# Patient Record
Sex: Male | Born: 1935 | Race: White | Hispanic: No | Marital: Married | Smoking: Former smoker
Health system: Southern US, Community
[De-identification: ages and names within clinical notes are randomized; demographics above are authoritative.]

## PROBLEM LIST (undated history)

## (undated) DIAGNOSIS — J449 Chronic obstructive pulmonary disease, unspecified: Secondary | ICD-10-CM

## (undated) DIAGNOSIS — R002 Palpitations: Secondary | ICD-10-CM

## (undated) DIAGNOSIS — H04129 Dry eye syndrome of unspecified lacrimal gland: Secondary | ICD-10-CM

## (undated) DIAGNOSIS — I359 Nonrheumatic aortic valve disorder, unspecified: Secondary | ICD-10-CM

## (undated) DIAGNOSIS — H9190 Unspecified hearing loss, unspecified ear: Secondary | ICD-10-CM

## (undated) DIAGNOSIS — I2510 Atherosclerotic heart disease of native coronary artery without angina pectoris: Secondary | ICD-10-CM

## (undated) DIAGNOSIS — I25810 Atherosclerosis of coronary artery bypass graft(s) without angina pectoris: Secondary | ICD-10-CM

## (undated) HISTORY — DX: Dry eye syndrome of unspecified lacrimal gland: H04.129

## (undated) HISTORY — DX: Palpitations: R00.2

## (undated) HISTORY — DX: Chronic obstructive pulmonary disease, unspecified: J44.9

## (undated) HISTORY — DX: Nonrheumatic aortic valve disorder, unspecified: I35.9

## (undated) HISTORY — DX: Atherosclerotic heart disease of native coronary artery without angina pectoris: I25.10

## (undated) HISTORY — DX: Atherosclerosis of coronary artery bypass graft(s) without angina pectoris: I25.810

---

## 1999-10-12 HISTORY — PX: CORONARY ARTERY BYPASS GRAFT: SHX141

## 1999-10-12 HISTORY — PX: AORTIC VALVE REPLACEMENT: SHX41

## 2000-06-28 ENCOUNTER — Ambulatory Visit (HOSPITAL_COMMUNITY): Admission: RE | Admit: 2000-06-28 | Discharge: 2000-06-28 | Payer: Self-pay | Admitting: Cardiovascular Disease

## 2000-07-12 ENCOUNTER — Ambulatory Visit (HOSPITAL_COMMUNITY): Admission: RE | Admit: 2000-07-12 | Discharge: 2000-07-12 | Payer: Self-pay | Admitting: Surgery

## 2000-08-01 ENCOUNTER — Encounter: Payer: Self-pay | Admitting: Surgery

## 2000-08-03 ENCOUNTER — Encounter: Payer: Self-pay | Admitting: Surgery

## 2000-08-03 ENCOUNTER — Inpatient Hospital Stay (HOSPITAL_COMMUNITY): Admission: RE | Admit: 2000-08-03 | Discharge: 2000-08-09 | Payer: Self-pay | Admitting: Surgery

## 2000-08-03 ENCOUNTER — Encounter (INDEPENDENT_AMBULATORY_CARE_PROVIDER_SITE_OTHER): Payer: Self-pay | Admitting: *Deleted

## 2000-08-04 ENCOUNTER — Encounter: Payer: Self-pay | Admitting: Surgery

## 2000-08-05 ENCOUNTER — Encounter: Payer: Self-pay | Admitting: Surgery

## 2003-10-24 ENCOUNTER — Ambulatory Visit (HOSPITAL_COMMUNITY): Admission: RE | Admit: 2003-10-24 | Discharge: 2003-10-24 | Payer: Self-pay | Admitting: Surgery

## 2004-08-12 ENCOUNTER — Ambulatory Visit: Payer: Self-pay | Admitting: *Deleted

## 2004-09-02 ENCOUNTER — Ambulatory Visit: Payer: Self-pay | Admitting: Cardiology

## 2004-09-30 ENCOUNTER — Ambulatory Visit: Payer: Self-pay | Admitting: *Deleted

## 2004-10-28 ENCOUNTER — Ambulatory Visit: Payer: Self-pay | Admitting: Cardiology

## 2004-11-25 ENCOUNTER — Ambulatory Visit: Payer: Self-pay | Admitting: Cardiovascular Disease

## 2004-12-23 ENCOUNTER — Ambulatory Visit: Payer: Self-pay | Admitting: *Deleted

## 2005-01-04 ENCOUNTER — Ambulatory Visit: Payer: Self-pay | Admitting: Cardiology

## 2005-01-20 ENCOUNTER — Ambulatory Visit: Payer: Self-pay | Admitting: Internal Medicine

## 2005-02-17 ENCOUNTER — Ambulatory Visit: Payer: Self-pay | Admitting: Cardiology

## 2005-03-16 ENCOUNTER — Ambulatory Visit: Payer: Self-pay | Admitting: *Deleted

## 2005-03-24 ENCOUNTER — Ambulatory Visit: Payer: Self-pay | Admitting: Cardiovascular Disease

## 2005-04-14 ENCOUNTER — Ambulatory Visit: Payer: Self-pay | Admitting: Internal Medicine

## 2005-05-12 ENCOUNTER — Ambulatory Visit: Payer: Self-pay | Admitting: *Deleted

## 2005-05-12 ENCOUNTER — Ambulatory Visit: Payer: Self-pay | Admitting: Cardiovascular Disease

## 2005-05-18 ENCOUNTER — Ambulatory Visit: Payer: Self-pay | Admitting: Internal Medicine

## 2005-06-10 ENCOUNTER — Ambulatory Visit: Payer: Self-pay | Admitting: Internal Medicine

## 2005-07-06 ENCOUNTER — Ambulatory Visit: Payer: Self-pay | Admitting: *Deleted

## 2005-08-04 ENCOUNTER — Ambulatory Visit: Payer: Self-pay | Admitting: Cardiology

## 2005-08-24 ENCOUNTER — Ambulatory Visit: Payer: Self-pay | Admitting: Internal Medicine

## 2005-09-22 ENCOUNTER — Ambulatory Visit: Payer: Self-pay | Admitting: Cardiology

## 2005-09-28 ENCOUNTER — Encounter: Payer: Self-pay | Admitting: Cardiovascular Disease

## 2005-09-28 ENCOUNTER — Ambulatory Visit: Payer: Self-pay | Admitting: Cardiovascular Disease

## 2005-09-28 ENCOUNTER — Ambulatory Visit: Payer: Self-pay

## 2005-10-21 ENCOUNTER — Ambulatory Visit: Payer: Self-pay | Admitting: Cardiology

## 2005-11-18 ENCOUNTER — Ambulatory Visit: Payer: Self-pay | Admitting: *Deleted

## 2005-12-21 ENCOUNTER — Ambulatory Visit: Payer: Self-pay | Admitting: Cardiology

## 2006-01-05 ENCOUNTER — Ambulatory Visit: Payer: Self-pay | Admitting: *Deleted

## 2006-01-20 ENCOUNTER — Ambulatory Visit: Payer: Self-pay | Admitting: Cardiology

## 2006-02-15 ENCOUNTER — Ambulatory Visit: Payer: Self-pay | Admitting: *Deleted

## 2006-03-16 ENCOUNTER — Ambulatory Visit: Payer: Self-pay | Admitting: Cardiology

## 2006-03-29 ENCOUNTER — Ambulatory Visit: Payer: Self-pay | Admitting: Cardiovascular Disease

## 2006-04-14 ENCOUNTER — Ambulatory Visit: Payer: Self-pay | Admitting: Cardiology

## 2006-04-28 ENCOUNTER — Ambulatory Visit: Payer: Self-pay | Admitting: Cardiology

## 2006-05-10 ENCOUNTER — Ambulatory Visit: Payer: Self-pay | Admitting: Cardiology

## 2006-05-31 ENCOUNTER — Ambulatory Visit: Payer: Self-pay | Admitting: Cardiology

## 2006-06-29 ENCOUNTER — Ambulatory Visit: Payer: Self-pay | Admitting: Cardiology

## 2006-07-19 ENCOUNTER — Ambulatory Visit: Payer: Self-pay | Admitting: Internal Medicine

## 2006-07-26 ENCOUNTER — Ambulatory Visit: Payer: Self-pay | Admitting: Family Medicine

## 2006-07-28 ENCOUNTER — Ambulatory Visit: Payer: Self-pay | Admitting: Internal Medicine

## 2006-08-11 ENCOUNTER — Ambulatory Visit: Payer: Self-pay | Admitting: Cardiology

## 2006-09-06 ENCOUNTER — Ambulatory Visit: Payer: Self-pay | Admitting: Cardiology

## 2006-09-13 ENCOUNTER — Ambulatory Visit: Payer: Self-pay | Admitting: Family Medicine

## 2006-09-29 ENCOUNTER — Ambulatory Visit: Payer: Self-pay | Admitting: Cardiology

## 2006-09-29 ENCOUNTER — Ambulatory Visit: Payer: Self-pay | Admitting: Cardiovascular Disease

## 2006-10-31 ENCOUNTER — Ambulatory Visit: Payer: Self-pay | Admitting: Cardiology

## 2006-11-30 ENCOUNTER — Ambulatory Visit: Payer: Self-pay | Admitting: Cardiovascular Disease

## 2006-12-21 ENCOUNTER — Ambulatory Visit: Payer: Self-pay | Admitting: Cardiology

## 2007-01-18 ENCOUNTER — Ambulatory Visit: Payer: Self-pay | Admitting: Internal Medicine

## 2007-01-26 ENCOUNTER — Ambulatory Visit: Payer: Self-pay | Admitting: Family Medicine

## 2007-02-13 ENCOUNTER — Ambulatory Visit: Payer: Self-pay | Admitting: Cardiovascular Disease

## 2007-03-01 ENCOUNTER — Ambulatory Visit: Payer: Self-pay | Admitting: Cardiology

## 2007-03-09 ENCOUNTER — Ambulatory Visit: Payer: Self-pay | Admitting: Family Medicine

## 2007-03-15 ENCOUNTER — Ambulatory Visit: Payer: Self-pay | Admitting: Cardiology

## 2007-03-29 ENCOUNTER — Ambulatory Visit: Payer: Self-pay | Admitting: Cardiovascular Disease

## 2007-04-12 ENCOUNTER — Ambulatory Visit: Payer: Self-pay | Admitting: Cardiology

## 2007-05-10 ENCOUNTER — Ambulatory Visit: Payer: Self-pay | Admitting: Cardiology

## 2007-06-07 ENCOUNTER — Ambulatory Visit: Payer: Self-pay | Admitting: Cardiology

## 2007-07-05 ENCOUNTER — Ambulatory Visit: Payer: Self-pay | Admitting: Internal Medicine

## 2007-08-30 ENCOUNTER — Ambulatory Visit: Payer: Self-pay | Admitting: Cardiology

## 2007-09-27 ENCOUNTER — Ambulatory Visit: Payer: Self-pay | Admitting: Cardiovascular Disease

## 2007-10-25 ENCOUNTER — Ambulatory Visit: Payer: Self-pay | Admitting: Cardiovascular Disease

## 2007-11-21 ENCOUNTER — Ambulatory Visit: Payer: Self-pay | Admitting: Cardiovascular Disease

## 2007-11-21 LAB — CONVERTED CEMR LAB
Basophils Relative: 0.7 % (ref 0.0–1.0)
Bilirubin, Direct: 0.1 mg/dL (ref 0.0–0.3)
CO2: 26 meq/L (ref 19–32)
Creatinine, Ser: 1.3 mg/dL (ref 0.4–1.5)
Eosinophils Relative: 2.5 % (ref 0.0–5.0)
GFR calc Af Amer: 69 mL/min
Glucose, Bld: 102 mg/dL — ABNORMAL HIGH (ref 70–99)
HCT: 39.1 % (ref 39.0–52.0)
Hemoglobin: 13.3 g/dL (ref 13.0–17.0)
Lymphocytes Relative: 17.5 % (ref 12.0–46.0)
Monocytes Absolute: 0.7 10*3/uL (ref 0.2–0.7)
Monocytes Relative: 8.6 % (ref 3.0–11.0)
Neutro Abs: 5.8 10*3/uL (ref 1.4–7.7)
Neutrophils Relative %: 70.7 % (ref 43.0–77.0)
Potassium: 4.2 meq/L (ref 3.5–5.1)
Sodium: 139 meq/L (ref 135–145)
TSH: 2.96 microintl units/mL (ref 0.35–5.50)
Total Bilirubin: 0.8 mg/dL (ref 0.3–1.2)
Total Protein: 7.1 g/dL (ref 6.0–8.3)
WBC: 8.2 10*3/uL (ref 4.5–10.5)

## 2007-12-20 ENCOUNTER — Ambulatory Visit: Payer: Self-pay | Admitting: Cardiology

## 2008-01-17 ENCOUNTER — Ambulatory Visit: Payer: Self-pay | Admitting: Cardiovascular Disease

## 2008-02-14 ENCOUNTER — Ambulatory Visit: Payer: Self-pay | Admitting: Internal Medicine

## 2008-03-13 ENCOUNTER — Ambulatory Visit: Payer: Self-pay | Admitting: Cardiology

## 2008-04-10 ENCOUNTER — Ambulatory Visit: Payer: Self-pay | Admitting: Internal Medicine

## 2008-05-08 ENCOUNTER — Ambulatory Visit: Payer: Self-pay | Admitting: Cardiology

## 2008-06-10 ENCOUNTER — Ambulatory Visit: Payer: Self-pay | Admitting: Internal Medicine

## 2008-06-10 ENCOUNTER — Ambulatory Visit: Payer: Self-pay | Admitting: Cardiovascular Disease

## 2008-07-10 ENCOUNTER — Ambulatory Visit: Payer: Self-pay | Admitting: Cardiology

## 2008-07-24 ENCOUNTER — Ambulatory Visit: Payer: Self-pay | Admitting: Cardiology

## 2008-08-12 ENCOUNTER — Ambulatory Visit: Payer: Self-pay | Admitting: Cardiovascular Disease

## 2008-09-11 ENCOUNTER — Ambulatory Visit: Payer: Self-pay | Admitting: Internal Medicine

## 2008-10-09 ENCOUNTER — Ambulatory Visit: Payer: Self-pay | Admitting: Internal Medicine

## 2008-10-09 DIAGNOSIS — I25810 Atherosclerosis of coronary artery bypass graft(s) without angina pectoris: Secondary | ICD-10-CM | POA: Insufficient documentation

## 2008-10-09 DIAGNOSIS — Z8679 Personal history of other diseases of the circulatory system: Secondary | ICD-10-CM | POA: Insufficient documentation

## 2008-10-09 DIAGNOSIS — Z951 Presence of aortocoronary bypass graft: Secondary | ICD-10-CM | POA: Insufficient documentation

## 2008-10-09 DIAGNOSIS — J4489 Other specified chronic obstructive pulmonary disease: Secondary | ICD-10-CM | POA: Insufficient documentation

## 2008-10-09 DIAGNOSIS — Z952 Presence of prosthetic heart valve: Secondary | ICD-10-CM

## 2008-10-09 DIAGNOSIS — H04129 Dry eye syndrome of unspecified lacrimal gland: Secondary | ICD-10-CM | POA: Insufficient documentation

## 2008-10-09 DIAGNOSIS — J449 Chronic obstructive pulmonary disease, unspecified: Secondary | ICD-10-CM

## 2008-10-09 HISTORY — DX: Other specified chronic obstructive pulmonary disease: J44.89

## 2008-10-09 HISTORY — DX: Chronic obstructive pulmonary disease, unspecified: J44.9

## 2008-10-09 HISTORY — DX: Atherosclerosis of coronary artery bypass graft(s) without angina pectoris: I25.810

## 2008-10-09 HISTORY — DX: Dry eye syndrome of unspecified lacrimal gland: H04.129

## 2008-11-06 ENCOUNTER — Ambulatory Visit: Payer: Self-pay | Admitting: Cardiology

## 2008-12-03 ENCOUNTER — Ambulatory Visit: Payer: Self-pay | Admitting: Cardiovascular Disease

## 2009-01-01 ENCOUNTER — Ambulatory Visit: Payer: Self-pay | Admitting: Cardiovascular Disease

## 2009-01-01 ENCOUNTER — Ambulatory Visit: Payer: Self-pay | Admitting: Cardiology

## 2009-01-01 ENCOUNTER — Encounter: Payer: Self-pay | Admitting: Cardiovascular Disease

## 2009-01-22 ENCOUNTER — Encounter: Admission: RE | Admit: 2009-01-22 | Discharge: 2009-01-22 | Payer: Self-pay | Admitting: Cardiovascular Disease

## 2009-01-22 ENCOUNTER — Encounter: Payer: Self-pay | Admitting: Cardiovascular Disease

## 2009-01-22 ENCOUNTER — Ambulatory Visit: Payer: Self-pay | Admitting: Internal Medicine

## 2009-01-22 ENCOUNTER — Ambulatory Visit: Payer: Self-pay

## 2009-01-22 DIAGNOSIS — R0989 Other specified symptoms and signs involving the circulatory and respiratory systems: Secondary | ICD-10-CM | POA: Insufficient documentation

## 2009-01-22 DIAGNOSIS — R0609 Other forms of dyspnea: Secondary | ICD-10-CM

## 2009-02-04 ENCOUNTER — Telehealth: Payer: Self-pay | Admitting: Cardiovascular Disease

## 2009-02-19 ENCOUNTER — Ambulatory Visit: Payer: Self-pay | Admitting: Cardiology

## 2009-02-26 ENCOUNTER — Ambulatory Visit: Payer: Self-pay | Admitting: Internal Medicine

## 2009-02-28 ENCOUNTER — Telehealth (INDEPENDENT_AMBULATORY_CARE_PROVIDER_SITE_OTHER): Payer: Self-pay | Admitting: *Deleted

## 2009-03-11 ENCOUNTER — Encounter: Payer: Self-pay | Admitting: *Deleted

## 2009-03-12 ENCOUNTER — Ambulatory Visit: Payer: Self-pay | Admitting: Cardiology

## 2009-03-12 ENCOUNTER — Encounter (INDEPENDENT_AMBULATORY_CARE_PROVIDER_SITE_OTHER): Payer: Self-pay | Admitting: Cardiology

## 2009-03-12 LAB — CONVERTED CEMR LAB: Protime: 22.4

## 2009-03-24 ENCOUNTER — Ambulatory Visit: Payer: Self-pay | Admitting: Internal Medicine

## 2009-04-08 ENCOUNTER — Ambulatory Visit: Payer: Self-pay

## 2009-04-16 ENCOUNTER — Encounter: Payer: Self-pay | Admitting: *Deleted

## 2009-04-22 ENCOUNTER — Telehealth (INDEPENDENT_AMBULATORY_CARE_PROVIDER_SITE_OTHER): Payer: Self-pay | Admitting: *Deleted

## 2009-04-22 DIAGNOSIS — E278 Other specified disorders of adrenal gland: Secondary | ICD-10-CM | POA: Insufficient documentation

## 2009-04-23 ENCOUNTER — Ambulatory Visit: Payer: Self-pay | Admitting: Internal Medicine

## 2009-04-23 ENCOUNTER — Ambulatory Visit: Payer: Self-pay | Admitting: Cardiovascular Disease

## 2009-04-23 DIAGNOSIS — I2510 Atherosclerotic heart disease of native coronary artery without angina pectoris: Secondary | ICD-10-CM

## 2009-04-23 HISTORY — DX: Atherosclerotic heart disease of native coronary artery without angina pectoris: I25.10

## 2009-04-24 LAB — CONVERTED CEMR LAB
BUN: 14 mg/dL (ref 6–23)
Chloride: 107 meq/L (ref 96–112)
GFR calc non Af Amer: 68.99 mL/min (ref >60)
Glucose, Bld: 102 mg/dL — ABNORMAL HIGH (ref 70–99)
Potassium: 4.1 meq/L (ref 3.5–5.1)
Sodium: 140 meq/L (ref 135–145)

## 2009-04-29 ENCOUNTER — Encounter: Admission: RE | Admit: 2009-04-29 | Discharge: 2009-04-29 | Payer: Self-pay | Admitting: Cardiovascular Disease

## 2009-05-21 ENCOUNTER — Ambulatory Visit: Payer: Self-pay | Admitting: Internal Medicine

## 2009-05-21 LAB — CONVERTED CEMR LAB: POC INR: 3.5

## 2009-06-30 ENCOUNTER — Ambulatory Visit: Payer: Self-pay | Admitting: Cardiology

## 2009-07-15 ENCOUNTER — Ambulatory Visit: Payer: Self-pay | Admitting: Cardiovascular Disease

## 2009-07-30 ENCOUNTER — Ambulatory Visit: Payer: Self-pay | Admitting: Internal Medicine

## 2009-07-30 LAB — CONVERTED CEMR LAB: POC INR: 3.6

## 2009-08-27 ENCOUNTER — Ambulatory Visit: Payer: Self-pay | Admitting: Cardiology

## 2009-08-27 LAB — CONVERTED CEMR LAB: POC INR: 3.7

## 2009-09-24 ENCOUNTER — Ambulatory Visit: Payer: Self-pay | Admitting: Cardiology

## 2009-09-24 LAB — CONVERTED CEMR LAB: POC INR: 3

## 2009-10-22 ENCOUNTER — Ambulatory Visit: Payer: Self-pay | Admitting: Internal Medicine

## 2009-11-19 ENCOUNTER — Ambulatory Visit: Payer: Self-pay | Admitting: Cardiology

## 2009-12-17 ENCOUNTER — Ambulatory Visit: Payer: Self-pay | Admitting: Cardiovascular Disease

## 2010-01-14 ENCOUNTER — Ambulatory Visit: Payer: Self-pay | Admitting: Cardiology

## 2010-01-14 ENCOUNTER — Ambulatory Visit: Payer: Self-pay | Admitting: Cardiovascular Disease

## 2010-01-14 LAB — CONVERTED CEMR LAB: POC INR: 2.6

## 2010-02-11 ENCOUNTER — Ambulatory Visit: Payer: Self-pay | Admitting: Cardiology

## 2010-02-11 LAB — CONVERTED CEMR LAB: POC INR: 2.6

## 2010-03-11 ENCOUNTER — Ambulatory Visit: Payer: Self-pay | Admitting: Cardiology

## 2010-03-11 LAB — CONVERTED CEMR LAB: POC INR: 2.6

## 2010-04-08 ENCOUNTER — Ambulatory Visit: Payer: Self-pay | Admitting: Cardiology

## 2010-04-08 LAB — CONVERTED CEMR LAB: POC INR: 3

## 2010-05-05 ENCOUNTER — Ambulatory Visit: Payer: Self-pay | Admitting: Cardiology

## 2010-05-05 LAB — CONVERTED CEMR LAB: POC INR: 3

## 2010-06-03 ENCOUNTER — Ambulatory Visit: Payer: Self-pay | Admitting: Internal Medicine

## 2010-06-03 LAB — CONVERTED CEMR LAB: POC INR: 2.7

## 2010-07-01 ENCOUNTER — Ambulatory Visit: Payer: Self-pay | Admitting: Cardiology

## 2010-07-31 ENCOUNTER — Ambulatory Visit: Payer: Self-pay | Admitting: Cardiovascular Disease

## 2010-07-31 ENCOUNTER — Ambulatory Visit: Payer: Self-pay | Admitting: Internal Medicine

## 2010-07-31 LAB — CONVERTED CEMR LAB: POC INR: 2.5

## 2010-08-19 ENCOUNTER — Encounter: Payer: Self-pay | Admitting: Cardiovascular Disease

## 2010-08-26 ENCOUNTER — Ambulatory Visit: Payer: Self-pay | Admitting: Cardiology

## 2010-09-23 ENCOUNTER — Ambulatory Visit: Payer: Self-pay | Admitting: Cardiology

## 2010-09-23 LAB — CONVERTED CEMR LAB: POC INR: 2.1

## 2010-10-14 ENCOUNTER — Ambulatory Visit: Admission: RE | Admit: 2010-10-14 | Discharge: 2010-10-14 | Payer: Self-pay | Source: Home / Self Care

## 2010-10-14 LAB — CONVERTED CEMR LAB: POC INR: 2.7

## 2010-11-10 NOTE — Medication Information (Signed)
Summary: rov/sp  Anticoagulant Therapy  Managed by: [name], PharmD Referring MD: Charlton Haws MD PCP: Dr. 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 no    Recent/future dental: no  Any missed doses?: no       Is patient compliant with meds? yes       Allergies: 1)  ! Penicillin  Anticoagulation Management History:      The patient is taking warfarin and comes in today for a routine follow up visit.  Positive risk factors for bleeding include an age of 75 years or older.  The bleeding index is 'intermediate risk'.  Positive CHADS2 values include Age > 58 years old.  The start date was 08/04/2000.  Anticoagulation responsible provider: Juanda Chance MD, Smitty Cords.  INR POC: 3.0.  Cuvette Lot#: [id number].  Exp: 07/2011.    Anticoagulation Management Assessment/Plan:      The patient's current anticoagulation dose is Warfarin sodium 5 mg tabs: Use as directed by Anticoagulation Clinic.  The target INR is 2.5 - 3.5.  The next INR is due 06/03/2010.  Anticoagulation instructions were given to patient.  Results were reviewed/authorized by [name], PharmD.  He was notified by Dillard Cannon.         Prior Anticoagulation Instructions: INR 3.0  Continue same dose fo 1 tablet every day except 1 1/2 tablets on Monday, Wednesday and Friday.   Current Anticoagulation Instructions: INR 3.0  Continue same dose of 1 tab daily except for 1.5 tabs on Monday, Wednesday, and Friday.  Re-check in 4 weeks.

## 2010-11-10 NOTE — Medication Information (Signed)
Summary: rov/ewj  Anticoagulant Therapy  Managed by: Cloyde Reams, RN, BSN Referring MD: Charlton Haws MD PCP: Dr. 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 no    Recent/future dental: no  Any missed doses?: no       Is patient compliant with meds? yes       Allergies (verified): 1)  ! Penicillin  Anticoagulation Management History:      The patient is taking warfarin and comes in today for a routine follow up visit.  Positive risk factors for bleeding include an age of 75 years or older.  The bleeding index is 'intermediate risk'.  Positive CHADS2 values include Age > 75 years old.  The start date was 08/04/2000.  Anticoagulation responsible provider: Jens Som MD, Arlys John.  INR POC: 1.9.  Cuvette Lot#: [id number].  Exp: 01/2011.    Anticoagulation Management Assessment/Plan:      The patient's current anticoagulation dose is Warfarin sodium 5 mg tabs: Use as directed by Anticoagulation Clinic.  The target INR is 2.5 - 3.5.  The next INR is due 12/17/2009.  Anticoagulation instructions were given to patient.  Results were reviewed/authorized by Cloyde Reams, RN, BSN.  He was notified by Cloyde Reams RN.         Prior Anticoagulation Instructions: INR 2.7  Continue on same dosage 1 tablet daily except 1.5 tablets on Mondays and Fridays.  Recheck in 4 weeks.    Current Anticoagulation Instructions: INR 1.9  Take 2 tablets today, then start taking 1 tablet daily except 1.5 tablets on Mondays, Wednesdays, and Fridays.  Recheck in 3 weeks.

## 2010-11-10 NOTE — Medication Information (Signed)
Summary: rov/sl  Anticoagulant Therapy  Managed by: [name], PharmD Referring MD: Charlton Haws MD PCP: Dr. 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 no    Recent/future dental: no  Any missed doses?: no       Is patient compliant with meds? yes       Allergies: 1)  ! Penicillin  Anticoagulation Management History:      The patient is taking warfarin and comes in today for a routine follow up visit.  Positive risk factors for bleeding include an age of 69 years or older.  The bleeding index is 'intermediate risk'.  Positive CHADS2 values include Age > 47 years old.  The start date was 08/04/2000.  Anticoagulation responsible Josedaniel Haye: Juanda Chance MD, Smitty Cords.  INR POC: 2.5.  Cuvette Lot#: [id number].  Exp: 08/2011.    Anticoagulation Management Assessment/Plan:      The patient's current anticoagulation dose is Warfarin sodium 5 mg tabs: Use as directed by Anticoagulation Clinic.  The target INR is 2.5 - 3.5.  The next INR is due 07/31/2010.  Anticoagulation instructions were given to patient.  Results were reviewed/authorized by [name], PharmD.  He was notified by Kennieth Francois.         Prior Anticoagulation Instructions: INR 2.7  Continue taking Coumadin 1 tab (5 mg) on Sun, Tues, Thur, Sat and Coumadin 1.5 tabs (7.5 mg) on Mon, Wed, Fri. Return to clinic in 4 weeks.   Current Anticoagulation Instructions: INR 2.5  Continue taking one tablet every day except for one and one-half tablets on Monday, Wednesday, and Friday.  Recheck in four weeks.

## 2010-11-10 NOTE — Assessment & Plan Note (Signed)
Summary: F6M/DM   Referring Sharisa Toves:  Dr. Eden Emms Primary Laterica Matarazzo:  Dr. [name]  CC:  sob.  History of Present Illness: [name] is seen today in F/U for dyspnea, AVR, CAD and anticoagulaiton.  He had a St Jude AVR and SVG to RCA by Dr. Laneta Simmers in 2001.  He has done well.  He has had progressive dyspnea from previous smoking and interstitial lung disease.  he sees Dr. Sherene Sires and has had his inhalers changed.  he needs a flu shor and possibly pneumovax.  He has not had any SSCP, palptitaitons, PND or orthopnea.  His dypnsea is significant but improved.  he is not coughing and has no hemoptysis.  Shelby Dubin follows his coumadin and he has been Rx without bleeding diathesis.  HIs last echo on 01/2009 showed normal LV function and normal AVR with mean gradient and peak gradient of 22 mmHg.  He has pain in the thighs with ambulation which is not claudication as his pulses are excellent.  Should F/U with primary regarding hips.  SOB from interstitial fibrosis is stable  Current Problems (verified): 1)  Pre-operative Cardiovascular Examination  (ICD-V72.81) 2)  Coronary Atherosclerosis Native Coronary Artery  (ICD-414.01) 3)  Adrenal Mass  (ICD-255.8) 4)  Dyspnea  (ICD-786.09) 5)  Coumadin Therapy  (ICD-V58.61) 6)  Atherosclerosis, Hx of  (ICD-V12.50) 7)  Palpitations, Hx of  (ICD-V12.50) 8)  Coronary Artery Bypass Graft, Hx of  (ICD-V45.81) 9)  Dry Eye Syndrome  (ICD-375.15) 10)  COPD  (ICD-496) 11)  Aortic Valve Replacement, Hx of  (ICD-V43.3) 12)  Cad, Artery Bypass Graft  (ICD-414.04)  Current Medications (verified): 1)  Metoprolol Tartrate 50 Mg Tabs (Metoprolol Tartrate) .... 1/2 Tab By Mouth Once Daily 2)  Aspirin 81 Mg  Tabs (Aspirin) .Marland Kitchen.. 1 Tab By Mouth Once Daily 3)  Lumigan 0.03 % Soln (Bimatoprost) .Marland Kitchen.. 1 Drop Both Eyes Qhs 4)  Spiriva Handihaler 18 Mcg  Caps (Tiotropium Bromide Monohydrate) .... One Capsule Each Am Punch It Twice and Take Two Deep Drags 5)  Warfarin Sodium 5  Mg Tabs (Warfarin Sodium) .... Use As Directed By Anticoagulation Clinic 6)  Alphagan P 0.1 % Soln (Brimonidine Tartrate) .Marland Kitchen.. 1 Drop Both Eyes  Allergies (verified): 1)  ! Penicillin  Past History:  Past Medical History: Last updated: 03/24/2009 ATHEROSCLEROSIS, HX OF (ICD-V12.50) PALPITATIONS, HX OF (ICD-V12.50) DRY EYE SYNDROME (ICD-375.15) COPD (ICD-496)    - PFT's 02/26/09  FEV1 1.36 (48%) ratio 29  7% improvment after B2, DLC0 56%    - CT 01/01/09 emphysema with mild pleural thickening. NO ILD CAD, ARTERY BYPASS GRAFT (ICD-414.04) AVR  Past Surgical History: Last updated: 10/09/2008 CABG/AVR:  08/03/2000    OPERATION PERFORMED:  Median sternotomy, extracorporeal circulation, coronary artery bypass graft surgery x 1 using a saphenous vein graft to the right coronary artery and aortic valve replacement using a 23 mm St. Jude mechanical valve. Back surgery 1965  Family History: Last updated: 18-Feb-2009 Father:Died at age 59 of heart attack. Mother: Died at age 88 of stroke  Family History of Hypertension:  Family History of Cancer:  Brother died at age 20 colon cancer. Emphysema- Brother  Social History: Last updated: Feb 18, 2009 Married with children Former smoker.  Quit in 2001.  Smoked "all my life" up to 2 ppd. Activity limited by dyspnea Non-drinker Retired  Review of Systems       Denies fever, malais, weight loss, blurry vision, decreased visual acuity, cough, sputum,  hemoptysis, pleuritic pain, palpitaitons, heartburn, abdominal pain, melena,  lower extremity edema, claudication, or rash.   Vital Signs:  Patient profile:   77 year old male Height:      74 inches Weight:      170 pounds BMI:     21.91 Pulse rate:   64 / minute Resp:     12 per minute BP sitting:   127 / 75  (left arm)  Vitals Entered By: Kem Parkinson (July 31, 2010 10:06 AM)  Physical Exam  General:  Affect appropriate Healthy:  appears stated age HEENT: normal Neck  supple with no adenopathy JVP normal no bruits no thyromegaly Lungs interstitial crackles bilaterally  no wheezing and good diaphragmatic motion Heart:  S1/S2  click with SEM no AR  murmur,rub, gallop or click PMI normal Abdomen: benighn, BS positve, no tenderness, no AAA no bruit.  No HSM or HJR Distal pulses intact with no bruits No edema Neuro non-focal Skin warm and dry    Impression & Recommendations:  Problem # 1:  CORONARY ATHEROSCLEROSIS NATIVE CORONARY ARTERY (ICD-414.01) Stable with no angina.   His updated medication list for this problem includes:    Metoprolol Tartrate 50 Mg Tabs (Metoprolol tartrate) .Marland Kitchen... 1/2 tab by mouth once daily    Aspirin 81 Mg Tabs (Aspirin) .Marland Kitchen... 1 tab by mouth once daily    Warfarin Sodium 5 Mg Tabs (Warfarin sodium) ..... Use as directed by anticoagulation clinic  Problem # 2:  DYSPNEA (ICD-786.09) Interstitial fibrosis Needs flu shot.  F/U Wert His updated medication list for this problem includes:    Metoprolol Tartrate 50 Mg Tabs (Metoprolol tartrate) .Marland Kitchen... 1/2 tab by mouth once daily    Aspirin 81 Mg Tabs (Aspirin) .Marland Kitchen... 1 tab by mouth once daily  Problem # 5:  COUMADIN THERAPY (ICD-V58.61) F/U clinic monthly stable with no bleeding  Problem # 6:  AORTIC VALVE REPLACEMENT, HX OF (ICD-V43.3) Normal exam.  Continue SBE prophylaxis  Patient Instructions: 1)  Your physician recommends that you schedule a follow-up appointment in: 6 MONTHS WITH DR Eden Emms 2)  Your physician recommends that you continue on your current medications as directed. Please refer to the Current Medication list given to you today.   EKG Report  Procedure date:  07/31/2010  Findings:      NSR 64 PAC Nonspecific ST/T changes Otherwise normal

## 2010-11-10 NOTE — Medication Information (Signed)
Summary: rov/sp  Anticoagulant Therapy  Managed by: Elaina Pattee, PharmD Referring MD: Charlton Haws MD PCP: Dr. 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 no    Recent/future dental: no  Any missed doses?: no       Is patient compliant with meds? yes       Allergies: 1)  ! Penicillin  Anticoagulation Management History:      The patient is taking warfarin and comes in today for a routine follow up visit.  Positive risk factors for bleeding include an age of 75 years or older.  The bleeding index is 'intermediate risk'.  Positive CHADS2 values include Age > 80 years old.  The start date was 08/04/2000.  Anticoagulation responsible provider: Riley Kill MD, Maisie Fus.  INR POC: 2.6.  Cuvette Lot#: [id number].  Exp: 05/2011.    Anticoagulation Management Assessment/Plan:      The patient's current anticoagulation dose is Warfarin sodium 5 mg tabs: Use as directed by Anticoagulation Clinic.  The target INR is 2.5 - 3.5.  The next INR is due 04/08/2010.  Anticoagulation instructions were given to patient.  Results were reviewed/authorized by Elaina Pattee, PharmD.  He was notified by Elaina Pattee, PharmD.         Prior Anticoagulation Instructions: INR 2.6  Continue same dose of 1 tablet every day except 1 1/2 tablets on Monday, Wednesday and Friday  Current Anticoagulation Instructions: INR 2.6. Take 1 tablet daily except 1.5 tablets on Mon, Wed, Fri. Recheck in 4 weeks.

## 2010-11-10 NOTE — Medication Information (Signed)
Summary: rov/ewj  Anticoagulant Therapy  Managed by: Cloyde Reams, RN, BSN Referring MD: Charlton Haws MD PCP: Dr. 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    Recent/future hospitalizations: no    Any changes in medication regimen? no    Recent/future dental: no  Any missed doses?: no       Is patient compliant with meds? yes       Allergies (verified): 1)  ! Penicillin  Anticoagulation Management History:      The patient is taking warfarin and comes in today for a routine follow up visit.  Positive risk factors for bleeding include an age of 75 years or older.  The bleeding index is 'intermediate risk'.  Positive CHADS2 values include Age > 41 years old.  The start date was 08/04/2000.  Anticoagulation responsible provider: Eden Emms MD, Theron Arista.  INR POC: 2.5.  Cuvette Lot#: [id number].  Exp: 02/2011.    Anticoagulation Management Assessment/Plan:      The patient's current anticoagulation dose is Warfarin sodium 5 mg tabs: Use as directed by Anticoagulation Clinic.  The target INR is 2.5 - 3.5.  The next INR is due 01/14/2010.  Anticoagulation instructions were given to patient.  Results were reviewed/authorized by Cloyde Reams, RN, BSN.  He was notified by Cloyde Reams RN.         Prior Anticoagulation Instructions: INR 1.9  Take 2 tablets today, then start taking 1 tablet daily except 1.5 tablets on Mondays, Wednesdays, and Fridays.  Recheck in 3 weeks.    Current Anticoagulation Instructions: INR 2.5  Continue on same dosage 1 tablet daily except 1.5 tablets on Mondays, Wednesdays, and  Fridays.  Recheck in 4 weeks.

## 2010-11-10 NOTE — Medication Information (Signed)
Summary: rov/tm  Anticoagulant Therapy  Managed by: Cloyde Reams, RN, BSN Referring MD: Charlton Haws MD PCP: Dr. 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 no    Recent/future dental: no  Any missed doses?: no       Is patient compliant with meds? yes       Allergies (verified): 1)  ! Penicillin  Anticoagulation Management History:      The patient is taking warfarin and comes in today for a routine follow up visit.  Positive risk factors for bleeding include an age of 75 years or older.  The bleeding index is 'intermediate risk'.  Positive CHADS2 values include Age > 31 years old.  The start date was 08/04/2000.  Anticoagulation responsible provider: Eden Emms MD, Theron Arista.  INR POC: 2.7.  Cuvette Lot#: [id number].  Exp: 01/2011.    Anticoagulation Management Assessment/Plan:      The patient's current anticoagulation dose is Warfarin sodium 5 mg tabs: Use as directed by Anticoagulation Clinic.  The target INR is 2.5 - 3.5.  The next INR is due 11/19/2009.  Anticoagulation instructions were given to patient.  Results were reviewed/authorized by Cloyde Reams, RN, BSN.  He was notified by Cloyde Reams RN.         Prior Anticoagulation Instructions: INR 3.0 Continue 1 pill everyday except 1 1/2 pill on Mondays and Fridays. Recheck in 4 weeks.   Current Anticoagulation Instructions: INR 2.7  Continue on same dosage 1 tablet daily except 1.5 tablets on Mondays and Fridays.  Recheck in 4 weeks.

## 2010-11-10 NOTE — Medication Information (Signed)
Summary: rov/ewj  Anticoagulant Therapy  Managed by: Bethena Midget, RN, BSN Referring MD: Charlton Haws MD PCP: Dr. 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 no    Recent/future dental: no  Any missed doses?: no       Is patient compliant with meds? yes       Allergies: 1)  ! Penicillin  Anticoagulation Management History:      The patient is taking warfarin and comes in today for a routine follow up visit.  Positive risk factors for bleeding include an age of 75 years or older.  The bleeding index is 'intermediate risk'.  Positive CHADS2 values include Age > 64 years old.  The start date was 08/04/2000.  Anticoagulation responsible provider: Riley Kill MD, Maisie Fus.  INR POC: 2.6.  Cuvette Lot#: [id number].  Exp: 02/2011.    Anticoagulation Management Assessment/Plan:      The patient's current anticoagulation dose is Warfarin sodium 5 mg tabs: Use as directed by Anticoagulation Clinic.  The target INR is 2.5 - 3.5.  The next INR is due 02/11/2010.  Anticoagulation instructions were given to patient.  Results were reviewed/authorized by Bethena Midget, RN, BSN.  He was notified by Bethena Midget, RN, BSN.         Prior Anticoagulation Instructions: INR 2.5  Continue on same dosage 1 tablet daily except 1.5 tablets on Mondays, Wednesdays, and Fridays.  Recheck in 4 weeks.    Current Anticoagulation Instructions: INR 2.6 Continue 1 pill everyday except 1 1/2 pills on Mondays, Wednesdays and Fridays. Recheck in 4 weeks.

## 2010-11-10 NOTE — Medication Information (Signed)
Summary: rov/ln  Anticoagulant Therapy  Managed by: Reina Fuse, PharmD Referring MD: Charlton Haws MD PCP: Dr. 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 no    Recent/future dental: no  Any missed doses?: no       Is patient compliant with meds? yes       Allergies: 1)  ! Penicillin  Anticoagulation Management History:      The patient is taking warfarin and comes in today for a routine follow up visit.  Positive risk factors for bleeding include an age of 75 years or older.  The bleeding index is 'intermediate risk'.  Positive CHADS2 values include Age > 75 years old.  The start date was 08/04/2000.  Anticoagulation responsible provider: Allred MD, Fayrene Fearing.  INR POC: 2.7.  Cuvette Lot#: [id number].  Exp: 07/2011.    Anticoagulation Management Assessment/Plan:      The patient's current anticoagulation dose is Warfarin sodium 5 mg tabs: Use as directed by Anticoagulation Clinic.  The target INR is 2.5 - 3.5.  The next INR is due 07/01/2010.  Anticoagulation instructions were given to patient.  Results were reviewed/authorized by Reina Fuse, PharmD.  He was notified by Reina Fuse PharmD.         Prior Anticoagulation Instructions: INR 3.0  Continue same dose of 1 tab daily except for 1.5 tabs on Monday, Wednesday, and Friday.  Re-check in 4 weeks.  Current Anticoagulation Instructions: INR 2.7  Continue taking Coumadin 1 tab (5 mg) on Sun, Tues, Thur, Sat and Coumadin 1.5 tabs (7.5 mg) on Mon, Wed, Fri. Return to clinic in 4 weeks.

## 2010-11-10 NOTE — Medication Information (Signed)
Summary: rov/jm  Anticoagulant Therapy  Managed by: [name], PharmD Referring MD: Charlton Haws MD PCP: Dr. 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 no    Recent/future dental: no  Any missed doses?: no       Is patient compliant with meds? yes       Allergies: 1)  ! Penicillin  Anticoagulation Management History:      The patient is taking warfarin and comes in today for a routine follow up visit.  Positive risk factors for bleeding include an age of 75 years or older.  The bleeding index is 'intermediate risk'.  Positive CHADS2 values include Age > 75 years old.  The start date was 08/04/2000.  Anticoagulation responsible provider: Tenny Craw MD, Gunnar Fusi.  INR POC: 2.5.  Cuvette Lot#: [id number].  Exp: 09/2011.    Anticoagulation Management Assessment/Plan:      The patient's current anticoagulation dose is Warfarin sodium 5 mg tabs: Use as directed by Anticoagulation Clinic.  The target INR is 2.5 - 3.5.  The next INR is due 08/26/2010.  Anticoagulation instructions were given to patient.  Results were reviewed/authorized by [name], PharmD.  He was notified by Ilean Skill D candidate.         Prior Anticoagulation Instructions: INR 2.5  Continue taking one tablet every day except for one and one-half tablets on Monday, Wednesday, and Friday.  Recheck in four weeks.    Current Anticoagulation Instructions: INR 2.5  Continue taking same dose of 1 tablet everyday except 1 1/2 tablets on Monday, Wednesday, and Friday. Recheck in 4 weeks.

## 2010-11-10 NOTE — Medication Information (Signed)
Summary: [facility]  Anticoagulant Therapy  Managed by: Lyna Poser, PharmD Referring MD: Charlton Haws MD PCP: Dr. 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 no    Recent/future dental: no  Any missed doses?: no       Is patient compliant with meds? yes       Allergies: 1)  ! Penicillin  Anticoagulation Management History:      Positive risk factors for bleeding include an age of 75 years or older.  The bleeding index is 'intermediate risk'.  Positive CHADS2 values include Age > 62 years old.  The start date was 08/04/2000.  Anticoagulation responsible Jendaya Gossett: Tenny Craw MD, Gunnar Fusi.  INR POC: 2.7.  Exp: 09/2011.    Anticoagulation Management Assessment/Plan:      The patient's current anticoagulation dose is Warfarin sodium 5 mg tabs: Use as directed by Anticoagulation Clinic.  The target INR is 2.5 - 3.5.  The next INR is due 09/23/2010.  Anticoagulation instructions were given to patient.  Results were reviewed/authorized by Lyna Poser, PharmD.         Prior Anticoagulation Instructions: INR 2.5  Continue taking same dose of 1 tablet everyday except 1 1/2 tablets on Monday, Wednesday, and Friday. Recheck in 4 weeks.   Current Anticoagulation Instructions: INR 2.7 Continue taking 1 1/2 tablets on monday, wednesday, and friday. And 1 tablet all other days. Recheck in 4 weeks.

## 2010-11-10 NOTE — Medication Information (Signed)
Summary: rov/cb/pt has an eye appt. at 11/sl  Anticoagulant Therapy  Managed by: [name], PharmD Referring MD: Charlton Haws MD PCP: Dr. 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 Site: [address] INR POC 3.0 INR RANGE 2.5 - 3.5  Dietary changes: no    Health status changes: yes       Details: c/o of high and low BP readings.  This morning BP was somewhere around 117/55, wife reports SBP as high as 190 at night.  Pt has taken metoprolol today.  BP was normal in office today.  Instructed pt to call if he has any future problems and write BP readings down.   Bleeding/hemorrhagic complications: no    Recent/future hospitalizations: no    Any changes in medication regimen? no    Recent/future dental: no  Any missed doses?: no       Is patient compliant with meds? yes       Allergies: 1)  ! Penicillin  Anticoagulation Management History:      The patient is taking warfarin and comes in today for a routine follow up visit.  Positive risk factors for bleeding include an age of 75 years or older.  The bleeding index is 'intermediate risk'.  Positive CHADS2 values include Age > 75 years old.  The start date was 08/04/2000.  Anticoagulation responsible provider: Riley Kill MD, Maisie Fus.  INR POC: 3.0.  Cuvette Lot#: [id number].  Exp: 06/2011.    Anticoagulation Management Assessment/Plan:      The patient's current anticoagulation dose is Warfarin sodium 5 mg tabs: Use as directed by Anticoagulation Clinic.  The target INR is 2.5 - 3.5.  The next INR is due 05/05/2010.  Anticoagulation instructions were given to patient.  Results were reviewed/authorized by [name], PharmD.  He was notified by [name] PharmD.         Prior Anticoagulation Instructions: INR 2.6. Take 1 tablet daily except 1.5 tablets on Mon, Wed, Fri. Recheck in 4  weeks.  Current Anticoagulation Instructions: INR 3.0  Continue same dose fo 1 tablet every day except 1 1/2 tablets on Monday, Wednesday and Friday.

## 2010-11-10 NOTE — Assessment & Plan Note (Signed)
Summary: F6M/CY   Referring Provider:  Dr. Eden Emms Primary Provider:  Dr. [name]  CC:  no compalints.  History of Present Illness: [name] is seen today in F/U for dyspnea, AVR, CAD and anticoagulaiton.  He had a St Jude AVR and SVG to RCA by Dr. Laneta Simmers in 2001.  He has done well.  He has had progressive dyspnea from previous smoking and interstitial lung disease.  he sees Dr. Sherene Sires and has had his inhalers changed.  Marland Kitchen  He has not had any SSCP, palptitaitons, PND or orthopnea.  His dypnsea is significant but improved.  he is not coughing and has no hemoptysis.  Shelby Dubin follows his coumadin and he has been Rx without bleeding diathesis.  HIs last echo on 01/2009 showed normal LV function and normal AVR with mean gradient and peak gradient of 22 mmHg.  Current Problems (verified): 1)  Pre-operative Cardiovascular Examination  (ICD-V72.81) 2)  Coronary Atherosclerosis Native Coronary Artery  (ICD-414.01) 3)  Adrenal Mass  (ICD-255.8) 4)  Dyspnea  (ICD-786.09) 5)  Coumadin Therapy  (ICD-V58.61) 6)  Atherosclerosis, Hx of  (ICD-V12.50) 7)  Palpitations, Hx of  (ICD-V12.50) 8)  Coronary Artery Bypass Graft, Hx of  (ICD-V45.81) 9)  Dry Eye Syndrome  (ICD-375.15) 10)  COPD  (ICD-496) 11)  Aortic Valve Replacement, Hx of  (ICD-V43.3) 12)  Cad, Artery Bypass Graft  (ICD-414.04)  Current Medications (verified): 1)  Metoprolol Tartrate 50 Mg Tabs (Metoprolol Tartrate) .... 1/2 Tab By Mouth Once Daily 2)  Aspirin 81 Mg  Tabs (Aspirin) .Marland Kitchen.. 1 Tab By Mouth Once Daily 3)  Lumigan 0.03 % Soln (Bimatoprost) .Marland Kitchen.. 1 Drop Both Eyes Qhs 4)  Spiriva Handihaler 18 Mcg  Caps (Tiotropium Bromide Monohydrate) .... One Capsule Each Am Punch It Twice and Take Two Deep Drags 5)  Warfarin Sodium 5 Mg Tabs (Warfarin Sodium) .... Use As Directed By Anticoagulation Clinic 6)  Alphagan P 0.1 % Soln (Brimonidine Tartrate) .Marland Kitchen.. 1 Drop Both Eyes  Allergies (verified): 1)  ! Penicillin  Past History:  Past  Medical History: Last updated: 03/24/2009 ATHEROSCLEROSIS, HX OF (ICD-V12.50) PALPITATIONS, HX OF (ICD-V12.50) DRY EYE SYNDROME (ICD-375.15) COPD (ICD-496)    - PFT's 02/26/09  FEV1 1.36 (48%) ratio 29  7% improvment after B2, DLC0 56%    - CT 01/01/09 emphysema with mild pleural thickening. NO ILD CAD, ARTERY BYPASS GRAFT (ICD-414.04) AVR  Past Surgical History: Last updated: 10/09/2008 CABG/AVR:  08/03/2000    OPERATION PERFORMED:  Median sternotomy, extracorporeal circulation, coronary artery bypass graft surgery x 1 using a saphenous vein graft to the right coronary artery and aortic valve replacement using a 23 mm St. Jude mechanical valve. Back surgery 1965  Family History: Last updated: Feb 21, 2009 Father:Died at age 51 of heart attack. Mother: Died at age 87 of stroke  Family History of Hypertension:  Family History of Cancer:  Brother died at age 10 colon cancer. Emphysema- Brother  Social History: Last updated: Feb 21, 2009 Married with children Former smoker.  Quit in 2001.  Smoked "all my life" up to 2 ppd. Activity limited by dyspnea Non-drinker Retired  Review of Systems       Denies fever, malais, weight loss, blurry vision, decreased visual acuity, cough, sputum, , hemoptysis, pleuritic pain, palpitaitons, heartburn, abdominal pain, melena, lower extremity edema, claudication, or rash.   Vital Signs:  Patient profile:   77 year old male Height:      74 inches Weight:      170 pounds BMI:  21.91 Pulse rate:   60 / minute Resp:     12 per minute BP sitting:   130 / 80  (left arm)  Vitals Entered By: Kem Parkinson (January 14, 2010 10:24 AM)  Physical Exam  General:  Affect appropriate Healthy:  appears stated age HEENT: normal Neck supple with no adenopathy JVP normal no bruits no thyromegaly Lungs clear with no wheezing and good diaphragmatic motion Heart:  S1/S2click  systolic  murmur,rub, gallop or click PMI normal Abdomen: benighn, BS  positve, no tenderness, no AAA no bruit.  No HSM or HJR Distal pulses intact with no bruits No edema Neuro non-focal Skin warm and dry    Impression & Recommendations:  Problem # 1:  CORONARY ATHEROSCLEROSIS NATIVE CORONARY ARTERY (ICD-414.01) No angina continue ASA His updated medication list for this problem includes:    Metoprolol Tartrate 50 Mg Tabs (Metoprolol tartrate) .Marland Kitchen... 1/2 tab by mouth once daily    Aspirin 81 Mg Tabs (Aspirin) .Marland Kitchen... 1 tab by mouth once daily    Warfarin Sodium 5 Mg Tabs (Warfarin sodium) ..... Use as directed by anticoagulation clinic  Problem # 2:  COUMADIN THERAPY (ICD-V58.61) INR's Rx with no bleeding diathesis  Problem # 3:  AORTIC VALVE REPLACEMENT, HX OF (ICD-V43.3) 1991 but sounds normal and normal by echo.    Problem # 4:  COPD (ICD-496) Dyspnea at baseline with no active wheezing on exam His updated medication list for this problem includes:    Spiriva Handihaler 18 Mcg Caps (Tiotropium bromide monohydrate) ..... One capsule each am punch it twice and take two deep drags  Patient Instructions: 1)  Your physician recommends that you schedule a follow-up appointment in: 6 MONTHS

## 2010-11-10 NOTE — Medication Information (Signed)
Summary: rov/tm  Anticoagulant Therapy  Managed by: [name], PharmD Referring MD: Charlton Haws MD PCP: Dr. 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 no    Recent/future dental: no  Any missed doses?: no       Is patient compliant with meds? yes       Allergies: 1)  ! Penicillin  Anticoagulation Management History:      The patient is taking warfarin and comes in today for a routine follow up visit.  Positive risk factors for bleeding include an age of 75 years or older.  The bleeding index is 'intermediate risk'.  Positive CHADS2 values include Age > 75 years old.  The start date was 08/04/2000.  Anticoagulation responsible provider: Jens Som MD, Arlys John.  INR POC: 2.6.  Cuvette Lot#: [id number].  Exp: 03/2011.    Anticoagulation Management Assessment/Plan:      The patient's current anticoagulation dose is Warfarin sodium 5 mg tabs: Use as directed by Anticoagulation Clinic.  The target INR is 2.5 - 3.5.  The next INR is due 03/11/2010.  Anticoagulation instructions were given to patient.  Results were reviewed/authorized by [name], PharmD.  He was notified by [name] PharmD.         Prior Anticoagulation Instructions: INR 2.6 Continue 1 pill everyday except 1 1/2 pills on Mondays, Wednesdays and Fridays. Recheck in 4 weeks.   Current Anticoagulation Instructions: INR 2.6  Continue same dose of 1 tablet every day except 1 1/2 tablets on Monday, Wednesday and Friday

## 2010-11-11 ENCOUNTER — Ambulatory Visit: Admit: 2010-11-11 | Payer: Self-pay

## 2010-11-11 ENCOUNTER — Encounter: Payer: Self-pay | Admitting: Internal Medicine

## 2010-11-11 ENCOUNTER — Encounter (INDEPENDENT_AMBULATORY_CARE_PROVIDER_SITE_OTHER): Payer: Medicare Other

## 2010-11-11 DIAGNOSIS — I359 Nonrheumatic aortic valve disorder, unspecified: Secondary | ICD-10-CM

## 2010-11-11 DIAGNOSIS — Z7901 Long term (current) use of anticoagulants: Secondary | ICD-10-CM

## 2010-11-11 LAB — CONVERTED CEMR LAB: POC INR: 2.6

## 2010-11-12 NOTE — Medication Information (Signed)
Summary: rov/mw  Anticoagulant Therapy  Managed by: [name], PharmD Referring MD: Charlton Haws MD PCP: Dr. 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 no    Recent/future dental: no  Any missed doses?: no       Is patient compliant with meds? yes       Allergies: 1)  ! Penicillin  Anticoagulation Management History:      The patient is taking warfarin and comes in today for a routine follow up visit.  Positive risk factors for bleeding include an age of 19 years or older.  The bleeding index is 'intermediate risk'.  Positive CHADS2 values include Age > 25 years old.  The start date was 08/04/2000.  Anticoagulation responsible Tova Vater: Shirlee Latch MD, [place].  INR POC: 2.1.  Cuvette Lot#: [id number].  Exp: 11/2011.    Anticoagulation Management Assessment/Plan:      The patient's current anticoagulation dose is Warfarin sodium 5 mg tabs: Use as directed by Anticoagulation Clinic.  The target INR is 2.5 - 3.5.  The next INR is due 10/14/2010.  Anticoagulation instructions were given to patient.  Results were reviewed/authorized by [name], PharmD.  He was notified by [name] PharmD.         Prior Anticoagulation Instructions: INR 2.7 Continue taking 1 1/2 tablets on monday, wednesday, and friday. And 1 tablet all other days. Recheck in 4 weeks.   Current Anticoagulation Instructions: INR 2.1  Take 2 tablets today, 1 1/2 tablets tomorrow then resume same dose of 1 tablet every day except 1 1/2 tablets on Monday, Wednesday and Friday.  Recheck INR in 3 weeks.

## 2010-11-12 NOTE — Medication Information (Signed)
Summary: rov/sp  Anticoagulant Therapy  Managed by: [name], PharmD Referring MD: Charlton Haws MD PCP: Dr. 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 no    Recent/future dental: no  Any missed doses?: no       Is patient compliant with meds? yes       Allergies: 1)  ! Penicillin  Anticoagulation Management History:      The patient is taking warfarin and comes in today for a routine follow up visit.  Positive risk factors for bleeding include an age of 54 years or older.  The bleeding index is 'intermediate risk'.  Positive CHADS2 values include Age > 9 years old.  The start date was 08/04/2000.  Anticoagulation responsible provider: Antoine Poche MD, Fayrene Fearing.  INR POC: 2.7.  Cuvette Lot#: [id number].  Exp: 11/2011.    Anticoagulation Management Assessment/Plan:      The patient's current anticoagulation dose is Warfarin sodium 5 mg tabs: Use as directed by Anticoagulation Clinic.  The target INR is 2.5 - 3.5.  The next INR is due 11/11/2010.  Anticoagulation instructions were given to patient.  Results were reviewed/authorized by [name], PharmD.  He was notified by Stephannie Peters, PharmD Candidate .         Prior Anticoagulation Instructions: INR 2.1  Take 2 tablets today, 1 1/2 tablets tomorrow then resume same dose of 1 tablet every day except 1 1/2 tablets on Monday, Wednesday and Friday.  Recheck INR in 3 weeks.   Current Anticoagulation Instructions: INR 2.7 Continue 5 mg daily except 7.5 mg on Mondays, Wednesdays, and Fridays

## 2010-11-18 NOTE — Medication Information (Signed)
Summary: rov/kh  Anticoagulant Therapy  Managed by: Tammy Sours, PharmD Referring MD: Charlton Haws MD PCP: Dr. 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 no    Recent/future dental: no  Any missed doses?: no       Is patient compliant with meds? yes       Allergies: 1)  ! Penicillin  Anticoagulation Management History:      The patient is taking warfarin and comes in today for a routine follow up visit.  Positive risk factors for bleeding include an age of 64 years or older.  The bleeding index is 'intermediate risk'.  Positive CHADS2 values include Age > 53 years old.  The start date was 08/04/2000.  Anticoagulation responsible provider: Ladona Ridgel MD, Sharlot Gowda.  INR POC: 2.6.  Cuvette Lot#: [id number].  Exp: 11/2011.    Anticoagulation Management Assessment/Plan:      The patient's current anticoagulation dose is Warfarin sodium 5 mg tabs: Use as directed by Anticoagulation Clinic.  The target INR is 2.5 - 3.5.  The next INR is due 12/09/2010.  Anticoagulation instructions were given to patient.  Results were reviewed/authorized by Tammy Sours, PharmD.         Prior Anticoagulation Instructions: INR 2.7 Continue 5 mg daily except 7.5 mg on Mondays, Wednesdays, and Fridays  Current Anticoagulation Instructions: INR 2.6  Continue taking 1 tablet (5mg ) everday except take 1 and 1/2 tablets (7.5 mg) on Mondays, Wednesdays, and Fridays. Recheck INR in 4 weeks.

## 2010-11-19 ENCOUNTER — Telehealth: Payer: Self-pay | Admitting: Cardiovascular Disease

## 2010-11-26 NOTE — Progress Notes (Signed)
Summary: Question about medication  Phone Note Call from Patient Call back at Home Phone [phone]   Caller: Spouse/ Mrs.Bieler Summary of Call: Questions about medication  Methocarbam 500mg  Initial call taken by: Judie Grieve,  November 19, 2010 10:26 AM  Follow-up for Phone Call        spoke with pt wife, she is concerned about the interaction between robaxin and coumadin. dr nyland has prescribed robaxin. spoke with elizabeth pharm md in the coumadin clinic. there is no interaction with coumadin. pt wife aware Deliah Goody, RN  November 19, 2010 11:40 AM

## 2010-12-07 DIAGNOSIS — I359 Nonrheumatic aortic valve disorder, unspecified: Secondary | ICD-10-CM

## 2010-12-07 DIAGNOSIS — Z7901 Long term (current) use of anticoagulants: Secondary | ICD-10-CM | POA: Insufficient documentation

## 2010-12-07 DIAGNOSIS — Z952 Presence of prosthetic heart valve: Secondary | ICD-10-CM | POA: Insufficient documentation

## 2010-12-07 HISTORY — DX: Nonrheumatic aortic valve disorder, unspecified: I35.9

## 2010-12-09 ENCOUNTER — Encounter: Payer: Self-pay | Admitting: Cardiovascular Disease

## 2010-12-09 ENCOUNTER — Encounter (INDEPENDENT_AMBULATORY_CARE_PROVIDER_SITE_OTHER): Payer: Medicare Other

## 2010-12-09 DIAGNOSIS — I359 Nonrheumatic aortic valve disorder, unspecified: Secondary | ICD-10-CM

## 2010-12-09 DIAGNOSIS — Z7901 Long term (current) use of anticoagulants: Secondary | ICD-10-CM

## 2010-12-17 NOTE — Medication Information (Signed)
Summary: rov/tm  Anticoagulant Therapy  Managed by: [name], PharmD Referring MD: Charlton Haws MD PCP: Dr. 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 no    Recent/future dental: no  Any missed doses?: no       Is patient compliant with meds? yes       Allergies: 1)  ! Penicillin  Anticoagulation Management History:      The patient is taking warfarin and comes in today for a routine follow up visit.  Positive risk factors for bleeding include an age of 75 years or older.  The bleeding index is 'intermediate risk'.  Positive CHADS2 values include Age > 75 years old.  The start date was 08/04/2000.  Anticoagulation responsible provider: Excell Seltzer MD, Casimiro Needle.  INR POC: 2.1.  Cuvette Lot#: [id number].  Exp: 10/2011.    Anticoagulation Management Assessment/Plan:      The patient's current anticoagulation dose is Warfarin sodium 5 mg tabs: Use as directed by Anticoagulation Clinic.  The target INR is 2.5 - 3.5.  The next INR is due 12/30/2010.  Anticoagulation instructions were given to patient.  Results were reviewed/authorized by [name], PharmD.  He was notified by Margot Chimes PharmD Candidate.         Prior Anticoagulation Instructions: INR 2.6  Continue taking 1 tablet (5mg ) everday except take 1 and 1/2 tablets (7.5 mg) on Mondays, Wednesdays, and Fridays. Recheck INR in 4 weeks.   Current Anticoagulation Instructions: INR 2.1   Take 2.5 tablets today and then resume your normal dose of 1 tablet daily except on Mondays, Wednesdays, and Fridays when you take 1.5 tablets.  Recheck INR in 3 weeks.

## 2010-12-30 ENCOUNTER — Ambulatory Visit (INDEPENDENT_AMBULATORY_CARE_PROVIDER_SITE_OTHER): Payer: Medicare Other | Admitting: *Deleted

## 2010-12-30 DIAGNOSIS — Z952 Presence of prosthetic heart valve: Secondary | ICD-10-CM

## 2010-12-30 DIAGNOSIS — Z7901 Long term (current) use of anticoagulants: Secondary | ICD-10-CM

## 2010-12-30 DIAGNOSIS — I359 Nonrheumatic aortic valve disorder, unspecified: Secondary | ICD-10-CM

## 2010-12-30 LAB — POCT INR: INR: 2.2

## 2010-12-30 NOTE — Patient Instructions (Signed)
INR 2.2 Return to the clinic in 3 weeks Start taking 1.5 tabs daily except for 1 tab on Monday, Wednesday, and Friday

## 2011-01-04 ENCOUNTER — Other Ambulatory Visit: Payer: Self-pay | Admitting: *Deleted

## 2011-01-04 MED ORDER — WARFARIN SODIUM 5 MG PO TABS: 5.0000 mg | ORAL_TABLET | ORAL | Status: DC

## 2011-01-05 ENCOUNTER — Other Ambulatory Visit: Payer: Self-pay | Admitting: Pharmacist

## 2011-01-15 ENCOUNTER — Encounter: Payer: Self-pay | Admitting: Cardiovascular Disease

## 2011-01-20 ENCOUNTER — Encounter: Payer: Medicare Other | Admitting: *Deleted

## 2011-01-21 ENCOUNTER — Ambulatory Visit (INDEPENDENT_AMBULATORY_CARE_PROVIDER_SITE_OTHER): Payer: Medicare Other | Admitting: *Deleted

## 2011-01-21 ENCOUNTER — Encounter: Payer: Self-pay | Admitting: Cardiovascular Disease

## 2011-01-21 ENCOUNTER — Ambulatory Visit (INDEPENDENT_AMBULATORY_CARE_PROVIDER_SITE_OTHER): Payer: Medicare Other | Admitting: Cardiovascular Disease

## 2011-01-21 DIAGNOSIS — R0609 Other forms of dyspnea: Secondary | ICD-10-CM

## 2011-01-21 DIAGNOSIS — Z954 Presence of other heart-valve replacement: Secondary | ICD-10-CM

## 2011-01-21 DIAGNOSIS — Z7901 Long term (current) use of anticoagulants: Secondary | ICD-10-CM

## 2011-01-21 DIAGNOSIS — Z952 Presence of prosthetic heart valve: Secondary | ICD-10-CM

## 2011-01-21 DIAGNOSIS — I359 Nonrheumatic aortic valve disorder, unspecified: Secondary | ICD-10-CM

## 2011-01-21 DIAGNOSIS — I2510 Atherosclerotic heart disease of native coronary artery without angina pectoris: Secondary | ICD-10-CM

## 2011-01-21 NOTE — Assessment & Plan Note (Signed)
F/U Dr Sherene Sires  Pulmonary fibrosis no change on exam.

## 2011-01-21 NOTE — Progress Notes (Signed)
[name] is seen today in F/U for dyspnea, AVR, CAD and anticoagulaiton.  He had a St Jude AVR and SVG to RCA by Dr. Laneta Simmers in 2001.  He has done well.  He has had progressive dyspnea from previous smoking and interstitial lung disease.  he sees Dr. Sherene Sires and has had his inhalers changed.    He has not had any SSCP, palptitaitons, PND or orthopnea.  His dypnsea is significant but improved.  he is not coughing and has no hemoptysis.  [name]  follows his coumadin and he has been sub  Rx the last few visits but around 2.2-2.4  without bleeding diathesis.  HIs last echo on 01/2009 showed normal LV function and normal AVR with mean gradient and peak gradient of 22 mmHg.  He has pain in the thighs with ambulation which is not claudication as his pulses are excellent.  Should F/U with primary regarding hips.  SOB from interstitial fibrosis is stable  ROS: Denies fever, malais, weight loss, blurry vision, decreased visual acuity, cough, sputum, SOB, hemoptysis, pleuritic pain, palpitaitons, heartburn, abdominal pain, melena, lower extremity edema, claudication, or rash.   General: Affect appropriate Healthy:  appears stated age HEENT: normal Neck supple with no adenopathy JVP normal no bruits no thyromegaly Lungs clear with no wheezing and good diaphragmatic motion Heart:  S1/S2 click SEM  no ,rub, gallop or click PMI normal Abdomen: benighn, BS positve, no tenderness, no AAA no bruit.  No HSM or HJR Distal pulses intact with no bruits No edema Neuro non-focal Skin warm and dry No muscular weakness   Current Outpatient Prescriptions  Medication Sig Dispense Refill  . aspirin 81 MG tablet Take 81 mg by mouth daily.        . brinzolamide (AZOPT) 1 % ophthalmic suspension 1 drop 2 (two) times daily.        Marland Kitchen LUMIGAN 0.01 % SOLN Place 1 drop into both eyes At bedtime.      . metoprolol (LOPRESSOR) 50 MG tablet Take 25 mg by mouth daily.      Marland Kitchen tiotropium (SPIRIVA) 18 MCG inhalation  capsule Place 18 mcg into inhaler and inhale daily.        Marland Kitchen warfarin (COUMADIN) 5 MG tablet Take 1 tablet (5 mg total) by mouth as directed.  45 tablet  3  . DISCONTD: brimonidine (ALPHAGAN P) 0.1 % SOLN Place 1 drop into both eyes.          Allergies  Penicillins  Electrocardiogram:  Assessment and Plan

## 2011-01-21 NOTE — Assessment & Plan Note (Signed)
Single SVG RCA with AVR  No angina

## 2011-01-21 NOTE — Assessment & Plan Note (Signed)
Normal exam with no AR  Continue SBE prophylaxis

## 2011-01-21 NOTE — Patient Instructions (Signed)
Your physician recommends that you schedule a follow-up appointment in: 6 MONTHS 

## 2011-01-22 ENCOUNTER — Ambulatory Visit: Payer: Medicare Other | Admitting: Cardiovascular Disease

## 2011-01-27 ENCOUNTER — Ambulatory Visit: Payer: Medicare Other | Admitting: Cardiovascular Disease

## 2011-01-29 ENCOUNTER — Ambulatory Visit: Payer: Medicare Other | Admitting: Cardiovascular Disease

## 2011-02-08 ENCOUNTER — Encounter: Payer: Medicare Other | Admitting: *Deleted

## 2011-02-18 ENCOUNTER — Ambulatory Visit (INDEPENDENT_AMBULATORY_CARE_PROVIDER_SITE_OTHER): Payer: Medicare Other | Admitting: *Deleted

## 2011-02-18 DIAGNOSIS — Z7901 Long term (current) use of anticoagulants: Secondary | ICD-10-CM

## 2011-02-18 DIAGNOSIS — I359 Nonrheumatic aortic valve disorder, unspecified: Secondary | ICD-10-CM

## 2011-02-18 DIAGNOSIS — Z952 Presence of prosthetic heart valve: Secondary | ICD-10-CM

## 2011-02-23 NOTE — Assessment & Plan Note (Signed)
[facility]                            CARDIOLOGY OFFICE NOTE   [name], [name]                     MRN:          [id number]  DATE:11/21/2007                            DOB:          05/20/1930    [name] returns today for followup.  He has been doing well.  He has a  prosthetic aortic valve.  He did have  coronary artery bypass surgery at  that time of his valve replacement.  He had 40% right coronary artery  disease and 30% distal LAD disease.   He is a previous smoker with some COPD.  He has mild exertional dyspnea.  It is not changed. He has not had significant sputum production, fever  or coughing.  He has not had the flu this winter.   Dr. [name] is his medical doctor, but he has not had any blood work in  over a year. [name]'s wife was asking about this and I told her we  would be happy to check his routine lab work including a PSA and a TSH.   The patient's Coumadin level has been therapeutic.  Has not had any  bleeding diathesis.  It was 3.1 today.   He continues to be active at home.  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   I told him that given the fact that he is active and not having chest  pain, we would continue to postpone any followup stress testing.   CURRENT MEDICATIONS:  1. Coumadin as directed.  2. Baby aspirin a day.  3. Metoprolol 25 a day.  4. Eye drops.   PHYSICAL EXAMINATION:  His exam is remarkable for healthy-appearing,  thin, elderly white male in no distress.  Affect is appropriate.  Weight  is 176, blood pressure 140/70, pulse 56 and regular.  HEENT:  Unremarkable.  Carotids are normal without bruit, no  lymphadenopathy, thyromegaly, or JVP elevation.  LUNGS:  Clear. With good diaphragmatic motion.  No wheezing.  S1 with an S2 click, soft systolic murmur.  No aortic insufficiency.  PMI normal.  ABDOMEN:  Benign.  Bowel sounds positive.  No AAA.  No  hepatosplenomegaly. No hepatojugular reflux.  Distal pulses are intact, no edema.  NEURO:  nonfocal.  SKIN:  Warm and dry.   Baseline EKG is normal.   IMPRESSION:  1. Status post AVR, sounds normal.  No need for followup echo.      Continue SBE prophylaxis.  2. Bypass surgery with AVR, no chest pain.  Continue beta-blocker.      Consider followup stress test in a year.  3. Anticoagulation, no bleeding diathesis, levels fine.  Continue      follow-up in Coumadin Clinic.  4. General health maintenance. Followup lab work today to assess liver      function and kidney function and thyroid and prostate as well as      CBC and platelet count.   I will see [name] back in 88-month.     Noralyn Pick. Eden Emms, MD, [facility]  Electronically Signed  PCN/MedQ  DD: 11/21/2007  DT: 11/22/2007  Job #: [id number]

## 2011-02-23 NOTE — Assessment & Plan Note (Signed)
[name]                            CARDIOLOGY OFFICE NOTE   ALVESTER, EADS                     MRN:          [id number]  DATE:03/29/2007                            DOB:          11-08-1929    [name] returns today for followup.  He is status post single-vessel  CABG in 2001 with St. Jude aortic valve replacement.  He also has had a  history of palpitations but no documented PAF.  His last Myoview was in  December, 2006 and was nonischemic with a good EF.  In talking to the  patient, he has been doing fairly well.  His Coumadin has been a little  hard to control but has been better over the last 1-2 months.  His  palpitations are fairly quiescent.  He is not having any syncope, rapid  palpitations, PND, or orthopnea.   He has not had any bleeding diaphysis, and his stools have been negative  for blood and guaiacs x2.   His review of systems is otherwise negative.   He is on Coumadin as directed by our clinic and Toprol 25 mg a day and a  baby aspirin.   PHYSICAL EXAMINATION:  GENERAL:  Remarkable for a tall, thin elderly  white male in no distress.  He looks younger than his stated age.  VITAL SIGNS:  Weight is stable at 178.  Blood pressure is 140/80.  Pulse  60 and regular.  HEENT:  Normal.  NECK:  Carotids normal without bruit.  There is no thyromegaly or  lymphadenopathy.  No carotid bruits.  LUNGS:  Basilar crackles which clear with deep inspiration.  There is  normal diaphragmatic motion.  CARDIOVASCULAR:  There is an S1/S2 click with a soft systolic murmur.  There is no AI.  ABDOMEN:  Benign.  Bowel sounds are positive.  No AAA.  No  hepatosplenomegaly or hepatojugular reflux.  EXTREMITIES:  Distal pulses are intact.  No edema.  There are no bruits.  PT's are +2.  NEUROLOGIC:  Nonfocal.  There is no muscular weakness.   IMPRESSION:  1. Stable, status post aortic valve replacement with good      anticoagulation.  Follow  up back in a year.  2. Previous single vessel bypass. Continue aspirin therapy. Follow up      Myoview next year.  3. Benign palpitations:  Continue low-dose beta blocker.   Overall, I think [name] is doing well, and I will see him back in a  year.  His EKG today was entirely normal.    [name] C. Eden Emms, MD, [facility]  Electronically Signed   PCN/MedQ  DD: 03/29/2007  DT: 03/29/2007  Job #: [id number]

## 2011-02-23 NOTE — Assessment & Plan Note (Signed)
[facility]                            CARDIOLOGY OFFICE NOTE   [name], [name]                     MRN:          [id number]  DATE:06/10/2008                            DOB:          14-Apr-1930    [name] returns today for followup.  He is now 77 years old.  He has  significant COPD from previous smoking.  He quit when he had his CABG  and AVR in 2001.  He had a single vein graft to the RCA and a prosthetic  St. Jude aortic valve.  He has not had any problems with his Coumadin.  He has mild exertional dyspnea consistent with his COPD.  Unfortunately,  he does not see his primary care doctor, Dr. [name] on a regular basis.  We tend to do his routine lab work and x-rays.  He has had a previous  lung nodule and his chest x-ray needs a followup.   He has not had any cough, hemoptysis, or fever.   From a cardiac perspective, he is active.  He is not having significant  chest pain, PND, or orthopnea.   He has good LV function.  His INRs have been therapeutic.  He has not  had any bleeding diathesis.   His review of systems, otherwise negative.   His current medications include Coumadin as directed by our clinic,  metoprolol 25 mg a day, and aspirin a day, Lumigan eye drops and  dorzolamide and timolol eye drops.   PHYSICAL EXAMINATION:  GENERAL:  Remarkable for a tall thin male in no  distress.  VITAL SIGNS:  His weight is 171, blood pressure 150/80, pulse 54  regular, respiratory rate 14, and afebrile.  HEENT:  Unremarkable.  NECK:  Carotids are normal without bruit.  No lymphadenopathy,  thyromegaly, or JVP elevation.  LUNGS:  Remarkable for occasional rhonchi with COPD and decreased  diaphragmatic motion.  CARDIAC:  There is an S1 and S2 click with soft systolic murmur.  No  aortic insufficiency.  PMI normal.  Status post sternotomy. ABDOMEN:  Benign.  Bowel sounds positive.  No AAA.  No tenderness.  No bruit.  No  hepatosplenomegaly.   No hepatojugular reflux.  No tenderness.  EXTREMITIES:  Distal pulses are intact.  No edema.  NEURO:  Nonfocal.  SKIN:  Warm and dry.  No muscular weakness.   EKG is normal with sinus rhythm at a rate of 54.   IMPRESSION:  1. Coronary artery disease, previous coronary artery bypass graft.  A      single vein graft to the right coronary artery and no chest pain.      Continue aspirin and beta-blocker.  2. Aortic valve replacement functioning normally by exam.  Continue to      follow up in Coumadin Clinic.  Continue subacute bacterial      endocarditis prophylaxis.  3. Chronic obstructive pulmonary disease with previous smoking.  He      has a history of nodule at left lung base.  Followup chest x-ray.  4. Significant problems with dry eyes and astigmatism.  Continue  Lumigan and dorzolamide eye drops.  Follow up with optometrist.   I will see him back in 6 months.     Noralyn Pick. Eden Emms, MD, [facility]  Electronically Signed    PCN/MedQ  DD: 06/10/2008  DT: 06/11/2008  Job #: [id number]

## 2011-02-26 NOTE — Discharge Summary (Signed)
[place]. [facility]  Patient:    [name], [name]                       MRN: [id number] Adm. Date:  [id number] Disc. Date: [id number] Attending:  Cleatrice Burke Dictator:   Maxwell Marion, RNFA CC:         Delaney Meigs, M.D.  Noralyn Pick. Eden Emms, M.D. [facility]   Discharge Summary  DATE OF BIRTH:  05-29-2030  ADMISSION DIAGNOSIS:  Severe atherosclerosis and single-vessel coronary artery disease.  PAST MEDICAL HISTORY:  1. Hypertension.  2. Status post back surgery.  ALLERGIES:  The patient has an allergy to PENICILLIN.  DISCHARGE DIAGNOSIS:  Severe atherosclerosis/coronary artery disease, status post coronary artery bypass grafting and aortic valve replacement.  HOSPITAL COURSE AND PROCEDURES:  On August 03, 2000, Mr. Swaim was electively admitted to [facility] and Alleen Borne, M.D., for treatment of his aortic stenosis.  Prior to this admission, he has been in good health, except for a recent history of dyspnea on exertion and one episode of dizziness and nausea.  An echocardiogram on June 17, 2000, revealed a moderately dilated left ventricle, mild LVH, an ejection fraction of 45-55%, mild aortic regurgitation, and severe aortic stenosis with valve area estimated to be 0.57 sq cm with a peak gradient of 85 and a mean of 41. The cardiac catheterization on June 28, 2000, revealed noncritical coronary artery disease.  Carotid Dopplers revealed no carotid disease.  On August 03, 2000, Mr. Radloff underwent an uncomplicated coronary artery bypass grafting x 1 and aortic valve replacement by Alleen Borne, M.D.  Grafts placed at the time of the procedure were a saphenous vein graft to the distal right coronary artery.  A St. Jude mechanical valve, size 23 mm, was implanted.  At the conclusion of the procedure, he was transferred in stable condition to the SICU.  Mr. Bernhart had an uneventful postoperative course and made very  good progress.  It was anticipated that he could be discharged on August 08, 2000.  However, his INR was not therapeutic until August 09, 2000, so he was discharged on August 09, 2000.  MEDICATIONS ON DISCHARGE: 1. Enteric-coated aspirin 81 mg q.d. 2. Darvocet-N 100 one to two p.o. q.4-6h. p.r.n. 3. Lopressor 25 mg q.12h. 4. Coumadin.  He will be taking this as directed by his INR.  FOLLOW-UP:  His PT and INR will be followed two days after discharge at the [facility].  He will have an appointment to see Theron Arista C. Eden Emms, M.D., in about two weeks and have a chest x-ray taken at that time. He will also see Alleen Borne, M.D., in the CVTS office in three weeks. The office will call with a date and time for that appointment. DD:  08/31/00 TD:  09/02/00 Job: [id number]

## 2011-02-26 NOTE — Cardiovascular Report (Signed)
[place]. [facility]  Patient:    [name], [name]                       MRN: [id number] Adm. Date:  [id number] Attending:  Colon Branch CC:         Delaney Meigs, M.D.   Cardiac Catheterization  PROCEDURE:  Coronary arteriography.  INDICATIONS:  Severe aortic stenosis by echocardiography.  The patient is having symptoms of chest pain, dyspnea, and presyncope.  The study is done to assess coronary arteries prior to probable aortic valve replacement.  FINDINGS: 1. The left main coronary artery is normal. 2. The left anterior descending artery had 20% multiple discrete lesions in    the mid and distal portion.  The first diagonal branch had 30% multiple    discrete lesions. 3. The circumflex coronary artery was normal. 4. The right coronary artery had 40% multiple discrete lesions in the proximal    and mid portion.  Aortic root injection showed mild aortic root dilatation.  A JL5 catheter was used for injection of the left main.  There was angiographic grade 3/4 aortic insufficiency.  This was somewhat more extensive than appreciated on echocardiography.  Right heart catheterization:  Hemodynamic data: 1. Right heart catheterization showed a mean right atrial pressure of 13 mmHg. 2. PA pressure was 36/16. 3. RV pressure was 36/9. 4. Mean pulmonary capillary wedge pressure was 12 mmHg. 5. Cardiac output by Fick was 6.37 liters per minute with a cardiac index of    3.22 liters per minute per sq m.  Fluoroscopically, the aortic valve was heavily calcified.  Despite using multiple catheters and wires, we were unable to cross the valve.  This fits with the fact that the patient had severe aortic stenosis by echocardiography.  IMPRESSION:  Mr. Leichter will be discharged home today since he does not have critical coronary disease; however, he has symptomatic aortic stenosis with peak velocities greater than 4 meters per second on transthoracic  echo.  He will be referred for outpatient aortic valve replacement with saphenous vein graft to the right coronary artery.  He will need preoperative carotid duplex and PFTs since he is a heavy smoker. DD:  06/28/00 TD:  06/28/00 Job: [id number]

## 2011-02-26 NOTE — Assessment & Plan Note (Signed)
[facility]                            CARDIOLOGY OFFICE NOTE   [name], [name]                     MRN:          [id number]  DATE:09/29/2006                            DOB:          [birth year]    [name] seen today in followup.  He is status post St. Jude aortic  valve replacement and single vessel coronary artery bypass grafting, I  believe to the right coronary artery, by Dr. Laneta Simmers in October 2001.  He  has been doing fairly well.  His Coumadin was a little difficult to  control a few months ago, but now he is back on an every 6 week schedule  for checking his INRs.   He has had previous palpitations and has been maintained on low dose  metoprolol.  He is also on a baby aspirin a day given his coronary  disease.   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   REVIEW OF SYSTEMS:  Is remarkable for a cold about a month ago.  He did  get a flu shot after this.   He has not had any fevers, rigors, TIAs, CVAs.  He has had no chest  pain, PND or orthopnea.   MEDICATIONS:  1. Coumadin as directed by our clinic.  2. Lopressor 25 mg a day.  3. A baby aspirin a day.   EXAMINATION:  Blood pressure 140/80, pulse is 60 and regular.  HEENT:  Is normal.  Carotids are normal. There are no bruits.  LUNGS:  Are clear.  There is an S1, S2 click with a soft systolic murmur.  No aortic  insufficiency murmur.  ABDOMEN:  Benign.  LOWER EXTREMITIES:  Intact pulse, no edema.  Weight is stable at 178.  NEURO:  Nonfocal.  SKIN:  Is warm and dry.   IMPRESSION:  Stable.  Status post mechanical aortic valve replacement.  Good anticoagulation on Coumadin.  Continue baby aspirin a day since he  has a bypass graft in.   He had a stress Myoview in 2006 which was nonischemic.   He will continue to follow up in the Coumadin Clinic.  We will continue  his low dose metoprolol for  heart rate and blood pressure control.   I will see him back in 6 months.  Overall he is doing well.     Noralyn Pick. Eden Emms, MD, [facility]  Electronically Signed    PCN/MedQ  DD: 09/29/2006  DT: 09/29/2006  Job #: [id number]

## 2011-02-26 NOTE — Procedures (Signed)
. [facility]  Patient:    [name] [name]                       MRN: [id number] Proc. Date: 08/03/00 Adm. Date:  [id number] Attending:  Cleatrice Burke CC:         Burna Forts, M.D.   Procedure Report  PROCEDURE PERFORMED:  Transesophageal echocardiogram.  ANESTHESIOLOGIST:  Burna Forts, M.D.  INDICATIONS FOR PROCEDURE:  The patient is a 77 year old gentleman who presents today for aortic valve replacement and probable coronary artery bypass grafting by Alleen Borne, M.D.  Our intention is to place a TEE probe for evaluation of cardiac structures pre- and postvalvular replacement.  DESCRIPTION OF PROCEDURE:  After routine induction of general anesthesia, the patient was intubated.  TEE probe was passed oropharyngeally into the stomach and then withdrawn for imaging of the cardiac structures.  Precardiopulmonary bypass TEE:  Left ventricle:  This is a symmetrically thickened left ventricular chamber. Papillary muscles were also thickened, enlarged and well outlined.  There were no other masses noted within the left ventricular chamber in a short axis view.  Overall significantly hypertrophied left ventricular chamber and estimation of contractility is good.  Overall contractility appears satisfactory to good.  There is some slight suggestion of some decreasing contractility in the inferoposterolateral wall.  Otherwise there is good overall left ventricular function.  Mitral valve:  This was a thickened mitral valve apparatus significantly thickened in a portion of the anterior leaflet.  There also calcium in the posterior leaflet is noted.  However, overall functional aspect of the mitral valve is satisfactory in that there is only minimal mitral regurgitant flow. On closure of the valve, it does appear to coapt appropriately just below the level of the annulus.  Left atrium:  This is a small left atrial chamber noted.  The  appendage is able to be visualized.  There are no masses noted within the interatrial septum is intact.  This is a much heavily calcified aortic valve.  There is heavy calcium noted throughout.  It appears to be functionally bicuspid; however, with a large amount of calcium, complete visualization is difficult.  A short axis view as well as long axis views are obtained.  In the long axis view there is poststenotic dilatation on Doppler examination.  There is a small area of valvular opening and this limited valvular opening area, significant turbulence of flow is noted with the turbulent jet extended into the aortic root.  There is a small amount of eccentrically aortic insufficiency noted as well across this valve.  Right ventricle:  The right ventricle is a heavily trabeculated chamber.  Tricuspid valve:  Normal complaint, mobile functionally appropriate triscuspid valve.  Right atrium:  Normal right atrial chamber is noted.  The patient was placed on cardiopulmonary bypass and coronary artery bypass grafting was carried out.  The diseased aortic valve was excised and replaced with a St. Jude bivalvular apparatus.  Deairing maneuvers were carried out. The patient was  rewarmed and separated from cardiopulmonary bypass with the initial attempt.  Postcardiopulmonary bypass TEE:  (limited exam)  Left ventricle:  In the postbypass period, the left ventricular chamber appears functioning appropriately.  Volume status is such that there is small additional need for volume replacement in the early postbypass period.  There is some suggestion of overall cardiac depression in that the ventricle does not appear to be quite as contractile as it appeared in  the prebypass period. Nevertheless it appeared to be functioning in an appropriate manner with good overall contractility.  Again, noted the inferoposterolateral aspect of that wall appears somewhat hypokinetic.  Aortic valve:  In  place of the diseased aortic valve was now seen the periphery and bivalvular leaflets of the mechanical valve.  It appears seated well in the aorta in both short axis and long axis views.  The leaflets were somewhat difficult to see but able to be visualized.  Appeared to be opening and closing in appropriate manner.  On Doppler examination in the long axis view it did not offer any obstruction to flow and systolic ejection and only trivial jets of aortic insufficiency noted across the mechanical valve. Indeed it appeared to be functioning in an entirely satisfactory manner.  The rest of the cardiac examination was as previously described.  The patient returned to the intensive care unit in in stable condition. DD:  08/03/00 TD:  08/03/00 Job: [id number]

## 2011-02-26 NOTE — Op Note (Signed)
[place]  Patient:    [name], [name]                       MRN: [id number] Proc. Date: 08/03/00 Adm. Date:  [id number] Attending:  Cleatrice Burke CC:         Alleen Borne, M.D.  Noralyn Pick. Eden Emms, M.D. [facility]  Cath lab   Operative Report  PREOPERATIVE DIAGNOSIS:  Severe aortic stenosis and single vessel coronary artery disease.  POSTOPERATIVE DIAGNOSIS:  Severe aortic stenosis and single vessel coronary artery disease.  OPERATION PERFORMED:  Median sternotomy, extracorporeal circulation, coronary artery bypass graft surgery x 1 using a saphenous vein graft to the right coronary artery and aortic valve replacement using a 23 mm St. Jude mechanical valve.  SURGEON:  Alleen Borne, M.D.  ASSISTANT:  Loura Pardon, P.A.  ANESTHESIA:  General endotracheal.  INDICATIONS FOR PROCEDURE:  The patient is a 77 year old gentleman who was in previously good health but was recently diagnosed with severe aortic stenosis and moderate coronary disease. He has a several-year history of dyspnea on exertion which has worsened recently associated with episodic chest tightness. Echocardiogram on June 17, 2000 showed a moderately dilated left ventricle with mild left ventricular hypertrophy and an ejection fraction of 45 to 55% with inferior wall hypokinesis and septal hypokinesis.  There was mild aortic regurgitation and severe aortic stenosis with an aortic valve area of 0.57 cm squared and a peak gradient of 85 with a mean gradient of 41.  There was mild mitral annular calcification and mild mitral insufficiency as well as mild tricuspid regurgitation.  He subsequently underwent cardiac catheterization on June 28, 2000 which showed ____________ coronary disease with multiple 20% discrete lesions in the LAD proximally.  There was 40 to 50% multiple discrete lesions in the proximal to mid right coronary artery.  The left circumflex was normal.   There was 3+ aortic insufficiency.  It was not possible to cross the valve.  Right heart pressures were normal.  Pulmonary artery pressure was 36/16 with a wedge pressure of 12.  After review of the studies and examination of the patient it was felt that aortic valve replacement and coronary artery bypass to the right coronary artery was the best treatment.  I discussed the operative procedure with the patient and his family including alternatives to surgery, benefits, and risks including bleeding, possible blood transfusion, infection, stroke, myocardial infarction, and death.  We also discussed the different types of valves that could be used and decided that a mechanical St. Jude valve would be the best choice, given the patients age and no contraindication to anticoagulation.  DESCRIPTION OF PROCEDURE:  The patient was taken to the operating room and placed on the table in supine position.  After induction of general endotracheal anesthesia, a Foley catheter was placed in the bladder using sterile technique.  Then transesophageal echocardiogram was performed by anesthesiology.  This showed severe calcific aortic stenosis with moderate aortic insufficiency.  There was trivial mitral regurgitation.  The left ventricle was markedly hypertrophied with slight dilatation.  There was overall good left ventricular function.  Then the chest, abdomen and both lower extremities were prepped and draped in the usual sterile manner.  The chest was entered through a median sternotomy incision and the pericardium opened in the midline.  Examination of the heart showed good ventricular contractility.  The ascending aorta was relatively long with some post stenotic dilatation.  Then the patient  was heparinized and when an adequate activated clotting time was achieved, the distal ascending aorta was cannulated using a 20 French aortic cannula for arterial inflow.  Venous outflow was achieved using  a two-stage venous cannula through the right atrial appendage.  An antegrade cardioplegia and vent cannula was inserted into the aortic root.  A retrograde cardioplegia cannula was inserted through the right atrium into the coronary sinus.  A left ventricular vent was inserted through the right superior pulmnoary vein.  At the same time a segment of the greater saphenous vein was harvested from the right lower lobe and this vein was of medium size and good quality. Then the patient was placed on cardiopulmonary bypass and the distal coronary arteries were identified.  The LAD had mild proximal plaque.  The left circumflower extremities and its branches had no disease in them.  The right coronary artery was heavily diseased with plaque in its proximal and midportions but distally just before the posterior descending branch, there was no significant disease.  Then the aorta was cross-clamped and 500 cc of cold blood antegrade cardioplegia was administered in the aortic root with quick arrest of the heart.  Systemic hypothermia to 20 degrees centigrade and topical hypothermia with iced saline was used.  A temperature probe was placed in the septum and an insulating pad in the pericardium.  The first distal anastomosis was performed to the right coronary artery just before the take off of the posterior descending branch.  The internal diameter was greater than 3 mm.  The conduit used was a segment of greater saphenous vein.  Anastomosis was performed in an end-to-side manner using a continuous 7-0 Prolene suture.  The flow was measured through the graft and was excellent.  Then attention was turned to aortic valve replacement.  Additional doses of retrograde cardioplegia were given at approximately 20 to 30 minute intervals to maintain myocardial temperature around 10 degrees centigrade.  The aorta  was opened transversely approximately 1 cm above the take off of the right coronary  artery from the aorta.  Examination of the native valve showed that it was a three-leaflet valve with complete fusion of the commissure between the right and left coronary cusps.  The noncoronary cusp was completely calcified and nonmobile.  There was a small central opening to the valve. There was severe annular calcification particularly along the noncoronary leaflet.  The native valve was excised.  Then the annulus was decalcified with rongeurs taking care to remove all particulate debris.  The left ventricle was irrigated with iced saline solution.  The annulus was then incised and a 23 mm St. Jude mechanical valve was chosen.  A series of pledgeted 2-0 Ethibond horizontal mattress sutures were placed around the annulus with the pledgets in subannular position.  The sutures were placed through the sewing ring and the valve lowered into place.  The sutures were tied sequentially.  The valve seated nicely and the leaflets moved freely without obstruction.  The patient was then rewarmed to 37 degrees centigrade.  The aorta was closed in two layers using continuous 4-0 Prolene suture with Teflon felt strips to reinforce the closure since the aortic wall was quite thin.  Then with the crossclamp in place, the single proximal vein graft anastomosis was performed to the aortic root in an end-to-side manner using a continuous 6-0 Prolene suture.  Then the left side of the heart was deaired.  The patient was placed in the Trendelenburg position and the crossclamp was  removed with a time of 103 minutes.  There was spontaneous return of ventricular fibrillation and the patient was defibrillated into sinus rhythm.  The proximal and distal anastomoses appeared hemostatis and the line of the graft was satisfactory.  A graft marker was placed around the proximal anastomosis.  Two temporary right ventricular and right atrial pacing wires were placed and brought out through the skin.  When the  patient had rewarmed to 37 degrees centigrade, further deairing maneuvers were performed.  Then the left ventricular vent and retrograde cardioplegia cannulas were removed.  The patient was then weaned from cardiopulmonary bypass on no inotropic agents.  Total bypass time was 131 minutes.  Cardiac function appeared excellent with a cardiac output of 5L per minute.  Transesophageal echocardiogram was again performed by anesthesiology and showed a normal functioning St. Jude prosthesis with no evidence of perivalvular leak.  Left ventricular function was well preserved.  There was no change in the mitral regurgitation.  Protamine was then given and the venous and aortic cannulas were removed without difficulty.  The aortic root vent was removed without difficulty.  Two chest tubes were placed with a tube in the posterior pericardium and one in the anterior mediastinum.  The pericardium was reapproximated over the heart.  The sternum was closed with #6 stainless steel wires.  The fascia was closed with continuous #1 Vicryl suture.  The subcutaneous tissues were closed using continous 2-0 Vicryl and the skin with 3-0 Vicryl subcuticular closure.  The lower extremity vein harvest site was closed in layers in a similar manner.  The sponge, needle and instrument counts were correct according to the scrub nurse.  A dry sterile dressing was applied over the incisions and around the chest tubes which were hooked to Pleur-Evac suction.  The patient remained hemodynamically stable and was transported to the SICU in guarded but stable condition. DD:  08/03/00 TD:  08/03/00 Job: [id number]

## 2011-03-17 ENCOUNTER — Ambulatory Visit (INDEPENDENT_AMBULATORY_CARE_PROVIDER_SITE_OTHER): Payer: Medicare Other | Admitting: *Deleted

## 2011-03-17 DIAGNOSIS — I359 Nonrheumatic aortic valve disorder, unspecified: Secondary | ICD-10-CM

## 2011-03-17 DIAGNOSIS — Z7901 Long term (current) use of anticoagulants: Secondary | ICD-10-CM

## 2011-03-17 DIAGNOSIS — Z952 Presence of prosthetic heart valve: Secondary | ICD-10-CM

## 2011-04-07 ENCOUNTER — Ambulatory Visit (INDEPENDENT_AMBULATORY_CARE_PROVIDER_SITE_OTHER): Payer: Medicare Other | Admitting: *Deleted

## 2011-04-07 DIAGNOSIS — I359 Nonrheumatic aortic valve disorder, unspecified: Secondary | ICD-10-CM

## 2011-04-07 DIAGNOSIS — Z952 Presence of prosthetic heart valve: Secondary | ICD-10-CM

## 2011-04-07 DIAGNOSIS — Z7901 Long term (current) use of anticoagulants: Secondary | ICD-10-CM

## 2011-05-05 ENCOUNTER — Ambulatory Visit (INDEPENDENT_AMBULATORY_CARE_PROVIDER_SITE_OTHER): Payer: Medicare Other | Admitting: *Deleted

## 2011-05-05 DIAGNOSIS — Z952 Presence of prosthetic heart valve: Secondary | ICD-10-CM

## 2011-05-05 DIAGNOSIS — I359 Nonrheumatic aortic valve disorder, unspecified: Secondary | ICD-10-CM

## 2011-05-05 DIAGNOSIS — Z7901 Long term (current) use of anticoagulants: Secondary | ICD-10-CM

## 2011-05-05 LAB — POCT INR: INR: 3.6

## 2011-05-20 ENCOUNTER — Other Ambulatory Visit: Payer: Self-pay

## 2011-05-20 MED ORDER — WARFARIN SODIUM 5 MG PO TABS: ORAL_TABLET | ORAL | Status: DC

## 2011-06-02 ENCOUNTER — Ambulatory Visit (INDEPENDENT_AMBULATORY_CARE_PROVIDER_SITE_OTHER): Payer: Medicare Other | Admitting: *Deleted

## 2011-06-02 DIAGNOSIS — I359 Nonrheumatic aortic valve disorder, unspecified: Secondary | ICD-10-CM

## 2011-06-02 DIAGNOSIS — Z952 Presence of prosthetic heart valve: Secondary | ICD-10-CM

## 2011-06-02 DIAGNOSIS — Z7901 Long term (current) use of anticoagulants: Secondary | ICD-10-CM

## 2011-06-02 DIAGNOSIS — Z954 Presence of other heart-valve replacement: Secondary | ICD-10-CM

## 2011-06-30 ENCOUNTER — Ambulatory Visit (INDEPENDENT_AMBULATORY_CARE_PROVIDER_SITE_OTHER): Payer: Medicare Other | Admitting: *Deleted

## 2011-06-30 DIAGNOSIS — Z7901 Long term (current) use of anticoagulants: Secondary | ICD-10-CM

## 2011-06-30 DIAGNOSIS — I359 Nonrheumatic aortic valve disorder, unspecified: Secondary | ICD-10-CM

## 2011-06-30 DIAGNOSIS — Z952 Presence of prosthetic heart valve: Secondary | ICD-10-CM

## 2011-08-04 ENCOUNTER — Encounter: Payer: Self-pay | Admitting: Cardiovascular Disease

## 2011-08-04 ENCOUNTER — Ambulatory Visit (INDEPENDENT_AMBULATORY_CARE_PROVIDER_SITE_OTHER): Payer: Medicare Other | Admitting: Cardiovascular Disease

## 2011-08-04 ENCOUNTER — Ambulatory Visit (INDEPENDENT_AMBULATORY_CARE_PROVIDER_SITE_OTHER): Payer: Medicare Other | Admitting: *Deleted

## 2011-08-04 DIAGNOSIS — I2510 Atherosclerotic heart disease of native coronary artery without angina pectoris: Secondary | ICD-10-CM

## 2011-08-04 DIAGNOSIS — I359 Nonrheumatic aortic valve disorder, unspecified: Secondary | ICD-10-CM

## 2011-08-04 DIAGNOSIS — J449 Chronic obstructive pulmonary disease, unspecified: Secondary | ICD-10-CM

## 2011-08-04 DIAGNOSIS — Z954 Presence of other heart-valve replacement: Secondary | ICD-10-CM

## 2011-08-04 DIAGNOSIS — Z7901 Long term (current) use of anticoagulants: Secondary | ICD-10-CM

## 2011-08-04 DIAGNOSIS — Z952 Presence of prosthetic heart valve: Secondary | ICD-10-CM

## 2011-08-04 LAB — POCT INR: INR: 3.8

## 2011-08-04 NOTE — Assessment & Plan Note (Signed)
Stable with no angina and good activity level.  Continue medical Rx  

## 2011-08-04 NOTE — Assessment & Plan Note (Signed)
Stable no active wheezing.  F/U Dr Sherene Sires  Needs yearly CXR and flu shot

## 2011-08-04 NOTE — Progress Notes (Signed)
[name] is seen today in F/U for dyspnea, AVR, CAD and anticoagulaiton. He had a St Jude AVR and SVG to RCA by [name] in 2001. He has done well. He has had progressive dyspnea from previous smoking and interstitial lung disease. he sees [name] and has had his inhalers changed. He has not had any SSCP, palptitaitons, PND or orthopnea. His dypnsea is significant but improved. he is not coughing and has no hemoptysis. [name] follows his coumadin and he has been sub Rx the last few visits but around 2.2-2.4 without bleeding diathesis. HIs last echo on 01/2009 showed normal LV function and normal AVR with mean gradient and peak gradient of 22 mmHg.  He has pain in the thighs with ambulation which is not claudication as his pulses are excellent. Should F/U with primary regarding hips. SOB from interstitial fibrosis is stable  Needs flu shot and F/U with pulmonary for CXR.  Seeing primary Nyland in Novemeber   ROS: Denies fever, malais, weight loss, blurry vision, decreased visual acuity, cough, sputum, SOB, hemoptysis, pleuritic pain, palpitaitons, heartburn, abdominal pain, melena, lower extremity edema, claudication, or rash.  All other systems reviewed and negative  General: Affect appropriate Healthy:  appears stated age HEENT: normal Neck supple with no adenopathy JVP normal no bruits no thyromegaly Lungs clear with no wheezing and good diaphragmatic motion Heart:  S1/S2 click SEM   No ,rub, gallop or click PMI normal Abdomen: benighn, BS positve, no tenderness, no AAA no bruit.  No HSM or HJR Distal pulses intact with no bruits No edema Neuro non-focal Skin warm and dry No muscular weakness   Current Outpatient Prescriptions  Medication Sig Dispense Refill  . aspirin 81 MG tablet Take 81 mg by mouth daily.        . brinzolamide (AZOPT) 1 % ophthalmic suspension 1 drop 2 (two) times daily.        Marland Kitchen LUMIGAN 0.01 % SOLN Place 1 drop into both eyes At bedtime.      .  metoprolol (LOPRESSOR) 50 MG tablet Take 25 mg by mouth daily.      Marland Kitchen tiotropium (SPIRIVA) 18 MCG inhalation capsule Place 18 mcg into inhaler and inhale daily.        Marland Kitchen warfarin (COUMADIN) 5 MG tablet Take as directed by Anticoagulation clinic  45 tablet  3    Allergies  Penicillins  Electrocardiogram:  NSR rate 57 nonspecific flat ST's inferolateral leads   Assessment and Plan

## 2011-08-04 NOTE — Patient Instructions (Signed)
Your physician wants you to follow-up in: 6 MONTHS You will receive a reminder letter in the mail two months in advance. If you don't receive a letter, please call our office to schedule the follow-up appointment.   FOLLOW UP WITH PULMONARY FOR LUNG DISEASE

## 2011-08-04 NOTE — Assessment & Plan Note (Signed)
Normal exam.  Coumadin level today.  SBE

## 2011-08-10 ENCOUNTER — Encounter: Payer: Self-pay | Admitting: Internal Medicine

## 2011-08-10 ENCOUNTER — Encounter: Payer: Self-pay | Admitting: Cardiovascular Disease

## 2011-08-10 ENCOUNTER — Ambulatory Visit (INDEPENDENT_AMBULATORY_CARE_PROVIDER_SITE_OTHER)
Admission: RE | Admit: 2011-08-10 | Discharge: 2011-08-10 | Disposition: A | Discharge status: Home / Self Care | Payer: Medicare Other | Source: Ambulatory Visit | Attending: Internal Medicine | Admitting: Internal Medicine

## 2011-08-10 ENCOUNTER — Ambulatory Visit (INDEPENDENT_AMBULATORY_CARE_PROVIDER_SITE_OTHER): Payer: Medicare Other | Admitting: Internal Medicine

## 2011-08-10 VITALS — BP 140/80 | HR 65 | Temp 96.6°F | Ht 74.0 in | Wt 171.6 lb

## 2011-08-10 DIAGNOSIS — J4489 Other specified chronic obstructive pulmonary disease: Secondary | ICD-10-CM

## 2011-08-10 DIAGNOSIS — Z23 Encounter for immunization: Secondary | ICD-10-CM

## 2011-08-10 DIAGNOSIS — J449 Chronic obstructive pulmonary disease, unspecified: Secondary | ICD-10-CM

## 2011-08-10 NOTE — Patient Instructions (Addendum)
Work on smooth deep drag on the spiriva x 2 each am when you first get up  If you feel like you are loosing ground with activity tolerance return here  Please remember to go to the  x-ray department downstairs for your tests - we will call you with the results when then are available.    If you are satisfied with your treatment plan let your doctor know and he/she can either refill your medications or you can return here when your prescription runs out.     If in any way you are not 100% satisfied,  please tell us.  If 100% better, tell your friends!

## 2011-08-10 NOTE — Progress Notes (Signed)
Subjective:     Patient ID: [name], male   DOB: 1930/06/20, 77 y.o.   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  January 22, 2009 initial pulmonary ov reporting doe has leveled off, no better with with advair. No sign variability though does have some hoarseness but minimal cough without excess mucus. Rec trial of zegerid to see if hoarseness better and leave off dpi's on a trial basis.  02/26/2009 no improvement is sob, cough or hoarseness on or off advair, zegrid. rec d/c Timolol per opth,  Spiriva started.  Stop zegerid  March 24, 2009 ov much better on spiriva monotherpy, no longer limited. no cough. rec monotherapy with spiriva    08/10/2011 f/u ov/Zykia [place] cc doe persistent, non variable but no sing  Change activity tol  x > 1 year, able to do what he wants, no resting c/o's and No cough  Sleeping ok without nocturnal  or early am exacerbation  of respiratory  c/o's or need for noct saba. Also denies any obvious fluctuation of symptoms with weather or environmental changes or other aggravating or alleviating factors except as outlined above   Pt also denies any obvious fluctuation in symptoms with weather or environmental change or other alleviating or aggravating factors. Pt also denies any obvious fluctuation in symptoms with weather or environmental change or other alleviating or aggravating factors.    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 NO ILD  CAD, ARTERY BYPASS GRAFT (ICD-414.04)  AVR        Review of Systems     Objective:   Physical Exam    wt 170 January 22, 2009 > 170 02/26/09  > 172 March 24, 2009 > 171 08/10/2011  HEENT mild turbinate edema. Oropharynx all dentures no thrush or excess pnd or cobblestoning. No JVD or cervical adenopathy. Mild accessory muscle hypertrophy. Trachea midline, nl thryroid. Chest was hyperinflated by percussion with diminished breath sounds and marked increased exp time without wheeze. Hoover sign positive onset of inspiration. Regular rate and rhythm without murmur gallop or rub or increase P2.No edema. Decrease s1s2 Abd: no hsm, nl excursion. Ext warm without Cyanosis or clubbing  CXR  08/10/2011 :  1. Basilar scarring and reticulonodular interstitial accentuation likely a manifestation of fibrosis. 2. Emphysema.    Assessment:        Plan:

## 2011-08-10 NOTE — Assessment & Plan Note (Addendum)
PFT's 02/26/09 FEV1 1.36 (48%) ratio 29 7% improvment after B2, DLC0 56%   DPI 90% p coaching 08/10/2011  08/10/2011  Walked RA  2 laps @ 185 ft each stopped due to  Sob, no desat  GOLD III but no variability, can do pretty much anything he wants and this has leveled off over the years typical of mostly emphysematous copd for which spriva is the best choice.  The proper method of use, as well as anticipated side effects, of this dry powdered inhaler are discussed and demonstrated to the patient. imporved to 90%   Each maintenance medication was reviewed in detail including most importantly the difference between maintenance and as needed and under what circumstances the prns are to be used.  Please see instructions for details which were reviewed in writing and the patient given a copy.

## 2011-08-11 NOTE — Progress Notes (Signed)
Addended by: Christen Butter on: 08/11/2011 02:02 PM   Modules accepted: Orders

## 2011-08-25 ENCOUNTER — Ambulatory Visit (INDEPENDENT_AMBULATORY_CARE_PROVIDER_SITE_OTHER): Payer: Medicare Other | Admitting: *Deleted

## 2011-08-25 DIAGNOSIS — Z952 Presence of prosthetic heart valve: Secondary | ICD-10-CM

## 2011-08-25 DIAGNOSIS — Z7901 Long term (current) use of anticoagulants: Secondary | ICD-10-CM

## 2011-08-25 DIAGNOSIS — I359 Nonrheumatic aortic valve disorder, unspecified: Secondary | ICD-10-CM

## 2011-08-25 LAB — POCT INR: INR: 2.8

## 2011-09-22 ENCOUNTER — Ambulatory Visit (INDEPENDENT_AMBULATORY_CARE_PROVIDER_SITE_OTHER): Payer: Medicare Other | Admitting: *Deleted

## 2011-09-22 DIAGNOSIS — Z952 Presence of prosthetic heart valve: Secondary | ICD-10-CM

## 2011-09-22 DIAGNOSIS — Z7901 Long term (current) use of anticoagulants: Secondary | ICD-10-CM

## 2011-09-22 DIAGNOSIS — I359 Nonrheumatic aortic valve disorder, unspecified: Secondary | ICD-10-CM

## 2011-09-22 LAB — POCT INR: INR: 2.6

## 2011-10-11 ENCOUNTER — Other Ambulatory Visit: Payer: Self-pay | Admitting: Cardiovascular Disease

## 2011-10-11 MED ORDER — METOPROLOL TARTRATE 50 MG PO TABS: 25.0000 mg | ORAL_TABLET | Freq: Every day | ORAL | Status: DC

## 2011-10-20 ENCOUNTER — Ambulatory Visit (INDEPENDENT_AMBULATORY_CARE_PROVIDER_SITE_OTHER): Payer: Medicare Other | Admitting: *Deleted

## 2011-10-20 DIAGNOSIS — Z952 Presence of prosthetic heart valve: Secondary | ICD-10-CM

## 2011-10-20 DIAGNOSIS — Z7901 Long term (current) use of anticoagulants: Secondary | ICD-10-CM

## 2011-10-20 DIAGNOSIS — I359 Nonrheumatic aortic valve disorder, unspecified: Secondary | ICD-10-CM

## 2011-10-20 DIAGNOSIS — Z954 Presence of other heart-valve replacement: Secondary | ICD-10-CM

## 2011-10-20 LAB — POCT INR: INR: 3.4

## 2011-10-21 ENCOUNTER — Encounter: Payer: Medicare Other | Admitting: *Deleted

## 2011-10-25 ENCOUNTER — Other Ambulatory Visit: Payer: Self-pay | Admitting: Cardiovascular Disease

## 2011-10-25 MED ORDER — METOPROLOL TARTRATE 50 MG PO TABS: 25.0000 mg | ORAL_TABLET | Freq: Every day | ORAL | Status: DC

## 2011-10-27 ENCOUNTER — Encounter: Payer: Medicare Other | Admitting: *Deleted

## 2011-11-17 ENCOUNTER — Ambulatory Visit (INDEPENDENT_AMBULATORY_CARE_PROVIDER_SITE_OTHER): Payer: Medicare Other | Admitting: *Deleted

## 2011-11-17 DIAGNOSIS — Z952 Presence of prosthetic heart valve: Secondary | ICD-10-CM

## 2011-11-17 DIAGNOSIS — I359 Nonrheumatic aortic valve disorder, unspecified: Secondary | ICD-10-CM

## 2011-11-17 DIAGNOSIS — Z7901 Long term (current) use of anticoagulants: Secondary | ICD-10-CM

## 2011-11-17 DIAGNOSIS — Z954 Presence of other heart-valve replacement: Secondary | ICD-10-CM

## 2011-12-15 ENCOUNTER — Ambulatory Visit (INDEPENDENT_AMBULATORY_CARE_PROVIDER_SITE_OTHER): Payer: Medicare Other | Admitting: Pharmacist

## 2011-12-15 DIAGNOSIS — Z7901 Long term (current) use of anticoagulants: Secondary | ICD-10-CM

## 2011-12-15 DIAGNOSIS — I359 Nonrheumatic aortic valve disorder, unspecified: Secondary | ICD-10-CM

## 2011-12-15 DIAGNOSIS — Z954 Presence of other heart-valve replacement: Secondary | ICD-10-CM

## 2011-12-15 DIAGNOSIS — Z952 Presence of prosthetic heart valve: Secondary | ICD-10-CM

## 2012-01-19 ENCOUNTER — Ambulatory Visit (INDEPENDENT_AMBULATORY_CARE_PROVIDER_SITE_OTHER): Payer: Medicare Other | Admitting: *Deleted

## 2012-01-19 ENCOUNTER — Encounter: Payer: Self-pay | Admitting: Cardiovascular Disease

## 2012-01-19 ENCOUNTER — Ambulatory Visit (INDEPENDENT_AMBULATORY_CARE_PROVIDER_SITE_OTHER): Payer: Medicare Other | Admitting: Cardiovascular Disease

## 2012-01-19 VITALS — BP 128/72 | HR 56 | Wt 169.0 lb

## 2012-01-19 DIAGNOSIS — J449 Chronic obstructive pulmonary disease, unspecified: Secondary | ICD-10-CM

## 2012-01-19 DIAGNOSIS — Z954 Presence of other heart-valve replacement: Secondary | ICD-10-CM

## 2012-01-19 DIAGNOSIS — I359 Nonrheumatic aortic valve disorder, unspecified: Secondary | ICD-10-CM

## 2012-01-19 DIAGNOSIS — Z7901 Long term (current) use of anticoagulants: Secondary | ICD-10-CM

## 2012-01-19 DIAGNOSIS — I2510 Atherosclerotic heart disease of native coronary artery without angina pectoris: Secondary | ICD-10-CM

## 2012-01-19 DIAGNOSIS — Z952 Presence of prosthetic heart valve: Secondary | ICD-10-CM

## 2012-01-19 NOTE — Assessment & Plan Note (Signed)
F/U with Dr Sherene Sires.  Allergies make fibrosis worse.  Claritin suggested  Continue inhalers

## 2012-01-19 NOTE — Assessment & Plan Note (Signed)
Stable with no angina and good activity level.  Continue medical Rx  

## 2012-01-19 NOTE — Patient Instructions (Signed)
Your physician wants you to follow-up in:  6 MONTHS WITH DR NISHAN  You will receive a reminder letter in the mail two months in advance. If you don't receive a letter, please call our office to schedule the follow-up appointment. Your physician recommends that you continue on your current medications as directed. Please refer to the Current Medication list given to you today. 

## 2012-01-19 NOTE — Progress Notes (Signed)
Peirce is seen today in F/U for dyspnea, AVR, CAD and anticoagulaiton. He had a St Jude AVR and SVG to RCA by Dr. Laneta Simmers in 2001. He has done well. He has had progressive dyspnea from previous smoking and interstitial lung disease. he sees Dr. Sherene Sires and has had his inhalers changed. He has not had any SSCP, palptitaitons, PND or orthopnea. His dypnsea is significant but improved. he is not coughing and has no hemoptysis. [name] follows his coumadin and he has been sub Rx the last few visits but around 2.2-2.4 without bleeding diathesis. HIs last echo on 01/2009 showed normal LV function and normal AVR with mean gradient and peak gradient of 22 mmHg.  He has pain in the thighs with ambulation which is not claudication as his pulses are excellent. Should F/U with primary regarding hips. SOB from interstitial fibrosis is stable  Needs flu shot and F/U with pulmonary for CXR. Seeing primary Nyland in Novemeber   ROS: Denies fever, malais, weight loss, blurry vision, decreased visual acuity, cough, sputum, SOB, hemoptysis, pleuritic pain, palpitaitons, heartburn, abdominal pain, melena, lower extremity edema, claudication, or rash.  All other systems reviewed and negative  General: Affect appropriate Healthy:  appears stated age HEENT: normal Neck supple with no adenopathy JVP normal no bruits no thyromegaly Lungs basilar fibrotic crackles  no wheezing and good diaphragmatic motion Heart:  S1/S2click SEM , no rub, gallop or click PMI normal Abdomen: benighn, BS positve, no tenderness, no AAA no bruit.  No HSM or HJR Distal pulses intact with no bruits No edema Neuro non-focal Skin warm and dry No muscular weakness   Current Outpatient Prescriptions  Medication Sig Dispense Refill  . aspirin 81 MG tablet Take 81 mg by mouth daily.        Marland Kitchen atorvastatin (LIPITOR) 10 MG tablet Take 10 mg by mouth daily.        . brinzolamide (AZOPT) 1 % ophthalmic suspension 1 drop 2 (two) times  daily.        Marland Kitchen LUMIGAN 0.01 % SOLN Place 1 drop into both eyes At bedtime.      . metoprolol (LOPRESSOR) 50 MG tablet Take 0.5 tablets (25 mg total) by mouth daily.  60 tablet  11  . tiotropium (SPIRIVA) 18 MCG inhalation capsule Place 18 mcg into inhaler and inhale daily.        Marland Kitchen warfarin (COUMADIN) 5 MG tablet TAKE AS INSTRUCTED BY ANTICOAGULATION CLINIC  45 tablet  3    Allergies  Penicillins  Electrocardiogram:  Assessment and Plan

## 2012-01-19 NOTE — Assessment & Plan Note (Signed)
Normal exam  On coumadin  INR in clinic today

## 2012-01-24 ENCOUNTER — Other Ambulatory Visit: Payer: Self-pay | Admitting: Dermatology

## 2012-01-28 ENCOUNTER — Encounter (HOSPITAL_COMMUNITY): Payer: Self-pay | Admitting: *Deleted

## 2012-01-28 ENCOUNTER — Inpatient Hospital Stay (HOSPITAL_COMMUNITY)
Admission: EM | Admit: 2012-01-28 | Discharge: 2012-02-04 | DRG: 389 | Disposition: A | Discharge status: Home / Self Care | Payer: Medicare Other | Attending: Internal Medicine | Admitting: Internal Medicine

## 2012-01-28 ENCOUNTER — Emergency Department (HOSPITAL_COMMUNITY): Payer: Medicare Other

## 2012-01-28 DIAGNOSIS — R112 Nausea with vomiting, unspecified: Secondary | ICD-10-CM | POA: Diagnosis present

## 2012-01-28 DIAGNOSIS — Z954 Presence of other heart-valve replacement: Secondary | ICD-10-CM

## 2012-01-28 DIAGNOSIS — Z7901 Long term (current) use of anticoagulants: Secondary | ICD-10-CM | POA: Insufficient documentation

## 2012-01-28 DIAGNOSIS — N179 Acute kidney failure, unspecified: Secondary | ICD-10-CM | POA: Diagnosis present

## 2012-01-28 DIAGNOSIS — T45515A Adverse effect of anticoagulants, initial encounter: Secondary | ICD-10-CM | POA: Diagnosis present

## 2012-01-28 DIAGNOSIS — I2510 Atherosclerotic heart disease of native coronary artery without angina pectoris: Secondary | ICD-10-CM | POA: Diagnosis present

## 2012-01-28 DIAGNOSIS — E860 Dehydration: Secondary | ICD-10-CM | POA: Diagnosis present

## 2012-01-28 DIAGNOSIS — D6832 Hemorrhagic disorder due to extrinsic circulating anticoagulants: Secondary | ICD-10-CM | POA: Diagnosis present

## 2012-01-28 DIAGNOSIS — K5641 Fecal impaction: Secondary | ICD-10-CM | POA: Diagnosis present

## 2012-01-28 DIAGNOSIS — Z951 Presence of aortocoronary bypass graft: Secondary | ICD-10-CM

## 2012-01-28 DIAGNOSIS — R791 Abnormal coagulation profile: Secondary | ICD-10-CM | POA: Diagnosis present

## 2012-01-28 DIAGNOSIS — K56609 Unspecified intestinal obstruction, unspecified as to partial versus complete obstruction: Principal | ICD-10-CM | POA: Diagnosis present

## 2012-01-28 LAB — URINALYSIS, ROUTINE W REFLEX MICROSCOPIC
Hgb urine dipstick: NEGATIVE
Nitrite: NEGATIVE
Urobilinogen, UA: 1 mg/dL (ref 0.0–1.0)
pH: 6 (ref 5.0–8.0)

## 2012-01-28 LAB — DIFFERENTIAL
Basophils Absolute: 0 10*3/uL (ref 0.0–0.1)
Lymphocytes Relative: 8 % — ABNORMAL LOW (ref 12–46)
Lymphs Abs: 1.2 10*3/uL (ref 0.7–4.0)
Neutro Abs: 12.6 10*3/uL — ABNORMAL HIGH (ref 1.7–7.7)
Neutrophils Relative %: 82 % — ABNORMAL HIGH (ref 43–77)

## 2012-01-28 LAB — COMPREHENSIVE METABOLIC PANEL
ALT: 14 U/L (ref 0–53)
AST: 36 U/L (ref 0–37)
Alkaline Phosphatase: 130 U/L — ABNORMAL HIGH (ref 39–117)
CO2: 31 mEq/L (ref 19–32)
GFR calc Af Amer: 30 mL/min — ABNORMAL LOW (ref >90)
GFR calc non Af Amer: 26 mL/min — ABNORMAL LOW (ref >90)
Glucose, Bld: 162 mg/dL — ABNORMAL HIGH (ref 70–99)
Potassium: 4.4 mEq/L (ref 3.5–5.1)
Sodium: 138 mEq/L (ref 135–145)

## 2012-01-28 LAB — PROTIME-INR
INR: 4.07 — ABNORMAL HIGH (ref 0.00–1.49)
Prothrombin Time: 40.1 seconds — ABNORMAL HIGH (ref 11.6–15.2)

## 2012-01-28 LAB — CBC
Platelets: 234 10*3/uL (ref 150–400)
RBC: 4.95 MIL/uL (ref 4.22–5.81)
WBC: 15.4 10*3/uL — ABNORMAL HIGH (ref 4.0–10.5)

## 2012-01-28 LAB — URINE MICROSCOPIC-ADD ON

## 2012-01-28 MED ORDER — SODIUM CHLORIDE 0.9 % IV SOLN: 1000.0000 mL | INTRAVENOUS | Status: DC

## 2012-01-28 MED ORDER — PANTOPRAZOLE SODIUM 40 MG IV SOLR
40.0000 mg | INTRAVENOUS | Status: DC
  Administered 2012-01-28 – 2012-02-03 (×7): 40 mg via INTRAVENOUS
  Filled 2012-01-28 (×8): qty 40

## 2012-01-28 MED ORDER — SODIUM CHLORIDE 0.9 % IJ SOLN
3.0000 mL | Freq: Two times a day (BID) | INTRAMUSCULAR | Status: DC
  Administered 2012-01-28 – 2012-02-03 (×4): 3 mL via INTRAVENOUS

## 2012-01-28 MED ORDER — SODIUM CHLORIDE 0.9 % IV SOLN
1000.0000 mL | Freq: Once | INTRAVENOUS | Status: AC
  Administered 2012-01-28: 1000 mL via INTRAVENOUS

## 2012-01-28 MED ORDER — ONDANSETRON HCL 4 MG/2ML IJ SOLN
4.0000 mg | Freq: Once | INTRAMUSCULAR | Status: AC
  Administered 2012-01-28: 4 mg via INTRAVENOUS
  Filled 2012-01-28: qty 2

## 2012-01-28 MED ORDER — BIMATOPROST 0.01 % OP SOLN
1.0000 [drp] | Freq: Every day | OPHTHALMIC | Status: DC
  Administered 2012-01-28 – 2012-02-03 (×7): 1 [drp] via OPHTHALMIC
  Filled 2012-01-28: qty 2.5

## 2012-01-28 MED ORDER — ONDANSETRON HCL 4 MG/2ML IJ SOLN
4.0000 mg | Freq: Four times a day (QID) | INTRAMUSCULAR | Status: DC | PRN
Start: 2012-01-28 — End: 2012-02-04

## 2012-01-28 MED ORDER — TIOTROPIUM BROMIDE MONOHYDRATE 18 MCG IN CAPS
18.0000 ug | ORAL_CAPSULE | Freq: Every day | RESPIRATORY_TRACT | Status: DC
  Administered 2012-01-29 – 2012-02-04 (×6): 18 ug via RESPIRATORY_TRACT
  Filled 2012-01-28 (×2): qty 5

## 2012-01-28 MED ORDER — POTASSIUM CHLORIDE IN NACL 40-0.9 MEQ/L-% IV SOLN
INTRAVENOUS | Status: DC
  Administered 2012-01-28: 1000 mL/h via INTRAVENOUS
  Administered 2012-01-29 – 2012-01-30 (×3): via INTRAVENOUS
  Filled 2012-01-28 (×6): qty 1000

## 2012-01-28 MED ORDER — PHENOL 1.4 % MT LIQD
1.0000 | OROMUCOSAL | Status: DC | PRN
  Filled 2012-01-28: qty 177

## 2012-01-28 MED ORDER — METOPROLOL TARTRATE 1 MG/ML IV SOLN
5.0000 mg | Freq: Three times a day (TID) | INTRAVENOUS | Status: DC
  Administered 2012-01-28 – 2012-02-01 (×11): 5 mg via INTRAVENOUS
  Filled 2012-01-28 (×14): qty 5

## 2012-01-28 MED ORDER — BRINZOLAMIDE 1 % OP SUSP
1.0000 [drp] | Freq: Two times a day (BID) | OPHTHALMIC | Status: DC
  Administered 2012-01-28 – 2012-02-04 (×14): 1 [drp] via OPHTHALMIC
  Filled 2012-01-28: qty 10

## 2012-01-28 MED ORDER — FLEET ENEMA 7-19 GM/118ML RE ENEM
1.0000 | ENEMA | Freq: Once | RECTAL | Status: AC
  Administered 2012-01-28: 19:00:00 via RECTAL
  Filled 2012-01-28: qty 1

## 2012-01-28 MED ORDER — BRINZOLAMIDE 1 % OP SUSP
1.0000 [drp] | Freq: Two times a day (BID) | OPHTHALMIC | Status: DC
  Filled 2012-01-28: qty 10

## 2012-01-28 NOTE — ED Notes (Signed)
Pt states he is still not able to void. Pt states "I think I am dehydrated".

## 2012-01-28 NOTE — ED Notes (Signed)
Patient transported to X-ray 

## 2012-01-28 NOTE — H&P (Signed)
Hospital Admission Note Date: 01/28/2012  Patient name: [name] Medical record number: [id number] Date of birth: July 09, 1930 Age: 78 y.o. Gender: male PCP: [name], MD, MD  Attending physician: Altha Harm, MD  Chief Complaint: Nausea and vomiting x3 days  History of Present Illness: Patient is a lovely 78 year old gentleman who states that his last meal was 3 days ago which consisted of biscuits and gravy. The patient states that several hours after having eaten he became nauseous and started having emesis. He describes his emesis as nonbilious non-hematemesis. He has not been able to tolerate any oral intake since then. He denies any abdominal pain and states that he was passing gas up until today. He has not had any diarrhea. He has not had any chest pain, dizziness, shortness of breath, near-syncope, dysuria, frequency or urgency. Evaluation in the emergency room reveals mildly dehydrated gentleman who was found to have a small bowel obstruction on CT scan. Scheduled Meds:   . sodium chloride  1,000 mL Intravenous Once  . ondansetron  4 mg Intravenous Once  . ondansetron (ZOFRAN) IV  4 mg Intravenous Once   Continuous Infusions:   . sodium chloride 1,000 mL (01/28/12 1432)  . DISCONTD: sodium chloride     PRN Meds:. Allergies: Penicillins Past Medical History  Diagnosis Date  . CORONARY ATHEROSCLEROSIS NATIVE CORONARY ARTERY 04/23/2009  . DRY EYE SYNDROME 10/09/2008  . COPD 10/09/2008  . CAD, ARTERY BYPASS GRAFT 10/09/2008  . Aortic valve disorder 12/07/2010  . Palpitations   . Emphysema    Past Surgical History  Procedure Date  . Coronary artery bypass graft   . Aortic valve replacement    Family History  Problem Relation Age of Onset  . Heart attack Father   . Stroke Mother   . Hypertension    . Cancer    . Colon cancer Brother   . Emphysema Brother    History   Social History  . Marital Status: Married    Spouse Name: N/A   Number of Children: N/A  . Years of Education: N/A   Occupational History  . retired    Social History Main Topics  . Smoking status: Former Smoker -- 2.0 packs/day    Quit date: 10/12/1999  . Smokeless tobacco: Never Used  . Alcohol Use: No  . Drug Use: No  . Sexually Active: No   Other Topics Concern  . Not on file   Social History Narrative  . No narrative on file   Review of Systems: A comprehensive review of systems was negative. Physical Exam:  Intake/Output Summary (Last 24 hours) at 01/28/12 1648 Last data filed at 01/28/12 1420  Gross per 24 hour  Intake      0 ml  Output     30 ml  Net    -30 ml   General: Alert, awake, oriented x3, in no acute distress.  HEENT: [place]/AT PEERL, EOMI Neck: Trachea midline,  no masses, no thyromegal,y no JVD, no carotid bruit OROPHARYNX:  Moist, No exudate/ erythema/lesions.  Heart: Regular rate and rhythm, without murmurs, rubs, gallops, PMI non-displaced, no heaves or thrills on palpation.  Lungs: Clear to auscultation, no wheezing or rhonchi noted. No increased vocal fremitus resonant to percussion  Abdomen: Soft, nontender, mildly distended, diminished bowel sounds, no masses no hepatosplenomegaly noted..  Neuro: No focal neurological deficits noted cranial nerves II through XII grossly intact. DTRs 2+ bilaterally upper and lower extremities. Strength functional in bilateral upper and lower extremities.  Musculoskeletal: No warm swelling or erythema around joints, no spinal tenderness noted. Psychiatric: Patient alert and oriented x3, good insight and cognition, good recent to remote recall. Lymph node survey: No cervical axillary or inguinal lymphadenopathy noted.  Lab results:  [facility] 01/28/12 1205  NA 138  K 4.4  CL 94*  CO2 31  GLUCOSE 162*  BUN 38*  CREATININE 2.25*  CALCIUM 10.4  MG --  PHOS --    Basename 01/28/12 1205  AST 36  ALT 14  ALKPHOS 130*  BILITOT 0.6  PROT 8.0  ALBUMIN 3.7    Basename  01/28/12 1205  LIPASE 15  AMYLASE --    Basename 01/28/12 1205  WBC 15.4*  NEUTROABS 12.6*  HGB 14.7  HCT 44.2  MCV 89.3  PLT 234   No results found for this basename: CKTOTAL:3,CKMB:3,CKMBINDEX:3,TROPONINI:3 in the last 72 hours No components found with this basename: POCBNP:3 No results found for this basename: DDIMER:2 in the last 72 hours No results found for this basename: HGBA1C:2 in the last 72 hours No results found for this basename: CHOL:2,HDL:2,LDLCALC:2,TRIG:2,CHOLHDL:2,LDLDIRECT:2 in the last 72 hours No results found for this basename: TSH,T4TOTAL,FREET3,T3FREE,THYROIDAB in the last 72 hours No results found for this basename: VITAMINB12:2,FOLATE:2,FERRITIN:2,TIBC:2,IRON:2,RETICCTPCT:2 in the last 72 hours Imaging results:  Ct Abdomen Pelvis Wo Contrast  01/28/2012  *RADIOLOGY REPORT*  Clinical Data: Abdominal pain, nausea, constipation  CT ABDOMEN AND PELVIS WITHOUT CONTRAST  Technique:  Multidetector CT imaging of the abdomen and pelvis was performed following the standard protocol without intravenous contrast.  Comparison: CT scan of the chest 01/01/2009  Findings: Chronic mild interstitial prominence noted lung bases. Unenhanced liver, spleen and right adrenal gland are unremarkable. Hyperdense left adrenal nodule measures 2.7 by 2 cm stable in size and appearance from prior exam.  Unenhanced kidneys shows no nephrolithiasis.  There is a cyst in upper pole of the left kidney measures 4.6 cm.  Cyst in mid pole posterior aspect of the right kidney measures 4.4 cm.  A cyst in lower pole of the right kidney measures 2.5 cm.  Extensive atherosclerotic calcifications of the abdominal aorta, there is a and the iliac arteries are noted.  Oral contrast material was given to the patient.  Oral contrast material noted within stomach and proximal small bowel loops. There are distended small bowel loops in mid abdomen with multiple air-fluid levels.  Distal small bowel loops are collapsed  with small caliber.  Findings are highly suspicious for small bowel obstruction.  No any contrast material is noted within the distal small bowel or within colon.  Transition point in caliber is noted in the mid abdomen in axial image 49 and coronal image 21.  No pericecal inflammation is noted.  Significant stool noted in the distal sigmoid colon and rectum.  The rectum is distended with stool measures about 6.7 cm suspicious for mild fecal impaction. The left colon is empty collapsed.  No ascites or free air.  No adenopathy.  Prostate gland measures 5 x 3.7 cm.  The urinary bladder is unremarkable.  No destructive bony lesions are noted within pelvis. Sagittal images of the spine shows diffuse osteopenia. Degenerative changes of the lumbar spine are noted.  Significant disc space flattening noted at L4-L5 level.  Mild disc space flattening noted at L2-L3 level.  Normal appendix is partially visualized in axial image 55.  IMPRESSION:  1.  There are distended proximal small bowel loops with air-fluid levels.  The distal small bowel is smaller caliber.  Transition point  in caliber noted in the mid abdomen in axial image 48. Findings are highly suspicious for small bowel obstruction.  No any contrast material noted in distal small bowel. 2.  Stable hyperdense left adrenal nodule. 3.  Bilateral renal cysts are noted.  No hydronephrosis or hydroureter. 4.  Extensive atherosclerotic vascular calcifications. 5.  Normal appendix. 6.  Moderate stool noted in the rectosigmoid colon.  There is distention of the rectum with stools suspicious for distal fecal impaction.  Original Report Authenticated By: Natasha Mead, M.D.   Dg Abd Acute W/chest  01/28/2012  *RADIOLOGY REPORT*  Clinical Data: Vomiting, abdominal pain  ACUTE ABDOMEN SERIES (ABDOMEN 2 VIEW & CHEST 1 VIEW)  Comparison: None.  Findings: Cardiomediastinal silhouette is unremarkable.  The patient is status post median sternotomy and cardiac valve replacement.  Mild  hyperinflation.  Probable chronic mild interstitial prominence.  Probable fibrotic changes noted lung bases.  No acute infiltrate or pulmonary edema.  Distended small bowel loops are noted mid and left upper abdomen with air-fluid levels highly suspicious for small bowel obstruction.  No free abdominal air.  IMPRESSION: No acute disease within chest.  Mild hyperinflation.  Probable chronic mild interstitial prominence.  Distended small bowel loops with air-fluid levels are noted upper abdomen suspicious for small bowel obstruction.  No free abdominal air.  Original Report Authenticated By: Natasha Mead, M.D.   Other results: EKG: normal EKG, normal sinus rhythm, unchanged from previous tracings.   Patient Active Hospital Problem List: SBO (small bowel obstruction) (01/28/2012)   Assessment: The patient has evidence of small bowel obstruction on CT. He has a transition point in caliber noted in the mid abdomen highly suspicious for small bowel obstruction.    Plan: The plan at this time is for the patient bowel rest and place an NG tube to low intermittent wall suction. We will go ahead and normalize his electrolytes and observe him for the next 24 hours. Dependent upon the amount of gastric aspirate and the progression of his small bowel obstruction given the appearance and CT scan it may be necessary to involve general surgery in the management of this patient. I explained all this to his wife his son and his daughter who at the bedside.   Encounter for long-term (current) use of anticoagulants (12/07/2010)   Assessment: Patient has long-term use of anticoagulants for his mechanical valve replacement. Target INR is 2.5-3.5. Currently the patient is supratherapeutic.     Acute kidney injury (01/28/2012)   Assessment: In comparing the patient's baseline of creatinine and BUN has been in creatinine are indicative of an acute kidney injury likely secondary to prerenal state. The patient will receive IV  hydration and we will recheck his kidney function on tomorrow.     Dehydration (01/28/2012)   Assessment: Repleted with IV fluids.     Nausea & vomiting (01/28/2012)   Assessment: The patient will be given Zofran IV for nausea and vomiting.    Fecal impaction of colon (01/28/2012)   Assessment: CT scan shows fecal impaction and we'll go ahead and give the patient a fleets enema to relieve this.    Warfarin-induced coagulopathy (01/28/2012)   Assessment: The patient has a coagulopathy likely secondary to his decreased oral intake in the setting of chronic warfarin use. Will go ahead and allow the INR to drift down. When the INR gets below 2.5 the patient will be covered by IV heparin managed by pharmacy if he's not able to take Coumadin at that time.  GI prophylaxis: IV protonix  Gulianna Hornsby A. 01/28/2012, 4:48 PM

## 2012-01-28 NOTE — ED Notes (Signed)
Patient transported to CT 

## 2012-01-28 NOTE — Progress Notes (Signed)
ANTICOAGULATION CONSULT NOTE - Initial Consult  Pharmacy Consult for Heparin Indication: Hx of St. Jude AVR  Allergies  Allergen Reactions  . Penicillins Anaphylaxis    Patient Measurements:   Heparin Dosing Weight:   Vital Signs: Temp: 98.1 F (36.7 C) (04/19 1129) Temp src: Oral (04/19 1129) BP: 121/77 mmHg (04/19 1129) Pulse Rate: 96  (04/19 1129)  Labs:  Basename 01/28/12 1205  HGB 14.7  HCT 44.2  PLT 234  APTT --  LABPROT 40.1*  INR 4.07*  HEPARINUNFRC --  CREATININE 2.25*  CKTOTAL --  CKMB --  TROPONINI --   The CrCl is unknown because both a height and weight (above a minimum accepted value) are required for this calculation.  Medical History: Past Medical History  Diagnosis Date  . CORONARY ATHEROSCLEROSIS NATIVE CORONARY ARTERY 04/23/2009  . DRY EYE SYNDROME 10/09/2008  . COPD 10/09/2008  . CAD, ARTERY BYPASS GRAFT 10/09/2008  . Aortic valve disorder 12/07/2010  . Palpitations   . Emphysema     Medications:  Scheduled:    . sodium chloride  1,000 mL Intravenous Once  . ondansetron  4 mg Intravenous Once  . ondansetron (ZOFRAN) IV  4 mg Intravenous Once   Infusions:    . sodium chloride 1,000 mL (01/28/12 1432)  . DISCONTD: sodium chloride     PRN:   Assessment: 78 yo M on chronic warfarin for hx of St. Jude's AVR. Pt is now NPO and unable to take warfarin. Patient's current INR  is supratherapeutic;warfarin reportedly last taken 4/15 (per Med Rec). Will not yet start heparin due to this INR. Will reassess when to start heparin once INR approaches 2.5. Plan discussed with Dr. Lynelle Doctor in ER, who is in agreement.  Goal of Therapy:  INR 2.5-3.5 (St. Jude AVR)   Plan:  1) No heparin at this time 2) Daily INRs 3) Watch INR trend - plan to start heparin once INR approaches 2.5.  Darrol Angel, PharmD Pager: [phone] 01/28/2012,3:51 PM

## 2012-01-28 NOTE — ED Notes (Signed)
Per ems: pt has had n/v no diarrhea or fever. Pt has had abdominal pain x1 day. Pt c/o cramping. Pt is stable at this time.

## 2012-01-28 NOTE — ED Provider Notes (Signed)
History     CSN: [id number]  Arrival date & time 01/28/12  1115   First MD Initiated Contact with Patient 01/28/12 1120     HPI Patient presents to the emergency room with complaints of nausea and vomiting that started yesterday. Patient denies having any abdominal pain. He has had no diarrhea or fever. He states he did have some abdominal cramping-type symptoms associated with the nausea and vomiting. Patient states he vomited maybe 10 times over the last day. He has not noticed any blood but the last time he vomited he did start to look a little green. He was scheduled to see his doctor today but because of the persistent symptoms and decided to come to the emergency room instead. The patient denies recurrent history of abdominal problems. He is not been out of the country recently and has not been exposed to anyone who has had a similar illness. He states his wife was sick last week but it was a respiratory type infection. Has not been on any antibiotics recently. Patient states he's feeling somewhat better now than he did when he was at his worst. Past Medical History  Diagnosis Date  . CORONARY ATHEROSCLEROSIS NATIVE CORONARY ARTERY 04/23/2009  . DRY EYE SYNDROME 10/09/2008  . COPD 10/09/2008  . CAD, ARTERY BYPASS GRAFT 10/09/2008  . Aortic valve disorder 12/07/2010  . Palpitations   . Emphysema     Past Surgical History  Procedure Date  . Coronary artery bypass graft   . Aortic valve replacement     Family History  Problem Relation Age of Onset  . Heart attack Father   . Stroke Mother   . Hypertension    . Cancer    . Colon cancer Brother   . Emphysema Brother     History  Substance Use Topics  . Smoking status: Former Smoker -- 2.0 packs/day    Quit date: 10/12/1999  . Smokeless tobacco: Not on file  . Alcohol Use: No      Review of Systems  Constitutional: Negative for fever.  Respiratory: Negative for shortness of breath.   Cardiovascular: Negative for chest  pain and leg swelling.  Gastrointestinal: Positive for nausea and vomiting. Negative for diarrhea and blood in stool.  Genitourinary: Negative for dysuria and flank pain.  Neurological: Negative for headaches.  All other systems reviewed and are negative.    Allergies  Penicillins  Home Medications   Current Outpatient Rx  Name Route Sig Dispense Refill  . ASPIRIN 81 MG PO TABS Oral Take 81 mg by mouth daily.      . ATORVASTATIN CALCIUM 10 MG PO TABS Oral Take 10 mg by mouth daily.      Marland Kitchen BRINZOLAMIDE 1 % OP SUSP  1 drop 2 (two) times daily.      Marland Kitchen LUMIGAN 0.01 % OP SOLN Both Eyes Place 1 drop into both eyes At bedtime.    Marland Kitchen METOPROLOL TARTRATE 50 MG PO TABS Oral Take 0.5 tablets (25 mg total) by mouth daily. 60 tablet 11  . TIOTROPIUM BROMIDE MONOHYDRATE 18 MCG IN CAPS Inhalation Place 18 mcg into inhaler and inhale daily.      . WARFARIN SODIUM 5 MG PO TABS  TAKE AS INSTRUCTED BY ANTICOAGULATION CLINIC 45 tablet 3    BP 121/77  Pulse 96  Temp(Src) 98.1 F (36.7 C) (Oral)  Resp 18  SpO2 96%  Physical Exam  Nursing note and vitals reviewed. Constitutional: He appears well-developed and well-nourished. No distress.  HENT:  Head: Normocephalic and atraumatic.  Right Ear: External ear normal.  Left Ear: External ear normal.  Eyes: Conjunctivae are normal. Right eye exhibits no discharge. Left eye exhibits no discharge. No scleral icterus.  Neck: Neck supple. No tracheal deviation present.  Cardiovascular: Normal rate, regular rhythm and intact distal pulses.        Mechanical heart valve sound  Pulmonary/Chest: Effort normal and breath sounds normal. No stridor. No respiratory distress. He has no wheezes. He has no rales.  Abdominal: Soft. Bowel sounds are normal. He exhibits no distension. There is no tenderness. There is no rebound and no guarding.  Genitourinary: Rectum normal. Rectal exam shows no mass.       Stool in rectal vault but soft, does not appear impacted    Musculoskeletal: He exhibits no edema and no tenderness.  Neurological: He is alert. He has normal strength. No sensory deficit. Cranial nerve deficit:  no gross defecits noted. He exhibits normal muscle tone. He displays no seizure activity. Coordination normal.  Skin: Skin is warm and dry. No rash noted.  Psychiatric: He has a normal mood and affect.    ED Course  Procedures (including critical care time)  Rate: 94  Rhythm: normal sinus rhythm  QRS Axis: normal  Intervals: normal  ST/T Wave abnormalities: st depression  Conduction Disutrbances:none  Narrative Interpretation: LVH, st depression inferior  And lateral leads,   Old EKG Reviewed: unchanged  Medications  0.9 %  sodium chloride infusion (1000 mL Intravenous New Bag/Given 01/28/12 1200)    Followed by  0.9 %  sodium chloride infusion (1000 mL Intravenous Rate/Dose Change 01/28/12 1432)  aspirin EC 81 MG tablet (not administered)  cholecalciferol (VITAMIN D) 1000 UNITS tablet (not administered)  acetaminophen (TYLENOL) 500 MG tablet (not administered)  warfarin (COUMADIN) 5 MG tablet (not administered)  ondansetron (ZOFRAN) injection 4 mg (4 mg Intravenous Given 01/28/12 1156)  ondansetron (ZOFRAN) injection 4 mg (4 mg Intravenous Given 01/28/12 1427)    Labs Reviewed  CBC - Abnormal; Notable for the following:    WBC 15.4 (*)    All other components within normal limits  DIFFERENTIAL - Abnormal; Notable for the following:    Neutrophils Relative 82 (*)    Neutro Abs 12.6 (*)    Lymphocytes Relative 8 (*)    Monocytes Absolute 1.6 (*)    All other components within normal limits  COMPREHENSIVE METABOLIC PANEL - Abnormal; Notable for the following:    Chloride 94 (*)    Glucose, Bld 162 (*)    BUN 38 (*)    Creatinine, Ser 2.25 (*)    Alkaline Phosphatase 130 (*)    GFR calc non Af Amer 26 (*)    GFR calc Af Amer 30 (*)    All other components within normal limits  URINALYSIS, ROUTINE W REFLEX MICROSCOPIC -  Abnormal; Notable for the following:    Color, Urine ORANGE (*) BIOCHEMICALS MAY BE AFFECTED BY COLOR   APPearance CLOUDY (*)    Specific Gravity, Urine 1.034 (*)    Bilirubin Urine MODERATE (*)    Ketones, ur TRACE (*)    Protein, ur 30 (*)    All other components within normal limits  PROTIME-INR - Abnormal; Notable for the following:    Prothrombin Time 40.1 (*)    INR 4.07 (*)    All other components within normal limits  URINE MICROSCOPIC-ADD ON - Abnormal; Notable for the following:    Casts HYALINE CASTS (*)  All other components within normal limits  LIPASE, BLOOD   Ct Abdomen Pelvis Wo Contrast  01/28/2012  *RADIOLOGY REPORT*  Clinical Data: Abdominal pain, nausea, constipation  CT ABDOMEN AND PELVIS WITHOUT CONTRAST  Technique:  Multidetector CT imaging of the abdomen and pelvis was performed following the standard protocol without intravenous contrast.  Comparison: CT scan of the chest 01/01/2009  Findings: Chronic mild interstitial prominence noted lung bases. Unenhanced liver, spleen and right adrenal gland are unremarkable. Hyperdense left adrenal nodule measures 2.7 by 2 cm stable in size and appearance from prior exam.  Unenhanced kidneys shows no nephrolithiasis.  There is a cyst in upper pole of the left kidney measures 4.6 cm.  Cyst in mid pole posterior aspect of the right kidney measures 4.4 cm.  A cyst in lower pole of the right kidney measures 2.5 cm.  Extensive atherosclerotic calcifications of the abdominal aorta, there is a and the iliac arteries are noted.  Oral contrast material was given to the patient.  Oral contrast material noted within stomach and proximal small bowel loops. There are distended small bowel loops in mid abdomen with multiple air-fluid levels.  Distal small bowel loops are collapsed with small caliber.  Findings are highly suspicious for small bowel obstruction.  No any contrast material is noted within the distal small bowel or within colon.   Transition point in caliber is noted in the mid abdomen in axial image 49 and coronal image 21.  No pericecal inflammation is noted.  Significant stool noted in the distal sigmoid colon and rectum.  The rectum is distended with stool measures about 6.7 cm suspicious for mild fecal impaction. The left colon is empty collapsed.  No ascites or free air.  No adenopathy.  Prostate gland measures 5 x 3.7 cm.  The urinary bladder is unremarkable.  No destructive bony lesions are noted within pelvis. Sagittal images of the spine shows diffuse osteopenia. Degenerative changes of the lumbar spine are noted.  Significant disc space flattening noted at L4-L5 level.  Mild disc space flattening noted at L2-L3 level.  Normal appendix is partially visualized in axial image 55.  IMPRESSION:  1.  There are distended proximal small bowel loops with air-fluid levels.  The distal small bowel is smaller caliber.  Transition point in caliber noted in the mid abdomen in axial image 48. Findings are highly suspicious for small bowel obstruction.  No any contrast material noted in distal small bowel. 2.  Stable hyperdense left adrenal nodule. 3.  Bilateral renal cysts are noted.  No hydronephrosis or hydroureter. 4.  Extensive atherosclerotic vascular calcifications. 5.  Normal appendix. 6.  Moderate stool noted in the rectosigmoid colon.  There is distention of the rectum with stools suspicious for distal fecal impaction.  Original Report Authenticated By: Natasha Mead, M.D.   Dg Abd Acute W/chest  01/28/2012  *RADIOLOGY REPORT*  Clinical Data: Vomiting, abdominal pain  ACUTE ABDOMEN SERIES (ABDOMEN 2 VIEW & CHEST 1 VIEW)  Comparison: None.  Findings: Cardiomediastinal silhouette is unremarkable.  The patient is status post median sternotomy and cardiac valve replacement.  Mild hyperinflation.  Probable chronic mild interstitial prominence.  Probable fibrotic changes noted lung bases.  No acute infiltrate or pulmonary edema.  Distended  small bowel loops are noted mid and left upper abdomen with air-fluid levels highly suspicious for small bowel obstruction.  No free abdominal air.  IMPRESSION: No acute disease within chest.  Mild hyperinflation.  Probable chronic mild interstitial prominence.  Distended small bowel  loops with air-fluid levels are noted upper abdomen suspicious for small bowel obstruction.  No free abdominal air.  Original Report Authenticated By: Natasha Mead, M.D.     1. SBO (small bowel obstruction)       MDM  Pt appears to have an SBO.  The cause for this will need further investigation.  No mass noted on CT. Pt denies prior surgery.  NG tube has been placed.  Pt may require fecal disimpaction as this could be contributory.  NGT has been ordered. Pharmacy has been contacted regarding heparin and his valvular disease.  At this point he is supratherapeutic on his inr.  Pt will bed admitted to the medical service.        Celene Kras, MD 01/28/12 1556

## 2012-01-28 NOTE — ED Notes (Signed)
RUE:[id number]<JW> Expected date:<BR> Expected time:<BR> Means of arrival:<BR> Comments:<BR> Ems/ n/v

## 2012-01-29 ENCOUNTER — Inpatient Hospital Stay (HOSPITAL_COMMUNITY): Payer: Medicare Other

## 2012-01-29 LAB — CBC
HCT: 38.4 % — ABNORMAL LOW (ref 39.0–52.0)
MCHC: 33.6 g/dL (ref 30.0–36.0)
Platelets: 168 10*3/uL (ref 150–400)
RDW: 13.2 % (ref 11.5–15.5)
WBC: 6.6 10*3/uL (ref 4.0–10.5)

## 2012-01-29 LAB — BASIC METABOLIC PANEL
CO2: 35 mEq/L — ABNORMAL HIGH (ref 19–32)
Calcium: 9 mg/dL (ref 8.4–10.5)
Creatinine, Ser: 2.36 mg/dL — ABNORMAL HIGH (ref 0.50–1.35)
GFR calc Af Amer: 28 mL/min — ABNORMAL LOW (ref >90)
GFR calc non Af Amer: 25 mL/min — ABNORMAL LOW (ref >90)
Sodium: 141 mEq/L (ref 135–145)

## 2012-01-29 LAB — TSH: TSH: 2.144 u[IU]/mL (ref 0.350–4.500)

## 2012-01-29 LAB — PROTIME-INR
INR: 5.28 (ref 0.00–1.49)
Prothrombin Time: 49.2 seconds — ABNORMAL HIGH (ref 11.6–15.2)

## 2012-01-29 MED ORDER — MORPHINE SULFATE 2 MG/ML IJ SOLN
1.0000 mg | INTRAMUSCULAR | Status: DC | PRN
  Administered 2012-01-29: 1 mg via INTRAVENOUS
  Filled 2012-01-29: qty 1

## 2012-01-29 NOTE — Progress Notes (Deleted)
Subjective: Patient states that he feels better today he's had no further nausea or vomiting. The NG tube aspirate from this morning has been minimal. We'll continue to monitor output during the day.  Objective: Filed Vitals:   01/28/12 1817 01/28/12 2200 01/29/12 0600 01/29/12 1430  BP: 137/77 167/82 132/67 164/75  Pulse: 88 90 78 73  Temp: 99 F (37.2 C) 98.6 F (37 C) 98.4 F (36.9 C) 99.1 F (37.3 C)  TempSrc: Oral Oral Oral Oral  Resp: 16 18 18 18   SpO2: 91% 86% 97% 98%   Weight change:   Intake/Output Summary (Last 24 hours) at 01/29/12 1835 Last data filed at 01/29/12 1550  Gross per 24 hour  Intake 2749.09 ml  Output    500 ml  Net 2249.09 ml    General: Alert, awake, oriented x3, in no acute distress.  HEENT: [place]/AT PEERL, EOMI  Neck: Trachea midline, no masses, no thyromegal,y no JVD, no carotid bruit  OROPHARYNX: Moist, No exudate/ erythema/lesions.  Heart: Regular rate and rhythm, without murmurs, rubs, gallops, PMI non-displaced, no heaves or thrills on palpation.  Lungs: Clear to auscultation, no wheezing or rhonchi noted. No increased vocal fremitus resonant to percussion  Abdomen: Soft, nontender, mildly distended, diminished bowel sounds, no masses no hepatosplenomegaly noted..  Neuro: No focal neurological deficits noted cranial nerves II through XII grossly intact. DTRs 2+ bilaterally upper and lower extremities. Strength functional in bilateral upper and lower extremities.  Musculoskeletal: No warm swelling or erythema around joints, no spinal tenderness noted    Lab Results:  [facility] 01/29/12 0523 01/28/12 1205  NA 141 138  K 4.5 4.4  CL 99 94*  CO2 35* 31  GLUCOSE 123* 162*  BUN 47* 38*  CREATININE 2.36* 2.25*  CALCIUM 9.0 10.4  MG -- --  PHOS -- --    Basename 01/28/12 1205  AST 36  ALT 14  ALKPHOS 130*  BILITOT 0.6  PROT 8.0  ALBUMIN 3.7    Basename 01/28/12 1205  LIPASE 15  AMYLASE --    Basename 01/29/12 0523 01/28/12 1205    WBC 6.6 15.4*  NEUTROABS -- 12.6*  HGB 12.9* 14.7  HCT 38.4* 44.2  MCV 90.1 89.3  PLT 168 234   No results found for this basename: CKTOTAL:3,CKMB:3,CKMBINDEX:3,TROPONINI:3 in the last 72 hours No components found with this basename: POCBNP:3 No results found for this basename: DDIMER:2 in the last 72 hours No results found for this basename: HGBA1C:2 in the last 72 hours No results found for this basename: CHOL:2,HDL:2,LDLCALC:2,TRIG:2,CHOLHDL:2,LDLDIRECT:2 in the last 72 hours  Basename 01/29/12 0523  TSH 2.144  T4TOTAL --  T3FREE --  THYROIDAB --   No results found for this basename: VITAMINB12:2,FOLATE:2,FERRITIN:2,TIBC:2,IRON:2,RETICCTPCT:2 in the last 72 hours  Micro Results: No results found for this or any previous visit (from the past 240 hour(s)).  Studies/Results: Ct Abdomen Pelvis Wo Contrast  01/28/2012  *RADIOLOGY REPORT*  Clinical Data: Abdominal pain, nausea, constipation  CT ABDOMEN AND PELVIS WITHOUT CONTRAST  Technique:  Multidetector CT imaging of the abdomen and pelvis was performed following the standard protocol without intravenous contrast.  Comparison: CT scan of the chest 01/01/2009  Findings: Chronic mild interstitial prominence noted lung bases. Unenhanced liver, spleen and right adrenal gland are unremarkable. Hyperdense left adrenal nodule measures 2.7 by 2 cm stable in size and appearance from prior exam.  Unenhanced kidneys shows no nephrolithiasis.  There is a cyst in upper pole of the left kidney measures 4.6 cm.  Cyst in  mid pole posterior aspect of the right kidney measures 4.4 cm.  A cyst in lower pole of the right kidney measures 2.5 cm.  Extensive atherosclerotic calcifications of the abdominal aorta, there is a and the iliac arteries are noted.  Oral contrast material was given to the patient.  Oral contrast material noted within stomach and proximal small bowel loops. There are distended small bowel loops in mid abdomen with multiple air-fluid  levels.  Distal small bowel loops are collapsed with small caliber.  Findings are highly suspicious for small bowel obstruction.  No any contrast material is noted within the distal small bowel or within colon.  Transition point in caliber is noted in the mid abdomen in axial image 49 and coronal image 21.  No pericecal inflammation is noted.  Significant stool noted in the distal sigmoid colon and rectum.  The rectum is distended with stool measures about 6.7 cm suspicious for mild fecal impaction. The left colon is empty collapsed.  No ascites or free air.  No adenopathy.  Prostate gland measures 5 x 3.7 cm.  The urinary bladder is unremarkable.  No destructive bony lesions are noted within pelvis. Sagittal images of the spine shows diffuse osteopenia. Degenerative changes of the lumbar spine are noted.  Significant disc space flattening noted at L4-L5 level.  Mild disc space flattening noted at L2-L3 level.  Normal appendix is partially visualized in axial image 55.  IMPRESSION:  1.  There are distended proximal small bowel loops with air-fluid levels.  The distal small bowel is smaller caliber.  Transition point in caliber noted in the mid abdomen in axial image 48. Findings are highly suspicious for small bowel obstruction.  No any contrast material noted in distal small bowel. 2.  Stable hyperdense left adrenal nodule. 3.  Bilateral renal cysts are noted.  No hydronephrosis or hydroureter. 4.  Extensive atherosclerotic vascular calcifications. 5.  Normal appendix. 6.  Moderate stool noted in the rectosigmoid colon.  There is distention of the rectum with stools suspicious for distal fecal impaction.  Original Report Authenticated By: Natasha Mead, M.D.   Dg Abd Acute W/chest  01/28/2012  *RADIOLOGY REPORT*  Clinical Data: Vomiting, abdominal pain  ACUTE ABDOMEN SERIES (ABDOMEN 2 VIEW & CHEST 1 VIEW)  Comparison: None.  Findings: Cardiomediastinal silhouette is unremarkable.  The patient is status post median  sternotomy and cardiac valve replacement.  Mild hyperinflation.  Probable chronic mild interstitial prominence.  Probable fibrotic changes noted lung bases.  No acute infiltrate or pulmonary edema.  Distended small bowel loops are noted mid and left upper abdomen with air-fluid levels highly suspicious for small bowel obstruction.  No free abdominal air.  IMPRESSION: No acute disease within chest.  Mild hyperinflation.  Probable chronic mild interstitial prominence.  Distended small bowel loops with air-fluid levels are noted upper abdomen suspicious for small bowel obstruction.  No free abdominal air.  Original Report Authenticated By: Natasha Mead, M.D.    Medications: I have reviewed the patient's current medications. Scheduled Meds:   . bimatoprost  1 drop Both Eyes QHS  . brinzolamide  1 drop Both Eyes BID  . metoprolol  5 mg Intravenous Q8H  . pantoprazole (PROTONIX) IV  40 mg Intravenous Q24H  . sodium chloride  3 mL Intravenous Q12H  . sodium phosphate  1 enema Rectal Once  . tiotropium  18 mcg Inhalation Daily  . DISCONTD: brinzolamide  1 drop Both Eyes BID   Continuous Infusions:   . 0.9 % NaCl  with KCl 40 mEq / L 100 mL/hr at 01/29/12 1824   PRN Meds:.morphine injection, ondansetron, phenol Assessment/Plan: Patient Active Hospital Problem List: SBO (small bowel obstruction) (01/28/2012)   Assessment: Clinically improved. We'll obtain repeat abdominal x-ray and evaluate the NG tube aspirate through the day. If miniscule consider clamping the NG tube tomorrow and given a trial of clear liquids.    Encounter for long-term (current) use of anticoagulants (12/07/2010)   Assessment: INR even higher today. If it continues to increase may need to consider giving FFP. Presently no evidence of bleeding.    Acute kidney injury (01/28/2012)   Assessment: Creatinine mildly increased. We'll continue to monitor     Dehydration (01/28/2012)   Assessment: Continue IV fluids while n.p.o.       Nausea & vomiting (01/28/2012)   Assessment: Resolved with placement of NG tube    Fecal impaction of colon (01/28/2012)   Assessment: We'll order soapsuds enema to be administered today    Warfarin-induced coagulopathy (01/28/2012)   Assessment: See above      LOS: 1 day  CRITICAL VALUE ALERT  Critical value received:  INR 5.76  Date of notification:  [id number]   Time of notification:  0629  Critical value read back: yes  Nurse who received alert:  Orlean Bradford  MD notified (1st page):  Claiborne Billings  Time of first page:  0629  MD notified (2nd page):  Time of second page:  Responding MD:    Time MD responded:

## 2012-01-29 NOTE — Progress Notes (Signed)
ANTICOAGULATION CONSULT NOTE - Initial Consult  Pharmacy Consult for Heparin Indication: Hx of St. Jude AVR  Allergies  Allergen Reactions  . Penicillins Anaphylaxis    Patient Measurements:   Vital Signs: Temp: 99.1 F (37.3 C) (04/20 1430) Temp src: Oral (04/20 1430) BP: 164/75 mmHg (04/20 1430) Pulse Rate: 73  (04/20 1430)  Labs:  Basename 01/29/12 0523 01/28/12 1205  HGB 12.9* 14.7  HCT 38.4* 44.2  PLT 168 234  APTT -- --  LABPROT 49.2* 40.1*  INR 5.28* 4.07*  HEPARINUNFRC -- --  CREATININE 2.36* 2.25*  CKTOTAL -- --  CKMB -- --  TROPONINI -- --   The CrCl is unknown because both a height and weight (above a minimum accepted value) are required for this calculation.  Medical History: Past Medical History  Diagnosis Date  . CORONARY ATHEROSCLEROSIS NATIVE CORONARY ARTERY 04/23/2009  . DRY EYE SYNDROME 10/09/2008  . COPD 10/09/2008  . CAD, ARTERY BYPASS GRAFT 10/09/2008  . Aortic valve disorder 12/07/2010  . Palpitations   . Emphysema     Medications:  Scheduled:     . bimatoprost  1 drop Both Eyes QHS  . brinzolamide  1 drop Both Eyes BID  . metoprolol  5 mg Intravenous Q8H  . pantoprazole (PROTONIX) IV  40 mg Intravenous Q24H  . sodium chloride  3 mL Intravenous Q12H  . sodium phosphate  1 enema Rectal Once  . tiotropium  18 mcg Inhalation Daily  . DISCONTD: brinzolamide  1 drop Both Eyes BID   Infusions:     . 0.9 % NaCl with KCl 40 mEq / L 100 mL/hr at 01/29/12 0808  . DISCONTD: sodium chloride 1,000 mL (01/28/12 1432)   Assessment:  78 yo M on chronic warfarin for hx of St. Jude's AVR.   Pt is now NPO and unable to take warfarin,current INR  remains supratherapeutic; 5.28 (from 4.07 on admit)  Home dose Warfarin; 5mg  MWF, 7.5mg  other days. Last dose 4/15  Goal of Therapy:  INR 2.5-3.5 (St. Jude AVR)   Plan:  1) No heparin necessary. 2) Daily INRs 3) Watch INR trend - plan to start heparin once INR approaches 2.5.  Darrol Angel, PharmD Pager: [phone] 01/29/2012,5:30 PM

## 2012-01-29 NOTE — Progress Notes (Signed)
Page Lenny Pastel NP about the pt's critical lab this am INR is 5.28 no active bleeding no orders given at this time will monitor.

## 2012-01-30 LAB — PROTIME-INR
INR: 5.76 (ref 0.00–1.49)
Prothrombin Time: 52.6 seconds — ABNORMAL HIGH (ref 11.6–15.2)

## 2012-01-30 LAB — BASIC METABOLIC PANEL
CO2: 30 mEq/L (ref 19–32)
Calcium: 8.4 mg/dL (ref 8.4–10.5)
Chloride: 109 mEq/L (ref 96–112)
Creatinine, Ser: 1.9 mg/dL — ABNORMAL HIGH (ref 0.50–1.35)
GFR calc Af Amer: 37 mL/min — ABNORMAL LOW (ref >90)
Sodium: 143 mEq/L (ref 135–145)

## 2012-01-30 LAB — DIFFERENTIAL
Basophils Absolute: 0 10*3/uL (ref 0.0–0.1)
Basophils Relative: 0 % (ref 0–1)
Eosinophils Relative: 2 % (ref 0–5)
Lymphocytes Relative: 13 % (ref 12–46)
Monocytes Absolute: 1 10*3/uL (ref 0.1–1.0)
Neutro Abs: 3.4 10*3/uL (ref 1.7–7.7)

## 2012-01-30 LAB — CBC
HCT: 37.7 % — ABNORMAL LOW (ref 39.0–52.0)
MCHC: 31 g/dL (ref 30.0–36.0)
Platelets: 120 10*3/uL — ABNORMAL LOW (ref 150–400)
RDW: 12.9 % (ref 11.5–15.5)
WBC: 5.2 10*3/uL (ref 4.0–10.5)

## 2012-01-30 LAB — ABO/RH: ABO/RH(D): A POS

## 2012-01-30 MED ORDER — SODIUM CHLORIDE 0.9 % IV SOLN
INTRAVENOUS | Status: DC
Start: 2012-01-30 — End: 2012-02-04
  Administered 2012-01-30: 1000 mL via INTRAVENOUS
  Administered 2012-02-01 – 2012-02-02 (×3): via INTRAVENOUS
  Administered 2012-02-03: 100 mL/h via INTRAVENOUS
  Administered 2012-02-04: via INTRAVENOUS

## 2012-01-30 NOTE — Progress Notes (Signed)
INR Critical Value 5.76. Notified attending. Confirmed no active bleeding, stated he would advise hospitalist

## 2012-01-30 NOTE — Progress Notes (Signed)
Subjective: Patient states he feels much better today and is requesting fluids if possible. Patient's daughter and son-in-law are in the room and have been updated on patient's condition.  Interval history: The patient has had minimal output from the NG tube. Over the last 24 hours he's had only 200 ml's of NGT aspirate. Objective: Filed Vitals:   01/29/12 1430 01/29/12 2154 01/30/12 0554 01/30/12 0816  BP: 164/75 167/83 163/79   Pulse: 73 75 72   Temp: 99.1 F (37.3 C) 98.4 F (36.9 C) 98.3 F (36.8 C)   TempSrc: Oral Oral Oral   Resp: 18 18 16    SpO2: 98% 99% 96% 95%   Weight change:   Intake/Output Summary (Last 24 hours) at 01/30/12 1103 Last data filed at 01/30/12 8119  Gross per 24 hour  Intake 1149.09 ml  Output    550 ml  Net 599.09 ml    General: Alert, awake, oriented x3, in no acute distress.  HEENT: [place]/AT PEERL, EOMI Neck: Trachea midline,  no masses, no thyromegal,y no JVD, no carotid bruit OROPHARYNX:  Moist, No exudate/ erythema/lesions.  Heart: Regular rate and rhythm, without murmurs, rubs, gallops, PMI non-displaced, no heaves or thrills on palpation.  Lungs: Clear to auscultation, no wheezing or rhonchi noted. No increased vocal fremitus resonant to percussion  Abdomen: Soft, nontender, nondistended, diminished but positive bowel sounds, no masses no hepatosplenomegaly noted..    Lab Results:  Basename 01/30/12 0530 01/29/12 0523  NA 143 141  K 5.3* 4.5  CL 109 99  CO2 30 35*  GLUCOSE 115* 123*  BUN 46* 47*  CREATININE 1.90* 2.36*  CALCIUM 8.4 9.0  MG -- --  PHOS -- --    Basename 01/28/12 1205  AST 36  ALT 14  ALKPHOS 130*  BILITOT 0.6  PROT 8.0  ALBUMIN 3.7    Basename 01/28/12 1205  LIPASE 15  AMYLASE --    Basename 01/30/12 0530 01/29/12 0523 01/28/12 1205  WBC 5.2 6.6 --  NEUTROABS 3.4 -- 12.6*  HGB 11.7* 12.9* --  HCT 37.7* 38.4* --  MCV 94.3 90.1 --  PLT 120* 168 --   No results found for this basename:  CKTOTAL:3,CKMB:3,CKMBINDEX:3,TROPONINI:3 in the last 72 hours No components found with this basename: POCBNP:3 No results found for this basename: DDIMER:2 in the last 72 hours No results found for this basename: HGBA1C:2 in the last 72 hours No results found for this basename: CHOL:2,HDL:2,LDLCALC:2,TRIG:2,CHOLHDL:2,LDLDIRECT:2 in the last 72 hours  Basename 01/29/12 0523  TSH 2.144  T4TOTAL --  T3FREE --  THYROIDAB --   No results found for this basename: VITAMINB12:2,FOLATE:2,FERRITIN:2,TIBC:2,IRON:2,RETICCTPCT:2 in the last 72 hours  Micro Results: No results found for this or any previous visit (from the past 240 hour(s)).  Studies/Results: Ct Abdomen Pelvis Wo Contrast  01/28/2012  *RADIOLOGY REPORT*  Clinical Data: Abdominal pain, nausea, constipation  CT ABDOMEN AND PELVIS WITHOUT CONTRAST  Technique:  Multidetector CT imaging of the abdomen and pelvis was performed following the standard protocol without intravenous contrast.  Comparison: CT scan of the chest 01/01/2009  Findings: Chronic mild interstitial prominence noted lung bases. Unenhanced liver, spleen and right adrenal gland are unremarkable. Hyperdense left adrenal nodule measures 2.7 by 2 cm stable in size and appearance from prior exam.  Unenhanced kidneys shows no nephrolithiasis.  There is a cyst in upper pole of the left kidney measures 4.6 cm.  Cyst in mid pole posterior aspect of the right kidney measures 4.4 cm.  A cyst in lower  pole of the right kidney measures 2.5 cm.  Extensive atherosclerotic calcifications of the abdominal aorta, there is a and the iliac arteries are noted.  Oral contrast material was given to the patient.  Oral contrast material noted within stomach and proximal small bowel loops. There are distended small bowel loops in mid abdomen with multiple air-fluid levels.  Distal small bowel loops are collapsed with small caliber.  Findings are highly suspicious for small bowel obstruction.  No any contrast  material is noted within the distal small bowel or within colon.  Transition point in caliber is noted in the mid abdomen in axial image 49 and coronal image 21.  No pericecal inflammation is noted.  Significant stool noted in the distal sigmoid colon and rectum.  The rectum is distended with stool measures about 6.7 cm suspicious for mild fecal impaction. The left colon is empty collapsed.  No ascites or free air.  No adenopathy.  Prostate gland measures 5 x 3.7 cm.  The urinary bladder is unremarkable.  No destructive bony lesions are noted within pelvis. Sagittal images of the spine shows diffuse osteopenia. Degenerative changes of the lumbar spine are noted.  Significant disc space flattening noted at L4-L5 level.  Mild disc space flattening noted at L2-L3 level.  Normal appendix is partially visualized in axial image 55.  IMPRESSION:  1.  There are distended proximal small bowel loops with air-fluid levels.  The distal small bowel is smaller caliber.  Transition point in caliber noted in the mid abdomen in axial image 48. Findings are highly suspicious for small bowel obstruction.  No any contrast material noted in distal small bowel. 2.  Stable hyperdense left adrenal nodule. 3.  Bilateral renal cysts are noted.  No hydronephrosis or hydroureter. 4.  Extensive atherosclerotic vascular calcifications. 5.  Normal appendix. 6.  Moderate stool noted in the rectosigmoid colon.  There is distention of the rectum with stools suspicious for distal fecal impaction.  Original Report Authenticated By: Natasha Mead, M.D.   Dg Abd 1 View  01/29/2012  *RADIOLOGY REPORT*  Clinical Data: Small bowel obstruction.  ABDOMEN - 1 VIEW  Comparison: 01/28/2012  Findings: Supine view of the abdomen demonstrates persistent dilatation of small bowel loops.  There is gas in nondilated loops of large bowel.  No evidence for free intraperitoneal air on this supine view.  IMPRESSION: Persistent dilatation of small bowel loops.  Original  Report Authenticated By: Patterson Hammersmith, M.D.   Dg Abd Acute W/chest  01/28/2012  *RADIOLOGY REPORT*  Clinical Data: Vomiting, abdominal pain  ACUTE ABDOMEN SERIES (ABDOMEN 2 VIEW & CHEST 1 VIEW)  Comparison: None.  Findings: Cardiomediastinal silhouette is unremarkable.  The patient is status post median sternotomy and cardiac valve replacement.  Mild hyperinflation.  Probable chronic mild interstitial prominence.  Probable fibrotic changes noted lung bases.  No acute infiltrate or pulmonary edema.  Distended small bowel loops are noted mid and left upper abdomen with air-fluid levels highly suspicious for small bowel obstruction.  No free abdominal air.  IMPRESSION: No acute disease within chest.  Mild hyperinflation.  Probable chronic mild interstitial prominence.  Distended small bowel loops with air-fluid levels are noted upper abdomen suspicious for small bowel obstruction.  No free abdominal air.  Original Report Authenticated By: Natasha Mead, M.D.    Medications: I have reviewed the patient's current medications. Scheduled Meds:   . bimatoprost  1 drop Both Eyes QHS  . brinzolamide  1 drop Both Eyes BID  . metoprolol  5 mg Intravenous Q8H  . pantoprazole (PROTONIX) IV  40 mg Intravenous Q24H  . sodium chloride  3 mL Intravenous Q12H  . tiotropium  18 mcg Inhalation Daily   Continuous Infusions:   . sodium chloride    . DISCONTD: 0.9 % NaCl with KCl 40 mEq / L 100 mL/hr at 01/30/12 0424   PRN Meds:.morphine injection, ondansetron, phenol Assessment/Plan: Patient Active Hospital Problem List: SBO (small bowel obstruction) (01/28/2012)   Assessment: Clinically the patient is much improved. The patient is continued plus flatus. I will clamp his NG tube and if he tolerates the clamping for 2 hours then we will advance to clear liquids.      Warfarin-induced coagulopathy (01/28/2012)   Assessment: The patient has had further escalation in his INR today. I will go ahead and give the  patient 2 units of FFP onto he is tolerating a diet to restore his vitamin K through oral intake.    Encounter for long-term (current) use of anticoagulants (12/07/2010)   Assessment: Anticoagulants on hold secondary to supratherapeutic INR.     Acute kidney injury (01/28/2012)   Assessment: Renal function improving. We'll continue IV fluids until the patient is tolerating oral intake with consistency.    Dehydration (01/28/2012)   Assessment: Resolved      Nausea & vomiting (01/28/2012)   Assessment: Resolved     Fecal impaction of colon (01/28/2012)   Assessment: Patient had good response to enemas and I will continue to monitor his elimination.       LOS: 2 days

## 2012-01-30 NOTE — Progress Notes (Signed)
Patient ID: [name], male   DOB: [birth year], 78 y.o.   MRN: [id number] This is a re- copy of my note from yesterday 01/29/2012 that was accidentaly addended and then deleted by nurse Orlean Bradford. Please note that the original documentation occurred yesterday and the content of the note is unchanged.   Subjective:  Patient states that he feels better today he's had no further nausea or vomiting. The NG tube aspirate from this morning has been minimal. We'll continue to monitor output during the day.  Objective:  Filed Vitals:    01/28/12 1817  01/28/12 2200  01/29/12 0600  01/29/12 1430   BP:  137/77  167/82  132/67  164/75   Pulse:  88  90  78  73   Temp:  99 F (37.2 C)  98.6 F (37 C)  98.4 F (36.9 C)  99.1 F (37.3 C)   TempSrc:  Oral  Oral  Oral  Oral   Resp:  16  18  18  18    SpO2:  91%  86%  97%  98%    Weight change:   Intake/Output Summary (Last 24 hours) at 01/29/12 1835 Last data filed at 01/29/12 1550   Gross per 24 hour   Intake  2749.09 ml   Output  500 ml   Net  2249.09 ml    General: Alert, awake, oriented x3, in no acute distress.  HEENT: [place]/AT PEERL, EOMI  Neck: Trachea midline, no masses, no thyromegal,y no JVD, no carotid bruit  OROPHARYNX: Moist, No exudate/ erythema/lesions.  Heart: Regular rate and rhythm, without murmurs, rubs, gallops, PMI non-displaced, no heaves or thrills on palpation.  Lungs: Clear to auscultation, no wheezing or rhonchi noted. No increased vocal fremitus resonant to percussion  Abdomen: Soft, nontender, mildly distended, diminished bowel sounds, no masses no hepatosplenomegaly noted..  Neuro: No focal neurological deficits noted cranial nerves II through XII grossly intact. DTRs 2+ bilaterally upper and lower extremities. Strength functional in bilateral upper and lower extremities.  Musculoskeletal: No warm swelling or erythema around joints, no spinal tenderness noted  Lab Results:   [facility]  01/29/12 0523  01/28/12 1205    NA  141  138   K  4.5  4.4   CL  99  94*   CO2  35*  31   GLUCOSE  123*  162*   BUN  47*  38*   CREATININE  2.36*  2.25*   CALCIUM  9.0  10.4   MG  --  --   PHOS  --  --     Basename  01/28/12 1205   AST  36   ALT  14   ALKPHOS  130*   BILITOT  0.6   PROT  8.0   ALBUMIN  3.7     Basename  01/28/12 1205   LIPASE  15   AMYLASE  --     Basename  01/29/12 0523  01/28/12 1205   WBC  6.6  15.4*   NEUTROABS  --  12.6*   HGB  12.9*  14.7   HCT  38.4*  44.2   MCV  90.1  89.3   PLT  168  234    No results found for this basename: CKTOTAL:3,CKMB:3,CKMBINDEX:3,TROPONINI:3 in the last 72 hours  No components found with this basename: POCBNP:3  No results found for this basename: DDIMER:2 in the last 72 hours  No results found for this basename: HGBA1C:2 in the last 72 hours  No results found for this basename: CHOL:2,HDL:2,LDLCALC:2,TRIG:2,CHOLHDL:2,LDLDIRECT:2 in the last 72 hours   Basename  01/29/12 0523   TSH  2.144   T4TOTAL  --   T3FREE  --   THYROIDAB  --    No results found for this basename: VITAMINB12:2,FOLATE:2,FERRITIN:2,TIBC:2,IRON:2,RETICCTPCT:2 in the last 72 hours  Micro Results:  No results found for this or any previous visit (from the past 240 hour(s)).  Studies/Results:  Ct Abdomen Pelvis Wo Contrast  01/28/2012 *RADIOLOGY REPORT* Clinical Data: Abdominal pain, nausea, constipation CT ABDOMEN AND PELVIS WITHOUT CONTRAST Technique: Multidetector CT imaging of the abdomen and pelvis was performed following the standard protocol without intravenous contrast. Comparison: CT scan of the chest 01/01/2009 Findings: Chronic mild interstitial prominence noted lung bases. Unenhanced liver, spleen and right adrenal gland are unremarkable. Hyperdense left adrenal nodule measures 2.7 by 2 cm stable in size and appearance from prior exam. Unenhanced kidneys shows no nephrolithiasis. There is a cyst in upper pole of the left kidney measures 4.6 cm. Cyst in mid pole  posterior aspect of the right kidney measures 4.4 cm. A cyst in lower pole of the right kidney measures 2.5 cm. Extensive atherosclerotic calcifications of the abdominal aorta, there is a and the iliac arteries are noted. Oral contrast material was given to the patient. Oral contrast material noted within stomach and proximal small bowel loops. There are distended small bowel loops in mid abdomen with multiple air-fluid levels. Distal small bowel loops are collapsed with small caliber. Findings are highly suspicious for small bowel obstruction. No any contrast material is noted within the distal small bowel or within colon. Transition point in caliber is noted in the mid abdomen in axial image 49 and coronal image 21. No pericecal inflammation is noted. Significant stool noted in the distal sigmoid colon and rectum. The rectum is distended with stool measures about 6.7 cm suspicious for mild fecal impaction. The left colon is empty collapsed. No ascites or free air. No adenopathy. Prostate gland measures 5 x 3.7 cm. The urinary bladder is unremarkable. No destructive bony lesions are noted within pelvis. Sagittal images of the spine shows diffuse osteopenia. Degenerative changes of the lumbar spine are noted. Significant disc space flattening noted at L4-L5 level. Mild disc space flattening noted at L2-L3 level. Normal appendix is partially visualized in axial image 55. IMPRESSION: 1. There are distended proximal small bowel loops with air-fluid levels. The distal small bowel is smaller caliber. Transition point in caliber noted in the mid abdomen in axial image 48. Findings are highly suspicious for small bowel obstruction. No any contrast material noted in distal small bowel. 2. Stable hyperdense left adrenal nodule. 3. Bilateral renal cysts are noted. No hydronephrosis or hydroureter. 4. Extensive atherosclerotic vascular calcifications. 5. Normal appendix. 6. Moderate stool noted in the rectosigmoid colon. There  is distention of the rectum with stools suspicious for distal fecal impaction. Original Report Authenticated By: Natasha Mead, M.D.  Dg Abd Acute W/chest  01/28/2012 *RADIOLOGY REPORT* Clinical Data: Vomiting, abdominal pain ACUTE ABDOMEN SERIES (ABDOMEN 2 VIEW & CHEST 1 VIEW) Comparison: None. Findings: Cardiomediastinal silhouette is unremarkable. The patient is status post median sternotomy and cardiac valve replacement. Mild hyperinflation. Probable chronic mild interstitial prominence. Probable fibrotic changes noted lung bases. No acute infiltrate or pulmonary edema. Distended small bowel loops are noted mid and left upper abdomen with air-fluid levels highly suspicious for small bowel obstruction. No free abdominal air. IMPRESSION: No acute disease within chest. Mild hyperinflation. Probable chronic mild interstitial prominence.  Distended small bowel loops with air-fluid levels are noted upper abdomen suspicious for small bowel obstruction. No free abdominal air. Original Report Authenticated By: Natasha Mead, M.D.  Medications: I have reviewed the patient's current medications.  Scheduled Meds:  .  bimatoprost  1 drop  Both Eyes  QHS   .  brinzolamide  1 drop  Both Eyes  BID   .  metoprolol  5 mg  Intravenous  Q8H   .  pantoprazole (PROTONIX) IV  40 mg  Intravenous  Q24H   .  sodium chloride  3 mL  Intravenous  Q12H   .  sodium phosphate  1 enema  Rectal  Once   .  tiotropium  18 mcg  Inhalation  Daily   .  DISCONTD: brinzolamide  1 drop  Both Eyes  BID    Continuous Infusions:  .  0.9 % NaCl with KCl 40 mEq / L  100 mL/hr at 01/29/12 1824    PRN Meds:.morphine injection, ondansetron, phenol  Assessment/Plan:  Patient Active Hospital Problem List: SBO (small bowel obstruction) (01/28/2012) Assessment: Clinically improved. We'll obtain repeat abdominal x-ray and evaluate the NG tube aspirate through the day. If miniscule consider clamping the NG tube tomorrow and given a trial of clear  liquids.  Encounter for long-term (current) use of anticoagulants (12/07/2010) Assessment: INR even higher today. If it continues to increase may need to consider giving FFP. Presently no evidence of bleeding.  Acute kidney injury (01/28/2012) Assessment: Creatinine mildly increased. We'll continue to monitor   Dehydration (01/28/2012) Assessment: Continue IV fluids while n.p.o.   Nausea & vomiting (01/28/2012) Assessment: Resolved with placement of NG tube   Fecal impaction of colon (01/28/2012) Assessment: We'll order soapsuds enema to be administered today   Warfarin-induced coagulopathy (01/28/2012) Assessment: See above   Kayleen Alig A. MD Pager # [phone]

## 2012-01-30 NOTE — Progress Notes (Signed)
CRITICAL VALUE ALERT  Critical value received:  INR 5.76  Date of notification:  [id number]  Time of notification:  0629  Critical value read back: YES  Nurse who received alert:  Orlean Bradford  MD notified (1st page):  Claiborne Billings  Time of first page:  0629  MD notified (2nd page):  Time of second page:  Responding MD:    Time MD responded:

## 2012-01-30 NOTE — Progress Notes (Signed)
ANTICOAGULATION CONSULT NOTE - Initial Consult  Pharmacy: Consult for Heparin (when INR decreased) Indication: Hx of St. Jude AVR  Allergies  Allergen Reactions  . Penicillins Anaphylaxis   Patient Measurements:   Vital Signs: Temp: 98.3 F (36.8 C) (04/21 0554) Temp src: Oral (04/21 0554) BP: 163/79 mmHg (04/21 0554) Pulse Rate: 72  (04/21 0554)  Labs:  Basename 01/30/12 0530 01/29/12 0523 01/28/12 1205  HGB 11.7* 12.9* --  HCT 37.7* 38.4* 44.2  PLT 120* 168 234  APTT -- -- --  LABPROT 52.6* 49.2* 40.1*  INR 5.76* 5.28* 4.07*  HEPARINUNFRC -- -- --  CREATININE 1.90* 2.36* 2.25*  CKTOTAL -- -- --  CKMB -- -- --  TROPONINI -- -- --   The CrCl is unknown because both a height and weight (above a minimum accepted value) are required for this calculation.  Medical History: Past Medical History  Diagnosis Date  . CORONARY ATHEROSCLEROSIS NATIVE CORONARY ARTERY 04/23/2009  . DRY EYE SYNDROME 10/09/2008  . COPD 10/09/2008  . CAD, ARTERY BYPASS GRAFT 10/09/2008  . Aortic valve disorder 12/07/2010  . Palpitations   . Emphysema     Medications:  Scheduled:     . bimatoprost  1 drop Both Eyes QHS  . brinzolamide  1 drop Both Eyes BID  . metoprolol  5 mg Intravenous Q8H  . pantoprazole (PROTONIX) IV  40 mg Intravenous Q24H  . sodium chloride  3 mL Intravenous Q12H  . tiotropium  18 mcg Inhalation Daily   Infusions:     . sodium chloride    . DISCONTD: 0.9 % NaCl with KCl 40 mEq / L 100 mL/hr at 01/30/12 0424   Assessment:  78 yo M on chronic warfarin for hx of St. Jude's AVR.   Pt is now NPO and unable to take warfarin,current INR  remains supratherapeutic; 5.76 (from 4.07 on admit)  Doubtful that INR will decrease significantly with no po intake (no Vit K intake)    Home dose Warfarin; 5mg  MWF, 7.5mg  other days. Last dose 4/15  Goal of Therapy:  INR 2.5-3.5 (St. Jude AVR)   Plan:  1) No heparin necessary yet 2) Daily INRs 3) Watch INR trend - plan to  start heparin once INR approaches 2.5.  Otho Bellows PharmD pager: [phone] 01/30/2012,9:33 AM

## 2012-01-31 LAB — CBC
Hemoglobin: 10.8 g/dL — ABNORMAL LOW (ref 13.0–17.0)
MCH: 29.6 pg (ref 26.0–34.0)
MCHC: 32 g/dL (ref 30.0–36.0)
RDW: 12.8 % (ref 11.5–15.5)

## 2012-01-31 LAB — PREPARE FRESH FROZEN PLASMA: Unit division: 0

## 2012-01-31 LAB — DIFFERENTIAL
Basophils Absolute: 0 10*3/uL (ref 0.0–0.1)
Basophils Relative: 0 % (ref 0–1)
Eosinophils Absolute: 0.2 10*3/uL (ref 0.0–0.7)
Eosinophils Relative: 3 % (ref 0–5)
Monocytes Absolute: 0.8 10*3/uL (ref 0.1–1.0)

## 2012-01-31 LAB — BASIC METABOLIC PANEL
BUN: 32 mg/dL — ABNORMAL HIGH (ref 6–23)
Calcium: 8.1 mg/dL — ABNORMAL LOW (ref 8.4–10.5)
Creatinine, Ser: 1.4 mg/dL — ABNORMAL HIGH (ref 0.50–1.35)
GFR calc non Af Amer: 46 mL/min — ABNORMAL LOW (ref >90)
Glucose, Bld: 107 mg/dL — ABNORMAL HIGH (ref 70–99)
Potassium: 4.3 mEq/L (ref 3.5–5.1)

## 2012-01-31 LAB — HEPARIN LEVEL (UNFRACTIONATED): Heparin Unfractionated: 0.34 IU/mL (ref 0.30–0.70)

## 2012-01-31 MED ORDER — HEPARIN (PORCINE) IN NACL 100-0.45 UNIT/ML-% IJ SOLN
1000.0000 [IU]/h | INTRAMUSCULAR | Status: DC
  Administered 2012-01-31: 1000 [IU]/h via INTRAVENOUS
  Filled 2012-01-31 (×2): qty 250

## 2012-01-31 NOTE — Progress Notes (Signed)
ANTICOAGULATION CONSULT NOTE - Follow Up Consult  Pharmacy Consult for Heparin Indication: Hx St Jude AVR  Allergies  Allergen Reactions  . Penicillins Anaphylaxis    Patient Measurements: Height: 6\' 2"  (188 cm) Weight: 163 lb 2.3 oz (74 kg) IBW/kg (Calculated) : 82.2   Vital Signs: Temp: 98.3 F (36.8 C) (04/22 0630) Temp src: Oral (04/22 0630) BP: 157/72 mmHg (04/22 0630) Pulse Rate: 74  (04/22 0630)  Labs:  Basename 01/31/12 0625 01/30/12 0530 01/29/12 0523  HGB 10.8* 11.7* --  HCT 33.7* 37.7* 38.4*  PLT 121* 120* 168  APTT -- -- --  LABPROT 28.8* 52.6* 49.2*  INR 2.66* 5.76* 5.28*  HEPARINUNFRC -- -- --  CREATININE 1.40* 1.90* 2.36*  CKTOTAL -- -- --  CKMB -- -- --  TROPONINI -- -- --   Estimated Creatinine Clearance: 44.8 ml/min (by C-G formula based on Cr of 1.4).   Medications:  Scheduled:    . bimatoprost  1 drop Both Eyes QHS  . brinzolamide  1 drop Both Eyes BID  . metoprolol  5 mg Intravenous Q8H  . pantoprazole (PROTONIX) IV  40 mg Intravenous Q24H  . sodium chloride  3 mL Intravenous Q12H  . tiotropium  18 mcg Inhalation Daily   Infusions:    . sodium chloride 1,000 mL (01/30/12 1200)   PRN: morphine injection, ondansetron, phenol  Assessment: 78 yo M on chronic warfarin for hx of St. Jude's AVR. Pt has CLD but not currently taking in any PO. Pt received FFP 4/21. Pt's current INR is 2.66. Discussed this with Dr. Ashley Royalty - decision made to start IV heparin now (no bolus) to avoid INR < 2.5 and since pt not taking POs yet. Will start at lower end of heparin dosing range with NO bolus given therapeutic INR.  Goal of Therapy:  Heparin level 0.3-0.7 units/ml   Plan:  1) Heparin 1000 units/hr (10 ml/hr) - NO BOLUS 2) Heparin level 8 hours after #1 started 3) Continue daily INRs, add daily CBCs. 4) Await plans on when to resume warfarin.  Darrol Angel, PharmD Pager: [phone] 01/31/2012,11:03 AM

## 2012-01-31 NOTE — Progress Notes (Signed)
ANTICOAGULATION CONSULT NOTE - Follow Up Consult  Pharmacy Consult for Heparin Indication: Hx St Jude AVR  Allergies  Allergen Reactions  . Penicillins Anaphylaxis    Patient Measurements: Height: 6\' 2"  (188 cm) Weight: 163 lb 2.3 oz (74 kg) IBW/kg (Calculated) : 82.2    Vital Signs: Temp: 98.8 F (37.1 C) (04/22 1418) Temp src: Oral (04/22 1418) BP: 162/80 mmHg (04/22 1418) Pulse Rate: 69  (04/22 1418)  Labs:  Basename 01/31/12 1954 01/31/12 0625 01/30/12 0530 01/29/12 0523  HGB -- 10.8* 11.7* --  HCT -- 33.7* 37.7* 38.4*  PLT -- 121* 120* 168  APTT -- -- -- --  LABPROT -- 28.8* 52.6* 49.2*  INR -- 2.66* 5.76* 5.28*  HEPARINUNFRC 0.34 -- -- --  CREATININE -- 1.40* 1.90* 2.36*  CKTOTAL -- -- -- --  CKMB -- -- -- --  TROPONINI -- -- -- --   Estimated Creatinine Clearance: 44.8 ml/min (by C-G formula based on Cr of 1.4).   Medications:  Scheduled:    . bimatoprost  1 drop Both Eyes QHS  . brinzolamide  1 drop Both Eyes BID  . metoprolol  5 mg Intravenous Q8H  . pantoprazole (PROTONIX) IV  40 mg Intravenous Q24H  . sodium chloride  3 mL Intravenous Q12H  . tiotropium  18 mcg Inhalation Daily    Assessment: 78 yo M on chronic warfarin for hx of St. Jude's AVR. Pt has CLD but not currently taking in any PO. Pt received FFP 4/21. Pt's current INR is 2.66. This was discussed with Dr. Ashley Royalty - decision made to start IV heparin now (no bolus) to avoid INR < 2.5 and since pt not taking POs yet.  First level is therapeutic 0.34.   Goal of Therapy:  Heparin level 0.3-0.7 units/ml   Plan:   Continue heparin 1000 units/hr  Recheck level with am labs   Loralee Pacas, PharmD, BCPS Pager: [phone] 01/31/2012,8:46 PM

## 2012-01-31 NOTE — Progress Notes (Signed)
Subjective: Patient states he feels much better today and tolerated clear liquids through the day yesterday and for most of the day today. The patient has had no nausea vomiting by the NG tube was clamped. He has  been passing flatus son several occasions today.  Interval history: INR has drifted down to 2.66 which is subtherapeutic for his valve Objective: Filed Vitals:   01/30/12 2220 01/31/12 0630 01/31/12 0901 01/31/12 1418  BP: 164/81 157/72  162/80  Pulse: 77 74  69  Temp: 98.4 F (36.9 C) 98.3 F (36.8 C)  98.8 F (37.1 C)  TempSrc: Oral Oral  Oral  Resp: 18 18  18   Height:  6\' 2"  (1.88 m)    Weight:  74 kg (163 lb 2.3 oz)    SpO2: 98% 92% 95% 95%   Weight change:   Intake/Output Summary (Last 24 hours) at 01/31/12 1955 Last data filed at 01/30/12 2145  Gross per 24 hour  Intake    300 ml  Output      0 ml  Net    300 ml    General: Alert, awake, oriented x3, in no acute distress.  HEENT: /AT PEERL, EOMI Neck: Trachea midline,  no masses, no thyromegal,y no JVD, no carotid bruit OROPHARYNX:  Moist, No exudate/ erythema/lesions.  Heart: Regular rate and rhythm, without murmurs, rubs, gallops, PMI non-displaced, no heaves or thrills on palpation.  Lungs: Clear to auscultation, no wheezing or rhonchi noted. No increased vocal fremitus resonant to percussion  Abdomen: Soft, nontender, nondistended, improved bowel sounds, no masses no hepatosplenomegaly noted.   Lab Results:  Basename 01/31/12 0625 01/30/12 0530  NA 141 143  K 4.3 5.3*  CL 109 109  CO2 27 30  GLUCOSE 107* 115*  BUN 32* 46*  CREATININE 1.40* 1.90*  CALCIUM 8.1* 8.4  MG -- --  PHOS -- --   No results found for this basename: AST:2,ALT:2,ALKPHOS:2,BILITOT:2,PROT:2,ALBUMIN:2 in the last 72 hours No results found for this basename: LIPASE:2,AMYLASE:2 in the last 72 hours  Basename 01/31/12 0625 01/31/12 0509 01/30/12 0530  WBC 6.9 -- 5.2  NEUTROABS -- 4.6 3.4  HGB 10.8* -- 11.7*  HCT 33.7* --  37.7*  MCV 92.3 -- 94.3  PLT 121* -- 120*   No results found for this basename: CKTOTAL:3,CKMB:3,CKMBINDEX:3,TROPONINI:3 in the last 72 hours No components found with this basename: POCBNP:3 No results found for this basename: DDIMER:2 in the last 72 hours No results found for this basename: HGBA1C:2 in the last 72 hours No results found for this basename: CHOL:2,HDL:2,LDLCALC:2,TRIG:2,CHOLHDL:2,LDLDIRECT:2 in the last 72 hours  Basename 01/29/12 0523  TSH 2.144  T4TOTAL --  T3FREE --  THYROIDAB --   No results found for this basename: VITAMINB12:2,FOLATE:2,FERRITIN:2,TIBC:2,IRON:2,RETICCTPCT:2 in the last 72 hours  Micro Results: No results found for this or any previous visit (from the past 240 hour(s)).  Studies/Results: Ct Abdomen Pelvis Wo Contrast  01/28/2012  *RADIOLOGY REPORT*  Clinical Data: Abdominal pain, nausea, constipation  CT ABDOMEN AND PELVIS WITHOUT CONTRAST  Technique:  Multidetector CT imaging of the abdomen and pelvis was performed following the standard protocol without intravenous contrast.  Comparison: CT scan of the chest 01/01/2009  Findings: Chronic mild interstitial prominence noted lung bases. Unenhanced liver, spleen and right adrenal gland are unremarkable. Hyperdense left adrenal nodule measures 2.7 by 2 cm stable in size and appearance from prior exam.  Unenhanced kidneys shows no nephrolithiasis.  There is a cyst in upper pole of the left kidney measures 4.6 cm.  Cyst in mid pole posterior aspect of the right kidney measures 4.4 cm.  A cyst in lower pole of the right kidney measures 2.5 cm.  Extensive atherosclerotic calcifications of the abdominal aorta, there is a and the iliac arteries are noted.  Oral contrast material was given to the patient.  Oral contrast material noted within stomach and proximal small bowel loops. There are distended small bowel loops in mid abdomen with multiple air-fluid levels.  Distal small bowel loops are collapsed with small  caliber.  Findings are highly suspicious for small bowel obstruction.  No any contrast material is noted within the distal small bowel or within colon.  Transition point in caliber is noted in the mid abdomen in axial image 49 and coronal image 21.  No pericecal inflammation is noted.  Significant stool noted in the distal sigmoid colon and rectum.  The rectum is distended with stool measures about 6.7 cm suspicious for mild fecal impaction. The left colon is empty collapsed.  No ascites or free air.  No adenopathy.  Prostate gland measures 5 x 3.7 cm.  The urinary bladder is unremarkable.  No destructive bony lesions are noted within pelvis. Sagittal images of the spine shows diffuse osteopenia. Degenerative changes of the lumbar spine are noted.  Significant disc space flattening noted at L4-L5 level.  Mild disc space flattening noted at L2-L3 level.  Normal appendix is partially visualized in axial image 55.  IMPRESSION:  1.  There are distended proximal small bowel loops with air-fluid levels.  The distal small bowel is smaller caliber.  Transition point in caliber noted in the mid abdomen in axial image 48. Findings are highly suspicious for small bowel obstruction.  No any contrast material noted in distal small bowel. 2.  Stable hyperdense left adrenal nodule. 3.  Bilateral renal cysts are noted.  No hydronephrosis or hydroureter. 4.  Extensive atherosclerotic vascular calcifications. 5.  Normal appendix. 6.  Moderate stool noted in the rectosigmoid colon.  There is distention of the rectum with stools suspicious for distal fecal impaction.  Original Report Authenticated By: Natasha Mead, M.D.   Dg Abd 1 View  01/29/2012  *RADIOLOGY REPORT*  Clinical Data: Small bowel obstruction.  ABDOMEN - 1 VIEW  Comparison: 01/28/2012  Findings: Supine view of the abdomen demonstrates persistent dilatation of small bowel loops.  There is gas in nondilated loops of large bowel.  No evidence for free intraperitoneal air on  this supine view.  IMPRESSION: Persistent dilatation of small bowel loops.  Original Report Authenticated By: Patterson Hammersmith, M.D.   Dg Abd Acute W/chest  01/28/2012  *RADIOLOGY REPORT*  Clinical Data: Vomiting, abdominal pain  ACUTE ABDOMEN SERIES (ABDOMEN 2 VIEW & CHEST 1 VIEW)  Comparison: None.  Findings: Cardiomediastinal silhouette is unremarkable.  The patient is status post median sternotomy and cardiac valve replacement.  Mild hyperinflation.  Probable chronic mild interstitial prominence.  Probable fibrotic changes noted lung bases.  No acute infiltrate or pulmonary edema.  Distended small bowel loops are noted mid and left upper abdomen with air-fluid levels highly suspicious for small bowel obstruction.  No free abdominal air.  IMPRESSION: No acute disease within chest.  Mild hyperinflation.  Probable chronic mild interstitial prominence.  Distended small bowel loops with air-fluid levels are noted upper abdomen suspicious for small bowel obstruction.  No free abdominal air.  Original Report Authenticated By: Natasha Mead, M.D.    Medications: I have reviewed the patient's current medications. Scheduled Meds:    .  bimatoprost  1 drop Both Eyes QHS  . brinzolamide  1 drop Both Eyes BID  . metoprolol  5 mg Intravenous Q8H  . pantoprazole (PROTONIX) IV  40 mg Intravenous Q24H  . sodium chloride  3 mL Intravenous Q12H  . tiotropium  18 mcg Inhalation Daily   Continuous Infusions:    . sodium chloride 1,000 mL (01/30/12 1200)  . heparin 1,000 Units/hr (01/31/12 1213)   PRN Meds:.morphine injection, ondansetron, phenol Assessment/Plan: Patient Active Hospital Problem List: SBO (small bowel obstruction) (01/28/2012)   Assessment: Clinically the patient is much improved. He has tolerated clear liquids for the past 24 hours. I will advance patient to full liquids and DC the NG tube      Warfarin-induced coagulopathy (01/28/2012)   Assessment: The patient's INR history down to 2.66  after receiving FFP yesterday. I spoken with the pharmacist and they will initiate IV heparin until the patient's consistently accepted oral without difficulty.    Encounter for long-term (current) use of anticoagulants (12/07/2010)   Assessment: See above     Acute kidney injury (01/28/2012)   Assessment: Renal function improving. We'll continue IV fluids until the patient is tolerating oral intake with consistency.    Dehydration (01/28/2012)   Assessment: Resolved      Nausea & vomiting (01/28/2012)   Assessment: Resolved     Fecal impaction of colon (01/28/2012)   Assessment: Resolved.       LOS: 3 days

## 2012-02-01 LAB — DIFFERENTIAL
Basophils Relative: 0 % (ref 0–1)
Eosinophils Relative: 6 % — ABNORMAL HIGH (ref 0–5)
Lymphs Abs: 1.4 10*3/uL (ref 0.7–4.0)
Monocytes Absolute: 0.8 10*3/uL (ref 0.1–1.0)

## 2012-02-01 LAB — BASIC METABOLIC PANEL
BUN: 23 mg/dL (ref 6–23)
CO2: 24 mEq/L (ref 19–32)
Chloride: 105 mEq/L (ref 96–112)
Creatinine, Ser: 1.29 mg/dL (ref 0.50–1.35)

## 2012-02-01 LAB — CBC
MCH: 29.9 pg (ref 26.0–34.0)
MCHC: 32.7 g/dL (ref 30.0–36.0)
MCV: 91.3 fL (ref 78.0–100.0)
Platelets: 114 10*3/uL — ABNORMAL LOW (ref 150–400)
RBC: 3.55 MIL/uL — ABNORMAL LOW (ref 4.22–5.81)

## 2012-02-01 MED ORDER — ATORVASTATIN CALCIUM 10 MG PO TABS
10.0000 mg | ORAL_TABLET | Freq: Every day | ORAL | Status: DC
  Administered 2012-02-01 – 2012-02-03 (×3): 10 mg via ORAL
  Filled 2012-02-01 (×4): qty 1

## 2012-02-01 MED ORDER — METOPROLOL TARTRATE 25 MG PO TABS
25.0000 mg | ORAL_TABLET | Freq: Every day | ORAL | Status: DC
  Administered 2012-02-01 – 2012-02-04 (×4): 25 mg via ORAL
  Filled 2012-02-01 (×4): qty 1

## 2012-02-01 MED ORDER — VITAMIN D3 25 MCG (1000 UNIT) PO TABS
1000.0000 [IU] | ORAL_TABLET | Freq: Every day | ORAL | Status: DC
  Administered 2012-02-01 – 2012-02-04 (×4): 1000 [IU] via ORAL
  Filled 2012-02-01 (×4): qty 1

## 2012-02-01 MED ORDER — ASPIRIN EC 81 MG PO TBEC
81.0000 mg | DELAYED_RELEASE_TABLET | Freq: Every day | ORAL | Status: DC
  Administered 2012-02-01 – 2012-02-04 (×4): 81 mg via ORAL
  Filled 2012-02-01 (×4): qty 1

## 2012-02-01 NOTE — Progress Notes (Signed)
UR complete 

## 2012-02-01 NOTE — Progress Notes (Signed)
Subjective: Patient states he feels much better today and tolerated grits today and has continued to pass flatus. He has no appreciable distension and denies any abdominal pain nausea or vomiting. Pt is well appearing today.  Interval history: INR now up to  2.77 which is therapeutic for his valve.  Objective: Filed Vitals:   01/31/12 2200 02/01/12 0617 02/01/12 0644 02/01/12 0955  BP: 157/71 167/78 143/58   Pulse: 67 68 69   Temp: 98.1 F (36.7 C) 98 F (36.7 C)    TempSrc: Oral Oral    Resp: 17 18    Height:      Weight:      SpO2: 95% 95%  92%   Weight change:   Intake/Output Summary (Last 24 hours) at 02/01/12 1053 Last data filed at 02/01/12 0903  Gross per 24 hour  Intake   1440 ml  Output    600 ml  Net    840 ml    General: Alert, awake, oriented x3, in no acute distress.Pt well appearing. HEENT: [place]/AT PEERL, EOMI Neck: Trachea midline,  no masses, no thyromegal,y no JVD, no carotid bruit OROPHARYNX:  Moist, No exudate/ erythema/lesions.  Heart: Regular rate and rhythm, without murmurs, rubs, gallops, PMI non-displaced, no heaves or thrills on palpation.  Lungs: Clear to auscultation, no wheezing or rhonchi noted. No increased vocal fremitus resonant to percussion  Abdomen: Soft, nontender, nondistended, improved bowel sounds, no masses no hepatosplenomegaly noted. Extremities:  No clubbing/cyanosis/edema.   Lab Results:  Basename 02/01/12 0520 01/31/12 0625  NA 134* 141  K 4.0 4.3  CL 105 109  CO2 24 27  GLUCOSE 97 107*  BUN 23 32*  CREATININE 1.29 1.40*  CALCIUM 7.9* 8.1*  MG -- --  PHOS -- --   No results found for this basename: AST:2,ALT:2,ALKPHOS:2,BILITOT:2,PROT:2,ALBUMIN:2 in the last 72 hours No results found for this basename: LIPASE:2,AMYLASE:2 in the last 72 hours  Basename 02/01/12 0520 01/31/12 0625 01/31/12 0509  WBC 6.9 6.9 --  NEUTROABS 4.3 -- 4.6  HGB 10.6* 10.8* --  HCT 32.4* 33.7* --  MCV 91.3 92.3 --  PLT 114* 121* --   No  results found for this basename: CKTOTAL:3,CKMB:3,CKMBINDEX:3,TROPONINI:3 in the last 72 hours No components found with this basename: POCBNP:3 No results found for this basename: DDIMER:2 in the last 72 hours No results found for this basename: HGBA1C:2 in the last 72 hours No results found for this basename: CHOL:2,HDL:2,LDLCALC:2,TRIG:2,CHOLHDL:2,LDLDIRECT:2 in the last 72 hours No results found for this basename: TSH,T4TOTAL,FREET3,T3FREE,THYROIDAB in the last 72 hours No results found for this basename: VITAMINB12:2,FOLATE:2,FERRITIN:2,TIBC:2,IRON:2,RETICCTPCT:2 in the last 72 hours  Micro Results: No results found for this or any previous visit (from the past 240 hour(s)).  Studies/Results: Ct Abdomen Pelvis Wo Contrast  01/28/2012  *RADIOLOGY REPORT*  Clinical Data: Abdominal pain, nausea, constipation  CT ABDOMEN AND PELVIS WITHOUT CONTRAST  Technique:  Multidetector CT imaging of the abdomen and pelvis was performed following the standard protocol without intravenous contrast.  Comparison: CT scan of the chest 01/01/2009  Findings: Chronic mild interstitial prominence noted lung bases. Unenhanced liver, spleen and right adrenal gland are unremarkable. Hyperdense left adrenal nodule measures 2.7 by 2 cm stable in size and appearance from prior exam.  Unenhanced kidneys shows no nephrolithiasis.  There is a cyst in upper pole of the left kidney measures 4.6 cm.  Cyst in mid pole posterior aspect of the right kidney measures 4.4 cm.  A cyst in lower pole of the right kidney  measures 2.5 cm.  Extensive atherosclerotic calcifications of the abdominal aorta, there is a and the iliac arteries are noted.  Oral contrast material was given to the patient.  Oral contrast material noted within stomach and proximal small bowel loops. There are distended small bowel loops in mid abdomen with multiple air-fluid levels.  Distal small bowel loops are collapsed with small caliber.  Findings are highly suspicious  for small bowel obstruction.  No any contrast material is noted within the distal small bowel or within colon.  Transition point in caliber is noted in the mid abdomen in axial image 49 and coronal image 21.  No pericecal inflammation is noted.  Significant stool noted in the distal sigmoid colon and rectum.  The rectum is distended with stool measures about 6.7 cm suspicious for mild fecal impaction. The left colon is empty collapsed.  No ascites or free air.  No adenopathy.  Prostate gland measures 5 x 3.7 cm.  The urinary bladder is unremarkable.  No destructive bony lesions are noted within pelvis. Sagittal images of the spine shows diffuse osteopenia. Degenerative changes of the lumbar spine are noted.  Significant disc space flattening noted at L4-L5 level.  Mild disc space flattening noted at L2-L3 level.  Normal appendix is partially visualized in axial image 55.  IMPRESSION:  1.  There are distended proximal small bowel loops with air-fluid levels.  The distal small bowel is smaller caliber.  Transition point in caliber noted in the mid abdomen in axial image 48. Findings are highly suspicious for small bowel obstruction.  No any contrast material noted in distal small bowel. 2.  Stable hyperdense left adrenal nodule. 3.  Bilateral renal cysts are noted.  No hydronephrosis or hydroureter. 4.  Extensive atherosclerotic vascular calcifications. 5.  Normal appendix. 6.  Moderate stool noted in the rectosigmoid colon.  There is distention of the rectum with stools suspicious for distal fecal impaction.  Original Report Authenticated By: Natasha Mead, M.D.   Dg Abd 1 View  01/29/2012  *RADIOLOGY REPORT*  Clinical Data: Small bowel obstruction.  ABDOMEN - 1 VIEW  Comparison: 01/28/2012  Findings: Supine view of the abdomen demonstrates persistent dilatation of small bowel loops.  There is gas in nondilated loops of large bowel.  No evidence for free intraperitoneal air on this supine view.  IMPRESSION: Persistent  dilatation of small bowel loops.  Original Report Authenticated By: Patterson Hammersmith, M.D.   Dg Abd Acute W/chest  01/28/2012  *RADIOLOGY REPORT*  Clinical Data: Vomiting, abdominal pain  ACUTE ABDOMEN SERIES (ABDOMEN 2 VIEW & CHEST 1 VIEW)  Comparison: None.  Findings: Cardiomediastinal silhouette is unremarkable.  The patient is status post median sternotomy and cardiac valve replacement.  Mild hyperinflation.  Probable chronic mild interstitial prominence.  Probable fibrotic changes noted lung bases.  No acute infiltrate or pulmonary edema.  Distended small bowel loops are noted mid and left upper abdomen with air-fluid levels highly suspicious for small bowel obstruction.  No free abdominal air.  IMPRESSION: No acute disease within chest.  Mild hyperinflation.  Probable chronic mild interstitial prominence.  Distended small bowel loops with air-fluid levels are noted upper abdomen suspicious for small bowel obstruction.  No free abdominal air.  Original Report Authenticated By: Natasha Mead, M.D.    Medications: I have reviewed the patient's current medications. Scheduled Meds:    . bimatoprost  1 drop Both Eyes QHS  . brinzolamide  1 drop Both Eyes BID  . metoprolol  5 mg Intravenous  Q8H  . pantoprazole (PROTONIX) IV  40 mg Intravenous Q24H  . sodium chloride  3 mL Intravenous Q12H  . tiotropium  18 mcg Inhalation Daily   Continuous Infusions:    . sodium chloride 100 mL/hr at 02/01/12 0511  . DISCONTD: heparin 1,000 Units/hr (01/31/12 1213)   PRN Meds:.morphine injection, ondansetron, phenol Assessment/Plan: Patient Active Hospital Problem List: SBO (small bowel obstruction) (01/28/2012)   Assessment: Clinically the patient is much improved.Presently he is tolerating full liquids. Will continue full liquids through today and reassess tomorrow. If he tolerates current diet well then he can be advanced to heart healthy diet tomorrow.   Warfarin-induced coagulopathy (01/28/2012)    Assessment: The patient's INR is up to 2.77. Pt can resume coumadin per pharmacy dosing.  Encounter for long-term (current) use of anticoagulants (12/07/2010)   Assessment: See above     Acute kidney injury (01/28/2012)   Assessment: Renal function back to baseline.    Dehydration (01/28/2012)   Assessment: Resolved      Nausea & vomiting (01/28/2012)   Assessment: Resolved. Will resume oral medications.    Fecal impaction of colon (01/28/2012)   Assessment: Resolved.       LOS: 4 days

## 2012-02-01 NOTE — Progress Notes (Signed)
ANTICOAGULATION CONSULT NOTE - Follow Up Consult  Pharmacy Consult for Heparin Indication: Hx St Jude AVR  Allergies  Allergen Reactions  . Penicillins Anaphylaxis    Patient Measurements: Height: 6\' 2"  (188 cm) Weight: 163 lb 2.3 oz (74 kg) IBW/kg (Calculated) : 82.2   Vital Signs: Temp: 98 F (36.7 C) (04/23 0617) Temp src: Oral (04/23 0617) BP: 143/58 mmHg (04/23 0644) Pulse Rate: 69  (04/23 0644)  Labs:  Alvira Philips 02/01/12 0740 02/01/12 0520 01/31/12 1954 01/31/12 0625 01/30/12 0530  HGB -- 10.6* -- 10.8* --  HCT -- 32.4* -- 33.7* 37.7*  PLT -- 114* -- 121* 120*  APTT -- -- -- -- --  LABPROT -- 29.7* -- 28.8* 52.6*  INR -- 2.77* -- 2.66* 5.76*  HEPARINUNFRC 0.45 -- 0.34 -- --  CREATININE -- 1.29 -- 1.40* 1.90*  CKTOTAL -- -- -- -- --  CKMB -- -- -- -- --  TROPONINI -- -- -- -- --   Estimated Creatinine Clearance: 48.6 ml/min (by C-G formula based on Cr of 1.29).   Medications:  Scheduled:     . bimatoprost  1 drop Both Eyes QHS  . brinzolamide  1 drop Both Eyes BID  . metoprolol  5 mg Intravenous Q8H  . pantoprazole (PROTONIX) IV  40 mg Intravenous Q24H  . sodium chloride  3 mL Intravenous Q12H  . tiotropium  18 mcg Inhalation Daily   Infusions:     . sodium chloride 100 mL/hr at 02/01/12 0511  . DISCONTD: heparin 1,000 Units/hr (01/31/12 1213)   PRN: morphine injection, ondansetron, phenol  Assessment: 78 yo M on chronic warfarin for hx of St. Jude's AVR. Pt has CLD but not currently taking in any PO. Pt received FFP 4/21. Pt's current INR is unexpectedly up to 2.77, up from 2.66 yesterday, and still within therapeutic range. Will d/c IV heparin and plan to resume IV heparin once INR <2.5.  Goal of Therapy:  INR 2.5-3.5   Plan:  1) D/C heparin, heparin levels, and daily CBC. 2) Plan to restart IV heparin once INR < 2.5. 3) Continue daily INRs. 4) Await plans on when to resume warfarin.  Darrol Angel, PharmD Pager: [phone] 02/01/2012,8:27  AM

## 2012-02-02 LAB — HEPARIN LEVEL (UNFRACTIONATED): Heparin Unfractionated: 0.17 IU/mL — ABNORMAL LOW (ref 0.30–0.70)

## 2012-02-02 MED ORDER — WARFARIN - PHARMACIST DOSING INPATIENT: Freq: Every day | Status: DC

## 2012-02-02 MED ORDER — HEPARIN BOLUS VIA INFUSION
2000.0000 [IU] | INTRAVENOUS | Status: AC
  Administered 2012-02-02: 2000 [IU] via INTRAVENOUS
  Filled 2012-02-02: qty 2000

## 2012-02-02 MED ORDER — HEPARIN SODIUM (PORCINE) 1000 UNIT/ML IJ SOLN
2000.0000 [IU] | Freq: Once | INTRAMUSCULAR | Status: DC
  Filled 2012-02-02: qty 2

## 2012-02-02 MED ORDER — WARFARIN SODIUM 5 MG PO TABS
5.0000 mg | ORAL_TABLET | Freq: Once | ORAL | Status: AC
  Administered 2012-02-02: 5 mg via ORAL
  Filled 2012-02-02: qty 1

## 2012-02-02 MED ORDER — HEPARIN (PORCINE) IN NACL 100-0.45 UNIT/ML-% IJ SOLN
1200.0000 [IU]/h | INTRAMUSCULAR | Status: DC
  Administered 2012-02-02 – 2012-02-03 (×2): 1000 [IU]/h via INTRAVENOUS
  Filled 2012-02-02 (×3): qty 250

## 2012-02-02 NOTE — Progress Notes (Addendum)
ANTICOAGULATION CONSULT NOTE - Follow Up Consult  Pharmacy Consult for Heparin and Warfarin Indication: St Jude AVR  Allergies  Allergen Reactions  . Penicillins Anaphylaxis    Patient Measurements: Height: 6\' 2"  (188 cm) Weight: 163 lb 2.3 oz (74 kg) IBW/kg (Calculated) : 82.2   Vital Signs: Temp: 97.8 F (36.6 C) (04/24 0544) Temp src: Oral (04/24 0544) BP: 168/77 mmHg (04/24 0544) Pulse Rate: 63  (04/24 0544)  Labs:  Alvira Philips 02/02/12 0510 02/01/12 0740 02/01/12 0520 01/31/12 1954 01/31/12 0625  HGB -- -- 10.6* -- 10.8*  HCT -- -- 32.4* -- 33.7*  PLT -- -- 114* -- 121*  APTT -- -- -- -- --  LABPROT 25.0* -- 29.7* -- 28.8*  INR 2.22* -- 2.77* -- 2.66*  HEPARINUNFRC -- 0.45 -- 0.34 --  CREATININE -- -- 1.29 -- 1.40*  CKTOTAL -- -- -- -- --  CKMB -- -- -- -- --  TROPONINI -- -- -- -- --   Estimated Creatinine Clearance: 48.6 ml/min (by C-G formula based on Cr of 1.29).   Medications:  Scheduled:     . aspirin EC  81 mg Oral Daily  . atorvastatin  10 mg Oral QPC supper  . bimatoprost  1 drop Both Eyes QHS  . brinzolamide  1 drop Both Eyes BID  . cholecalciferol  1,000 Units Oral Daily  . metoprolol  25 mg Oral Daily  . pantoprazole (PROTONIX) IV  40 mg Intravenous Q24H  . sodium chloride  3 mL Intravenous Q12H  . tiotropium  18 mcg Inhalation Daily  . Warfarin - Pharmacist Dosing Inpatient   Does not apply q1800  . DISCONTD: metoprolol  5 mg Intravenous Q8H   Infusions:     . sodium chloride 100 mL/hr at 02/02/12 0600  . heparin     PRN: morphine injection, ondansetron, phenol  Assessment:  78 y/o M on chronic warfarin for prosthetic heart valve (St Jude's AVR), admitted with SBO and supratherapeutic INR.  2 units FFP were given on 4/21.  Was on IV heparin 4/22-4/23, then discontinued after INR drifted back up to 2.77.  INR subtherapeutic this AM  MD request for pharmacy to resume warfarin per protocol noted.  According to [facility] from [facility], patient's most recent home warfarin dosage was 5mg  MWF, 7.5mg  on all other days.  Beginning solid food (Heart Healthy Diet) today.  Goal of Therapy:  INR 2.5-3.5 Heparin level 0.3-0.7   Plan:  1) Resume heparin at 1000 units/hr, titrate to maintain therapeutic level.  Continue until INR therapeutic. 2) Resume warfarin with 5mg  PO today x 1 3) Continue daily INRs.  Elie Goody, PharmD, BCPS Pager: [phone] 02/02/2012  8:47 AM

## 2012-02-02 NOTE — Progress Notes (Signed)
ANTICOAGULATION  - Brief Note  Pharmacy Consult for Heparin Indication: St. Jude AVR  The current Heparin infusion rate is 1000 units/hr.  The Heparin level drawn at 16:54 today is reported as 0.17 units/ml.  This level is below  the therapeutic range, 0.3-0.7 units/ml.  Assessment: The same infusion rate produced a therapeutic level of 0.45 units/ml on 02/01/12.  When questioned about interruptions in the infusion, the patient's nurse stated that the Heparin was off for about an hour around noon while the patient took a shower and shaved.  Plan:   Heparin 2000 units bolus dose x 1  Continue infusion at 1000 units/hr  Repeat Heparin level 6 hours after bolus dose.  Polo Riley R.Ph. 02/02/2012,7:32 PM

## 2012-02-02 NOTE — Progress Notes (Signed)
Subjective: No complaints. Passing flatus, no BM. No abdominal pain.  Objective: Vital signs in last 24 hours: Temp:  [97.8 F (36.6 C)-98.7 F (37.1 C)] 98.7 F (37.1 C) (04/24 1426) Pulse Rate:  [63-72] 66  (04/24 1426) Resp:  [16-18] 18  (04/24 1426) BP: (139-168)/(67-80) 154/67 mmHg (04/24 1426) SpO2:  [95 %-97 %] 97 % (04/24 1426) Weight change:  Last BM Date: 01/28/12  Intake/Output from previous day: 04/23 0701 - 04/24 0700 In: 3216 [P.O.:720; I.V.:2486; IV Piggyback:10] Out: 2350 [Urine:2350] Total I/O In: 480 [P.O.:480] Out: 500 [Urine:500]   Physical Exam: General: Alert, awake, oriented x3, in no acute distress. HEENT: No bruits, no goiter. Heart: Regular rate and rhythm, without murmurs, rubs, gallops. Lungs: Clear to auscultation bilaterally. Abdomen: Soft, nontender, nondistended, positive bowel sounds. Extremities: No clubbing cyanosis or edema with positive pedal pulses. Neuro: Grossly intact, nonfocal.    Lab Results: Basic Metabolic Panel:  Basename 02/01/12 0520 01/31/12 0625  NA 134* 141  K 4.0 4.3  CL 105 109  CO2 24 27  GLUCOSE 97 107*  BUN 23 32*  CREATININE 1.29 1.40*  CALCIUM 7.9* 8.1*  MG -- --  PHOS -- --   CBC:  Basename 02/01/12 0520 01/31/12 0625 01/31/12 0509  WBC 6.9 6.9 --  NEUTROABS 4.3 -- 4.6  HGB 10.6* 10.8* --  HCT 32.4* 33.7* --  MCV 91.3 92.3 --  PLT 114* 121* --   Coagulation:  Basename 02/02/12 0510 02/01/12 0520  LABPROT 25.0* 29.7*  INR 2.22* 2.77*    Studies/Results: No results found.  Medications: Scheduled Meds:    . aspirin EC  81 mg Oral Daily  . atorvastatin  10 mg Oral QPC supper  . bimatoprost  1 drop Both Eyes QHS  . brinzolamide  1 drop Both Eyes BID  . cholecalciferol  1,000 Units Oral Daily  . metoprolol  25 mg Oral Daily  . pantoprazole (PROTONIX) IV  40 mg Intravenous Q24H  . sodium chloride  3 mL Intravenous Q12H  . tiotropium  18 mcg Inhalation Daily  . warfarin  5 mg Oral  ONCE-1800  . Warfarin - Pharmacist Dosing Inpatient   Does not apply q1800   Continuous Infusions:    . sodium chloride 100 mL/hr at 02/02/12 1123  . heparin 1,000 Units/hr (02/02/12 0950)   PRN Meds:.morphine injection, ondansetron, phenol  Assessment/Plan:  Active Problems:  Encounter for long-term (current) use of anticoagulants  SBO (small bowel obstruction)  ARF (acute renal failure)  Dehydration  Nausea & vomiting  Fecal impaction of colon  Warfarin-induced coagulopathy   #1 SBO: Clinically improving. Tolerated solid diet today. Passing flatus, altho no BM yet.  #2 ARF: resolved.  #3 Mechanical Valve: Target INR 2.5-3.5. Back on heparin drip as subtherapeutic, warfarin restarted as well.  #4 Dispo: Plan for DC home in am.    LOS: 5 days   [facility] Pager: [phone] 02/02/2012, 2:27 PM

## 2012-02-03 LAB — BASIC METABOLIC PANEL
CO2: 23 mEq/L (ref 19–32)
Chloride: 109 mEq/L (ref 96–112)
Creatinine, Ser: 1.18 mg/dL (ref 0.50–1.35)
GFR calc Af Amer: 66 mL/min — ABNORMAL LOW (ref >90)
Potassium: 3.8 mEq/L (ref 3.5–5.1)

## 2012-02-03 LAB — CBC
HCT: 31.4 % — ABNORMAL LOW (ref 39.0–52.0)
MCV: 87.5 fL (ref 78.0–100.0)
Platelets: 122 10*3/uL — ABNORMAL LOW (ref 150–400)
RBC: 3.59 MIL/uL — ABNORMAL LOW (ref 4.22–5.81)
RDW: 12.7 % (ref 11.5–15.5)
WBC: 6.2 10*3/uL (ref 4.0–10.5)

## 2012-02-03 LAB — PROTIME-INR: Prothrombin Time: 23.8 seconds — ABNORMAL HIGH (ref 11.6–15.2)

## 2012-02-03 LAB — HEPARIN LEVEL (UNFRACTIONATED)
Heparin Unfractionated: 0.35 IU/mL (ref 0.30–0.70)
Heparin Unfractionated: 0.38 IU/mL (ref 0.30–0.70)

## 2012-02-03 MED ORDER — WARFARIN SODIUM 7.5 MG PO TABS
7.5000 mg | ORAL_TABLET | Freq: Once | ORAL | Status: AC
  Administered 2012-02-03: 7.5 mg via ORAL
  Filled 2012-02-03: qty 1

## 2012-02-03 NOTE — Progress Notes (Signed)
ANTICOAGULATION CONSULT NOTE - Follow Up Consult  Pharmacy Consult for Heparin Indication: St. Jude AVR  Allergies  Allergen Reactions  . Penicillins Anaphylaxis    Patient Measurements: Height: 6\' 2"  (188 cm) Weight: 163 lb 2.3 oz (74 kg) IBW/kg (Calculated) : 82.2  Heparin Dosing Weight:   Vital Signs: Temp: 98.2 F (36.8 C) (04/24 2200) Temp src: Oral (04/24 2200) BP: 158/72 mmHg (04/24 2300) Pulse Rate: 77  (04/24 2200)  Labs:  Alvira Philips 02/03/12 0147 02/02/12 1654 02/02/12 0510 02/01/12 0740 02/01/12 0520 01/31/12 0625  HGB 10.8* -- -- -- 10.6* --  HCT 31.4* -- -- -- 32.4* 33.7*  PLT 122* -- -- -- 114* 121*  APTT -- -- -- -- -- --  LABPROT 23.8* -- 25.0* -- 29.7* --  INR 2.09* -- 2.22* -- 2.77* --  HEPARINUNFRC 0.35 0.17* -- 0.45 -- --  CREATININE 1.18 -- -- -- 1.29 1.40*  CKTOTAL -- -- -- -- -- --  CKMB -- -- -- -- -- --  TROPONINI -- -- -- -- -- --   Estimated Creatinine Clearance: 53.1 ml/min (by C-G formula based on Cr of 1.18).   Medications:  Infusions:    . sodium chloride 100 mL/hr at 02/02/12 1123  . heparin 1,000 Units/hr (02/02/12 0950)    Assessment: Patient with heparin level at goal.  No issues per RN.  Goal of Therapy:  Heparin level 0.3-0.7 units/ml   Plan:  Continue heparin at current rate, follow up with 1000 heparin level  Darlina Guys, Jalaysha Skilton Crowford 02/03/2012,3:10 AM

## 2012-02-03 NOTE — Progress Notes (Signed)
Subjective: No complaints. Passing flatus, no BM. No abdominal pain.  Objective: Vital signs in last 24 hours: Temp:  [97.8 F (36.6 C)-98.7 F (37.1 C)] 97.8 F (36.6 C) (04/25 0500) Pulse Rate:  [66-88] 88  (04/25 0500) Resp:  [18] 18  (04/25 0500) BP: (147-172)/(67-82) 147/82 mmHg (04/25 0500) SpO2:  [97 %-98 %] 97 % (04/25 0500) Weight change:  Last BM Date: 01/28/12  Intake/Output from previous day: 04/24 0701 - 04/25 0700 In: 2135 [P.O.:855; I.V.:1280] Out: 2101 [Urine:2100; Stool:1]     Physical Exam: General: Alert, awake, oriented x3, in no acute distress. HEENT: No bruits, no goiter. Heart: Regular rate and rhythm, without murmurs, rubs, gallops. Lungs: Clear to auscultation bilaterally. Abdomen: Soft, nontender, nondistended, positive bowel sounds. Extremities: No clubbing cyanosis or edema with positive pedal pulses. Neuro: Grossly intact, nonfocal.    Lab Results: Basic Metabolic Panel:  Basename 02/03/12 0147 02/01/12 0520  NA 137 134*  K 3.8 4.0  CL 109 105  CO2 23 24  GLUCOSE 111* 97  BUN 15 23  CREATININE 1.18 1.29  CALCIUM 7.9* 7.9*  MG -- --  PHOS -- --   CBC:  Basename 02/03/12 0147 02/01/12 0520  WBC 6.2 6.9  NEUTROABS -- 4.3  HGB 10.8* 10.6*  HCT 31.4* 32.4*  MCV 87.5 91.3  PLT 122* 114*   Coagulation:  Basename 02/03/12 0147 02/02/12 0510  LABPROT 23.8* 25.0*  INR 2.09* 2.22*    Studies/Results: No results found.  Medications: Scheduled Meds:    . aspirin EC  81 mg Oral Daily  . atorvastatin  10 mg Oral QPC supper  . bimatoprost  1 drop Both Eyes QHS  . brinzolamide  1 drop Both Eyes BID  . cholecalciferol  1,000 Units Oral Daily  . heparin  2,000 Units Intravenous STAT  . metoprolol  25 mg Oral Daily  . pantoprazole (PROTONIX) IV  40 mg Intravenous Q24H  . sodium chloride  3 mL Intravenous Q12H  . tiotropium  18 mcg Inhalation Daily  . warfarin  5 mg Oral ONCE-1800  . warfarin  7.5 mg Oral ONCE-1800  .  Warfarin - Pharmacist Dosing Inpatient   Does not apply q1800  . DISCONTD: heparin  2,000 Units Intravenous Once   Continuous Infusions:    . sodium chloride 100 mL/hr at 02/02/12 1123  . heparin 1,000 Units/hr (02/03/12 1253)   PRN Meds:.morphine injection, ondansetron, phenol  Assessment/Plan:  Active Problems:  Encounter for long-term (current) use of anticoagulants  SBO (small bowel obstruction)  ARF (acute renal failure)  Dehydration  Nausea & vomiting  Fecal impaction of colon  Warfarin-induced coagulopathy   #1 SBO: Clinically improving. Tolerated solid diet today. Passing flatus, altho no BM yet.  #2 ARF: resolved.  #3 Mechanical Valve: Target INR 2.5-3.5. Back on heparin drip as subtherapeutic, warfarin restarted as well.  #4 Dispo: Plan for DC home as soon as INR therapeutic. Patient and wife do not feel competent to administer lovenox at home.    LOS: 6 days   [facility] Pager: [phone] 02/03/2012, 1:17 PM

## 2012-02-03 NOTE — Progress Notes (Signed)
ANTICOAGULATION CONSULT NOTE - Follow Up Consult  Pharmacy Consult for Coumadin/Heparin Indication: St. Jude AVR   Allergies  Allergen Reactions  . Penicillins Anaphylaxis   Patient Measurements: Height: 6\' 2"  (188 cm) Weight: 163 lb 2.3 oz (74 kg) IBW/kg (Calculated) : 82.2   Vital Signs: Temp: 97.8 F (36.6 C) (04/25 0500) Temp src: Oral (04/25 0500) BP: 147/82 mmHg (04/25 0500) Pulse Rate: 88  (04/25 0500)  Labs:  Basename 02/03/12 1050 02/03/12 0147 02/02/12 1654 02/02/12 0510 02/01/12 0520  HGB -- 10.8* -- -- 10.6*  HCT -- 31.4* -- -- 32.4*  PLT -- 122* -- -- 114*  APTT -- -- -- -- --  LABPROT -- 23.8* -- 25.0* 29.7*  INR -- 2.09* -- 2.22* 2.77*  HEPARINUNFRC 0.38 0.35 0.17* -- --  CREATININE -- 1.18 -- -- 1.29  CKTOTAL -- -- -- -- --  CKMB -- -- -- -- --  TROPONINI -- -- -- -- --   Estimated Creatinine Clearance: 53.1 ml/min (by C-G formula based on Cr of 1.18).  Assessment:  78 yo M on chronic warfarin for hx of St. Jude's AVR. Home dose = 5 mg MWF, 7.5 mg other days. Pt NPO and unable to take warfarin on admit. IV heparin started 4/22 when INR 2.66 (MD aware), INR increased to 2.77 on 4/23 and IV heparin held, IV heparin resumed 4/24 and coumadin 5 mg given.   Heparin level therapeutic x 2 on heparin 1000 units/hr  INR subtherapeutic at 2.09.  Plt low but stable, no bleeding/complications reported.  Goal of Therapy:  INR 2.5 - 3.5   Plan:   Continue IV heparin at 1000 units/hr  Coumadin 7.5 mg po x 1 tonight  Heparin level and PT/INR daily  Geoffry Paradise Thi 02/03/2012,12:29 PM

## 2012-02-04 LAB — HEPARIN LEVEL (UNFRACTIONATED): Heparin Unfractionated: 0.29 IU/mL — ABNORMAL LOW (ref 0.30–0.70)

## 2012-02-04 LAB — PROTIME-INR
INR: 2.24 — ABNORMAL HIGH (ref 0.00–1.49)
Prothrombin Time: 25.2 seconds — ABNORMAL HIGH (ref 11.6–15.2)

## 2012-02-04 MED ORDER — ENOXAPARIN (LOVENOX) PATIENT EDUCATION KIT
PACK | Freq: Once | Status: DC
  Filled 2012-02-04: qty 1

## 2012-02-04 MED ORDER — ENOXAPARIN SODIUM 120 MG/0.8ML ~~LOC~~ SOLN
120.0000 mg | Freq: Once | SUBCUTANEOUS | Status: AC
  Administered 2012-02-04: 120 mg via SUBCUTANEOUS
  Filled 2012-02-04: qty 0.8

## 2012-02-04 MED ORDER — ENOXAPARIN SODIUM 120 MG/0.8ML ~~LOC~~ SOLN: 120.0000 mg | Freq: Every day | SUBCUTANEOUS | Status: DC

## 2012-02-04 MED ORDER — WARFARIN SODIUM 7.5 MG PO TABS
7.5000 mg | ORAL_TABLET | Freq: Once | ORAL | Status: DC
  Filled 2012-02-04: qty 1

## 2012-02-04 NOTE — Discharge Summary (Signed)
Physician Discharge Summary  Patient ID: [name] MRN: [id number] DOB/AGE: 76/76/1931 76 y.o.  Admit date: 01/28/2012 Discharge date: 02/04/2012  Primary Care Physician:  [name], MD, MD   Discharge Diagnoses:    Active Problems:  Encounter for long-term (current) use of anticoagulants  SBO (small bowel obstruction)  ARF (acute renal failure)  Dehydration  Nausea & vomiting  Fecal impaction of colon  Warfarin-induced coagulopathy    Medication List  As of 02/04/2012  2:56 PM   TAKE these medications         acetaminophen 500 MG tablet   Commonly known as: TYLENOL   Take 500 mg by mouth every 6 (six) hours as needed. For pain      aspirin EC 81 MG tablet   Take 81 mg by mouth daily.      atorvastatin 10 MG tablet   Commonly known as: LIPITOR   Take 10 mg by mouth daily.      brinzolamide 1 % ophthalmic suspension   Commonly known as: AZOPT   Place 1 drop into both eyes 2 (two) times daily.      cholecalciferol 1000 UNITS tablet   Commonly known as: VITAMIN D   Take 1,000 Units by mouth daily.      enoxaparin 120 MG/0.8ML injection   Commonly known as: LOVENOX   Inject 0.8 mLs (120 mg total) into the skin daily.      LUMIGAN 0.01 % Soln   Generic drug: bimatoprost   Place 1 drop into both eyes At bedtime.      metoprolol 50 MG tablet   Commonly known as: LOPRESSOR   Take 0.5 tablets (25 mg total) by mouth daily.      tiotropium 18 MCG inhalation capsule   Commonly known as: SPIRIVA   Place 18 mcg into inhaler and inhale daily.      warfarin 5 MG tablet   Commonly known as: COUMADIN   Take 5-7.5 mg by mouth daily. Take 1 tablet (5mg ) on mondays,wednesdays and fridays. Take one and one half tablets (7.5mg ) on all other days             Disposition and Follow-up:  Will be discharged home today in stable and improved condition. Will need daily lovenox injections at home until INR at therapeutic range. A home health nurse will be  sent to the house on Sunday and results will be called in to the [facility] coumadin clinic.  Consults:  None    Significant Diagnostic Studies:  Ct Abdomen Pelvis Wo Contrast  01/28/2012  *RADIOLOGY REPORT*  Clinical Data: Abdominal pain, nausea, constipation  CT ABDOMEN AND PELVIS WITHOUT CONTRAST  Technique:  Multidetector CT imaging of the abdomen and pelvis was performed following the standard protocol without intravenous contrast.  Comparison: CT scan of the chest 01/01/2009  Findings: Chronic mild interstitial prominence noted lung bases. Unenhanced liver, spleen and right adrenal gland are unremarkable. Hyperdense left adrenal nodule measures 2.7 by 2 cm stable in size and appearance from prior exam.  Unenhanced kidneys shows no nephrolithiasis.  There is a cyst in upper pole of the left kidney measures 4.6 cm.  Cyst in mid pole posterior aspect of the right kidney measures 4.4 cm.  A cyst in lower pole of the right kidney measures 2.5 cm.  Extensive atherosclerotic calcifications of the abdominal aorta, there is a and the iliac arteries are noted.  Oral contrast material was given to the patient.  Oral contrast material noted within  stomach and proximal small bowel loops. There are distended small bowel loops in mid abdomen with multiple air-fluid levels.  Distal small bowel loops are collapsed with small caliber.  Findings are highly suspicious for small bowel obstruction.  No any contrast material is noted within the distal small bowel or within colon.  Transition point in caliber is noted in the mid abdomen in axial image 49 and coronal image 21.  No pericecal inflammation is noted.  Significant stool noted in the distal sigmoid colon and rectum.  The rectum is distended with stool measures about 6.7 cm suspicious for mild fecal impaction. The left colon is empty collapsed.  No ascites or free air.  No adenopathy.  Prostate gland measures 5 x 3.7 cm.  The urinary bladder is unremarkable.  No  destructive bony lesions are noted within pelvis. Sagittal images of the spine shows diffuse osteopenia. Degenerative changes of the lumbar spine are noted.  Significant disc space flattening noted at L4-L5 level.  Mild disc space flattening noted at L2-L3 level.  Normal appendix is partially visualized in axial image 55.  IMPRESSION:  1.  There are distended proximal small bowel loops with air-fluid levels.  The distal small bowel is smaller caliber.  Transition point in caliber noted in the mid abdomen in axial image 48. Findings are highly suspicious for small bowel obstruction.  No any contrast material noted in distal small bowel. 2.  Stable hyperdense left adrenal nodule. 3.  Bilateral renal cysts are noted.  No hydronephrosis or hydroureter. 4.  Extensive atherosclerotic vascular calcifications. 5.  Normal appendix. 6.  Moderate stool noted in the rectosigmoid colon.  There is distention of the rectum with stools suspicious for distal fecal impaction.  Original Report Authenticated By: Natasha Mead, M.D.   Dg Abd Acute W/chest  01/28/2012  *RADIOLOGY REPORT*  Clinical Data: Vomiting, abdominal pain  ACUTE ABDOMEN SERIES (ABDOMEN 2 VIEW & CHEST 1 VIEW)  Comparison: None.  Findings: Cardiomediastinal silhouette is unremarkable.  The patient is status post median sternotomy and cardiac valve replacement.  Mild hyperinflation.  Probable chronic mild interstitial prominence.  Probable fibrotic changes noted lung bases.  No acute infiltrate or pulmonary edema.  Distended small bowel loops are noted mid and left upper abdomen with air-fluid levels highly suspicious for small bowel obstruction.  No free abdominal air.  IMPRESSION: No acute disease within chest.  Mild hyperinflation.  Probable chronic mild interstitial prominence.  Distended small bowel loops with air-fluid levels are noted upper abdomen suspicious for small bowel obstruction.  No free abdominal air.  Original Report Authenticated By: Natasha Mead, M.D.     Brief H and P: For complete details please refer to admission H and P, but in brief patient is a 78 year old gentleman who states that his last meal was 3 days ago which consisted of biscuits and gravy. The patient states that several hours after having eaten he became nauseous and started having emesis. He describes his emesis as nonbilious non-hematemesis. He has not been able to tolerate any oral intake since then. He denies any abdominal pain and states that he was passing gas up until today. He has not had any diarrhea. He has not had any chest pain, dizziness, shortness of breath, near-syncope, dysuria, frequency or urgency.  Evaluation in the emergency room reveals mildly dehydrated gentleman who was found to have a small bowel obstruction on CT scan. We were asked to admit him for further evaluation and management.     Hospital Course:  Active Problems:  Encounter for long-term (current) use of anticoagulants  SBO (small bowel obstruction)  ARF (acute renal failure)  Dehydration  Nausea & vomiting  Fecal impaction of colon  Warfarin-induced coagulopathy   #1 SBO: resolved conservatively without need for surgical intervention. Is now tolerating solid meals without abdominal pain.  #2 St Jude Mechanical Valve: His discharge has been delayed because his INR was subtherapeutic and he required a heparin drip. Today the son has offered to assist with lovenox administration, so we will proceed with discharge home today. See disposition section for details on arrangement for anticoagulation followp.  Rest of chronic medical issues have been stable this hospitalization and his home medications have not been altered.  Time spent on Discharge: Greater than 30 minutes.  SignedChaya Jan [facility] Pager: [phone] 02/04/2012, 2:56 PM

## 2012-02-04 NOTE — Progress Notes (Signed)
Discontinued Heparin drip at 1540, family left to pick up prescriptions and will return for lovenox injection instruction to be given in an hour

## 2012-02-04 NOTE — Progress Notes (Addendum)
ANTICOAGULATION CONSULT NOTE - Follow Up Consult  Pharmacy Consult for Coumadin/Heparin Indication: St. Jude AVR   Allergies  Allergen Reactions  . Penicillins Anaphylaxis   Patient Measurements: Height: 6\' 2"  (188 cm) Weight: 163 lb 2.3 oz (74 kg) IBW/kg (Calculated) : 82.2   Vital Signs: Temp: 98.2 F (36.8 C) (04/26 0600) Temp src: Oral (04/26 0600) BP: 182/79 mmHg (04/26 0657) Pulse Rate: 93  (04/26 0600)  Labs:  Basename 02/04/12 0505 02/03/12 1050 02/03/12 0147 02/02/12 0510  HGB -- -- 10.8* --  HCT -- -- 31.4* --  PLT -- -- 122* --  APTT -- -- -- --  LABPROT 25.2* -- 23.8* 25.0*  INR 2.24* -- 2.09* 2.22*  HEPARINUNFRC 0.29* 0.38 0.35 --  CREATININE -- -- 1.18 --  CKTOTAL -- -- -- --  CKMB -- -- -- --  TROPONINI -- -- -- --   Estimated Creatinine Clearance: 53.1 ml/min (by C-G formula based on Cr of 1.18).  Assessment:  78 yo M on chronic warfarin for hx of St. Jude's AVR. Home dose = 5 mg MWF, 7.5 mg other days. Pt NPO and unable to take warfarin on admit. IV heparin started 4/22 when INR 2.66 (MD aware), INR increased to 2.77 on 4/23 and IV heparin held, IV heparin resumed 4/24 and coumadin 5 mg given.   Heparin level therapeutic x 2 on heparin 1000 units/hr and slightly low this AM at 0.29.    INR trending back up to 2.24.  Plt low but stable 4/25 - no new CBC today.  No bleeding/complications reported.  MD plans d/c home when INR therapeutic since patient/family do not feel competent to administer Lovenox at home.    Goal of Therapy:  INR 2.5 - 3.5   Plan:   Increase IV heparin to 1100 units/hr  Heparin level at 1400 today (6h after dose increase)  Repeat Coumadin 7.5 mg po x 1 tonight.  Most likely INR will be therapeutic in AM.    Heparin level and PT/INR daily  Geoffry Paradise Thi 02/04/2012,7:14 AM    Addendum:  6 hours heparin level returns low at 0.25 (goal 0.3-0.7) even after increasing the rate to 1100 units/hr.  Patient was  therapeutic x 2 on heparin 1000 units/hr so will not aggressively increase heparin gtt.  Plan:  Increase heparin to 1200 units/hr.  HL in 6 hours.   Geoffry Paradise, PharmD.   Pager:  [phone] 2:50 PM      Addendum:  MD would like to d/c IV heparin and ordered Lovenox 120 mg sq x 1 (1.5 mg/kg/q24h).  RN notified to stop IV heparin and administer Lovenox 1 hour after stopping IV heparin.    Will d/c all labs related IV heparin.  Pharmacy will f/u in AM.  Geoffry Paradise, PharmD.   Pager:  [phone] 2:59 PM

## 2012-02-04 NOTE — Plan of Care (Signed)
Problem: Phase I Progression Outcomes Goal: Initial discharge plan identified Outcome: Completed/Met Date Met:  02/04/12 lovenox training for patient and family

## 2012-02-04 NOTE — Progress Notes (Signed)
Patient's son and spouse are here to view and learn how to administer the lovenox.  The video has been started for all to watch on the tv.

## 2012-02-04 NOTE — Progress Notes (Signed)
Instructed patient's son on how to read syringe and to properly administer the lovenox.  He verbalized the understanding and administered this evenings dose without problems.  Patient tolerated well.  Given the patient and his family discharge instructions and the lovenox kit provided by pharmacy.  Answered questions and gave great support and praise to the son.

## 2012-02-07 ENCOUNTER — Emergency Department (HOSPITAL_COMMUNITY)
Admission: EM | Admit: 2012-02-07 | Discharge: 2012-02-07 | Disposition: A | Discharge status: Home / Self Care | Payer: Medicare Other | Attending: Emergency Medicine | Admitting: Emergency Medicine

## 2012-02-07 ENCOUNTER — Encounter (HOSPITAL_COMMUNITY): Payer: Self-pay | Admitting: *Deleted

## 2012-02-07 DIAGNOSIS — I2510 Atherosclerotic heart disease of native coronary artery without angina pectoris: Secondary | ICD-10-CM | POA: Insufficient documentation

## 2012-02-07 DIAGNOSIS — R600 Localized edema: Secondary | ICD-10-CM

## 2012-02-07 DIAGNOSIS — Z951 Presence of aortocoronary bypass graft: Secondary | ICD-10-CM | POA: Insufficient documentation

## 2012-02-07 DIAGNOSIS — Z7901 Long term (current) use of anticoagulants: Secondary | ICD-10-CM | POA: Insufficient documentation

## 2012-02-07 DIAGNOSIS — R609 Edema, unspecified: Secondary | ICD-10-CM | POA: Insufficient documentation

## 2012-02-07 DIAGNOSIS — J438 Other emphysema: Secondary | ICD-10-CM | POA: Insufficient documentation

## 2012-02-07 DIAGNOSIS — Z79899 Other long term (current) drug therapy: Secondary | ICD-10-CM | POA: Insufficient documentation

## 2012-02-07 DIAGNOSIS — M7989 Other specified soft tissue disorders: Secondary | ICD-10-CM | POA: Insufficient documentation

## 2012-02-07 HISTORY — DX: Unspecified hearing loss, unspecified ear: H91.90

## 2012-02-07 LAB — BASIC METABOLIC PANEL
BUN: 15 mg/dL (ref 6–23)
Calcium: 8 mg/dL — ABNORMAL LOW (ref 8.4–10.5)
Creatinine, Ser: 1.16 mg/dL (ref 0.50–1.35)
GFR calc Af Amer: 67 mL/min — ABNORMAL LOW (ref >90)
GFR calc non Af Amer: 58 mL/min — ABNORMAL LOW (ref >90)
Glucose, Bld: 124 mg/dL — ABNORMAL HIGH (ref 70–99)
Potassium: 3.9 mEq/L (ref 3.5–5.1)

## 2012-02-07 MED ORDER — FUROSEMIDE 20 MG PO TABS: 20.0000 mg | ORAL_TABLET | Freq: Two times a day (BID) | ORAL | Status: DC

## 2012-02-07 NOTE — ED Notes (Signed)
Vascular tech reports pt negative for DVT but positive for hematoma

## 2012-02-07 NOTE — ED Notes (Signed)
MD at bedside. 

## 2012-02-07 NOTE — ED Notes (Signed)
Vascular tech in with pt at this time 

## 2012-02-07 NOTE — ED Notes (Signed)
Spouse states "he just went home Friday, his leg had some swelling but not like this morning, the home health nurse came out and is afraid he may have a DVT"; pt presents with edema to RLE.

## 2012-02-07 NOTE — Progress Notes (Signed)
*  PRELIMINARY RESULTS* Vascular Ultrasound Right Lower Extremity Venous Duplex has been completed.  Preliminary findings: Right= No evidence of DVT. Hematoma vs. Abnormal baker's cyst is seen extending from pop fossa to mid calf.  Farrel Demark, RDMS 02/07/2012, 12:36 PM

## 2012-02-07 NOTE — ED Notes (Signed)
Phlebotomy at bedside.

## 2012-02-07 NOTE — ED Notes (Signed)
Vascular tech at bedside. °

## 2012-02-07 NOTE — ED Provider Notes (Signed)
History     CSN: [id number]  Arrival date & time 02/07/12  1104   First MD Initiated Contact with Patient 02/07/12 1152      Chief Complaint  Patient presents with  . Leg Swelling    (Consider location/radiation/quality/duration/timing/severity/associated sxs/prior treatment) The history is provided by the patient, the spouse and medical records.   The patient is a 78 year old, male, who presents to emergency department with right lower extremity swelling.  He was recently admitted to the hospital for a bowel obstruction.  He was just discharged to home.  His home health nurse evaluated him today and noticed his right lower extremity swelling, so she recommended that they come to the emergency department for evaluation of possible DVT.  The patient denies chest pain, cough, or shortness of breath.  He has not had a fever, or chills. Past Medical History  Diagnosis Date  . CORONARY ATHEROSCLEROSIS NATIVE CORONARY ARTERY 04/23/2009  . DRY EYE SYNDROME 10/09/2008  . COPD 10/09/2008  . CAD, ARTERY BYPASS GRAFT 10/09/2008  . Aortic valve disorder 12/07/2010  . Palpitations   . Emphysema   . HOH (hard of hearing)     Past Surgical History  Procedure Date  . Coronary artery bypass graft   . Aortic valve replacement     Family History  Problem Relation Age of Onset  . Heart attack Father   . Stroke Mother   . Hypertension    . Cancer    . Colon cancer Brother   . Emphysema Brother     History  Substance Use Topics  . Smoking status: Former Smoker -- 2.0 packs/day    Quit date: 10/12/1999  . Smokeless tobacco: Never Used  . Alcohol Use: No      Review of Systems  Constitutional: Negative for fever and chills.  Respiratory: Negative for cough, chest tightness and shortness of breath.   Cardiovascular: Positive for leg swelling. Negative for chest pain.  Gastrointestinal: Negative for nausea and vomiting.  Musculoskeletal:       Right leg swelling  Skin: Negative for  rash.  Neurological: Negative for headaches.  Hematological: Does not bruise/bleed easily.  All other systems reviewed and are negative.    Allergies  Penicillins  Home Medications   Current Outpatient Rx  Name Route Sig Dispense Refill  . ACETAMINOPHEN 500 MG PO TABS Oral Take 500 mg by mouth every 6 (six) hours as needed. For pain    . ASPIRIN EC 81 MG PO TBEC Oral Take 81 mg by mouth daily.    . ATORVASTATIN CALCIUM 10 MG PO TABS Oral Take 10 mg by mouth daily.      Marland Kitchen BRINZOLAMIDE 1 % OP SUSP Both Eyes Place 1 drop into both eyes 2 (two) times daily.     Marland Kitchen VITAMIN D 1000 UNITS PO TABS Oral Take 1,000 Units by mouth daily.    Marland Kitchen ENOXAPARIN SODIUM 120 MG/0.8ML [place] SOLN Subcutaneous Inject 0.8 mLs (120 mg total) into the skin daily. 3 Syringe 0  . LUMIGAN 0.01 % OP SOLN Both Eyes Place 1 drop into both eyes At bedtime.    Marland Kitchen METOPROLOL TARTRATE 50 MG PO TABS Oral Take 25 mg by mouth daily.    Marland Kitchen TIOTROPIUM BROMIDE MONOHYDRATE 18 MCG IN CAPS Inhalation Place 18 mcg into inhaler and inhale daily.      . WARFARIN SODIUM 5 MG PO TABS Oral Take 5-7.5 mg by mouth daily. Take 1 tablet (5mg ) on mondays,wednesdays and fridays. Take one  and one half tablets (7.5mg ) on all other days      BP 142/73  Pulse 81  Temp(Src) 97.6 F (36.4 C) (Oral)  Resp 18  SpO2 97%  Physical Exam  Vitals reviewed. Constitutional: He appears well-developed and well-nourished.  HENT:  Head: Normocephalic and atraumatic.  Eyes: Conjunctivae are normal.  Neck: Normal range of motion. Neck supple.  Cardiovascular: Normal rate.   No murmur heard. Pulmonary/Chest: Effort normal. No respiratory distress. He has no wheezes. He has no rales.  Abdominal: Soft. He exhibits no distension. There is no tenderness.  Musculoskeletal: Normal range of motion. He exhibits edema. He exhibits no tenderness.       Right calf 4+ edema, with no tenderness.  No erythema.  No color change  Skin: Skin is warm and dry.  Psychiatric:  He has a normal mood and affect. Thought content normal.    ED Course  Procedures (including critical care time) Right lower extremity swelling, with recent history of hospitalization.  The patient is currently on Coumadin.  He has no chest pain, or shortness of breath.  There are no signs of infection in his lower extremity.  His wife says that he was ambulatory.  During his hospitalization.  I'm concerned that he may have a DVT.  We will perform a PT, and blood chemistry, and an ultrasound of his lower extremity   Labs Reviewed  BASIC METABOLIC PANEL  PROTIME-INR   No results found.   No diagnosis found.    MDM  Right lower extremity swelling No blood clot. No signs cellulitis. No pain.    supratherapeutic coumadin        Cheri Guppy, MD 02/07/12 1447

## 2012-02-07 NOTE — Discharge Instructions (Signed)
Your ultrasound does not show any signs of a blood clot in your legs.  Use Lasix to reduce the swelling.  Your Coumadin.  Level is too high.  Stop taking Coumadin.  For 2 days, then resume the normal dose.  Followup with your doctor or your home health nurse this Thursday to recheck.  your Coumadin level.  Return for worse or uncontrolled symptoms

## 2012-02-10 ENCOUNTER — Ambulatory Visit: Payer: Self-pay | Admitting: Internal Medicine

## 2012-02-10 DIAGNOSIS — I359 Nonrheumatic aortic valve disorder, unspecified: Secondary | ICD-10-CM

## 2012-02-10 DIAGNOSIS — Z7901 Long term (current) use of anticoagulants: Secondary | ICD-10-CM

## 2012-02-10 DIAGNOSIS — Z952 Presence of prosthetic heart valve: Secondary | ICD-10-CM

## 2012-02-14 ENCOUNTER — Inpatient Hospital Stay (HOSPITAL_COMMUNITY)
Admission: EM | Admit: 2012-02-14 | Discharge: 2012-02-17 | DRG: 315 | Disposition: A | Discharge status: Home Health | Payer: Medicare Other | Attending: Internal Medicine | Admitting: Internal Medicine

## 2012-02-14 ENCOUNTER — Encounter (HOSPITAL_COMMUNITY): Payer: Self-pay

## 2012-02-14 ENCOUNTER — Emergency Department (HOSPITAL_COMMUNITY): Payer: Medicare Other

## 2012-02-14 ENCOUNTER — Inpatient Hospital Stay (HOSPITAL_COMMUNITY): Payer: Medicare Other

## 2012-02-14 DIAGNOSIS — R0600 Dyspnea, unspecified: Secondary | ICD-10-CM

## 2012-02-14 DIAGNOSIS — D689 Coagulation defect, unspecified: Secondary | ICD-10-CM | POA: Diagnosis present

## 2012-02-14 DIAGNOSIS — Z954 Presence of other heart-valve replacement: Secondary | ICD-10-CM

## 2012-02-14 DIAGNOSIS — I359 Nonrheumatic aortic valve disorder, unspecified: Secondary | ICD-10-CM

## 2012-02-14 DIAGNOSIS — T45515A Adverse effect of anticoagulants, initial encounter: Secondary | ICD-10-CM | POA: Diagnosis present

## 2012-02-14 DIAGNOSIS — R791 Abnormal coagulation profile: Secondary | ICD-10-CM

## 2012-02-14 DIAGNOSIS — R0602 Shortness of breath: Secondary | ICD-10-CM

## 2012-02-14 DIAGNOSIS — N179 Acute kidney failure, unspecified: Secondary | ICD-10-CM | POA: Diagnosis present

## 2012-02-14 DIAGNOSIS — M7989 Other specified soft tissue disorders: Secondary | ICD-10-CM

## 2012-02-14 DIAGNOSIS — R269 Unspecified abnormalities of gait and mobility: Secondary | ICD-10-CM | POA: Diagnosis present

## 2012-02-14 DIAGNOSIS — Z7982 Long term (current) use of aspirin: Secondary | ICD-10-CM

## 2012-02-14 DIAGNOSIS — D649 Anemia, unspecified: Secondary | ICD-10-CM

## 2012-02-14 DIAGNOSIS — Z880 Allergy status to penicillin: Secondary | ICD-10-CM

## 2012-02-14 DIAGNOSIS — R58 Hemorrhage, not elsewhere classified: Principal | ICD-10-CM | POA: Diagnosis present

## 2012-02-14 DIAGNOSIS — M79604 Pain in right leg: Secondary | ICD-10-CM | POA: Diagnosis present

## 2012-02-14 DIAGNOSIS — J449 Chronic obstructive pulmonary disease, unspecified: Secondary | ICD-10-CM | POA: Diagnosis present

## 2012-02-14 DIAGNOSIS — Z79899 Other long term (current) drug therapy: Secondary | ICD-10-CM

## 2012-02-14 DIAGNOSIS — R0609 Other forms of dyspnea: Secondary | ICD-10-CM

## 2012-02-14 DIAGNOSIS — S8011XS Contusion of right lower leg, sequela: Secondary | ICD-10-CM | POA: Diagnosis present

## 2012-02-14 DIAGNOSIS — D684 Acquired coagulation factor deficiency: Secondary | ICD-10-CM

## 2012-02-14 DIAGNOSIS — Z23 Encounter for immunization: Secondary | ICD-10-CM

## 2012-02-14 DIAGNOSIS — I10 Essential (primary) hypertension: Secondary | ICD-10-CM | POA: Diagnosis present

## 2012-02-14 DIAGNOSIS — M79609 Pain in unspecified limb: Secondary | ICD-10-CM

## 2012-02-14 DIAGNOSIS — Z7901 Long term (current) use of anticoagulants: Secondary | ICD-10-CM

## 2012-02-14 DIAGNOSIS — J4489 Other specified chronic obstructive pulmonary disease: Secondary | ICD-10-CM | POA: Diagnosis present

## 2012-02-14 DIAGNOSIS — R0989 Other specified symptoms and signs involving the circulatory and respiratory systems: Secondary | ICD-10-CM | POA: Diagnosis present

## 2012-02-14 LAB — CBC
MCH: 29.6 pg (ref 26.0–34.0)
MCV: 89.6 fL (ref 78.0–100.0)
Platelets: 329 10*3/uL (ref 150–400)
RBC: 2.97 MIL/uL — ABNORMAL LOW (ref 4.22–5.81)
RDW: 13.6 % (ref 11.5–15.5)
WBC: 9.1 10*3/uL (ref 4.0–10.5)

## 2012-02-14 LAB — PROTIME-INR
INR: 4.93 — ABNORMAL HIGH (ref 0.00–1.49)
Prothrombin Time: 46.6 seconds — ABNORMAL HIGH (ref 11.6–15.2)

## 2012-02-14 LAB — DIFFERENTIAL
Basophils Absolute: 0.1 10*3/uL (ref 0.0–0.1)
Eosinophils Absolute: 0.1 10*3/uL (ref 0.0–0.7)
Eosinophils Relative: 1 % (ref 0–5)
Lymphs Abs: 1.2 10*3/uL (ref 0.7–4.0)
Neutrophils Relative %: 74 % (ref 43–77)

## 2012-02-14 LAB — BASIC METABOLIC PANEL
Calcium: 8.3 mg/dL — ABNORMAL LOW (ref 8.4–10.5)
GFR calc Af Amer: 50 mL/min — ABNORMAL LOW (ref >90)
GFR calc non Af Amer: 43 mL/min — ABNORMAL LOW (ref >90)
Potassium: 4 mEq/L (ref 3.5–5.1)
Sodium: 136 mEq/L (ref 135–145)

## 2012-02-14 LAB — D-DIMER, QUANTITATIVE: D-Dimer, Quant: 2.04 ug/mL-FEU — ABNORMAL HIGH (ref 0.00–0.48)

## 2012-02-14 LAB — OCCULT BLOOD, POC DEVICE: Fecal Occult Bld: NEGATIVE

## 2012-02-14 MED ORDER — SODIUM CHLORIDE 0.9 % IV SOLN: 250.0000 mL | INTRAVENOUS | Status: DC | PRN

## 2012-02-14 MED ORDER — SODIUM CHLORIDE 0.9 % IJ SOLN
3.0000 mL | Freq: Two times a day (BID) | INTRAMUSCULAR | Status: DC
  Administered 2012-02-14: 3 mL via INTRAVENOUS

## 2012-02-14 MED ORDER — TIOTROPIUM BROMIDE MONOHYDRATE 18 MCG IN CAPS
18.0000 ug | ORAL_CAPSULE | Freq: Every day | RESPIRATORY_TRACT | Status: DC
  Administered 2012-02-15 – 2012-02-16 (×2): 18 ug via RESPIRATORY_TRACT
  Filled 2012-02-14: qty 5

## 2012-02-14 MED ORDER — ATORVASTATIN CALCIUM 10 MG PO TABS
10.0000 mg | ORAL_TABLET | Freq: Every day | ORAL | Status: DC
  Administered 2012-02-14 – 2012-02-17 (×4): 10 mg via ORAL
  Filled 2012-02-14 (×4): qty 1

## 2012-02-14 MED ORDER — VITAMIN D3 25 MCG (1000 UNIT) PO TABS
1000.0000 [IU] | ORAL_TABLET | Freq: Every day | ORAL | Status: DC
Start: 2012-02-14 — End: 2012-02-17
  Administered 2012-02-15 – 2012-02-17 (×3): 1000 [IU] via ORAL
  Filled 2012-02-14 (×4): qty 1

## 2012-02-14 MED ORDER — BIMATOPROST 0.01 % OP SOLN
1.0000 [drp] | Freq: Every day | OPHTHALMIC | Status: DC
  Administered 2012-02-14 – 2012-02-16 (×3): 1 [drp] via OPHTHALMIC
  Filled 2012-02-14 (×2): qty 2.5

## 2012-02-14 MED ORDER — ONDANSETRON HCL 4 MG PO TABS: 4.0000 mg | ORAL_TABLET | Freq: Four times a day (QID) | ORAL | Status: DC | PRN

## 2012-02-14 MED ORDER — HYDROCODONE-ACETAMINOPHEN 5-325 MG PO TABS
1.0000 | ORAL_TABLET | ORAL | Status: DC | PRN
  Administered 2012-02-16: 1 via ORAL
  Filled 2012-02-14: qty 1

## 2012-02-14 MED ORDER — SENNOSIDES-DOCUSATE SODIUM 8.6-50 MG PO TABS
1.0000 | ORAL_TABLET | Freq: Every evening | ORAL | Status: DC | PRN
  Filled 2012-02-14: qty 1

## 2012-02-14 MED ORDER — ONDANSETRON HCL 4 MG/2ML IJ SOLN: 4.0000 mg | Freq: Four times a day (QID) | INTRAMUSCULAR | Status: DC | PRN

## 2012-02-14 MED ORDER — ACETAMINOPHEN 500 MG PO TABS: 500.0000 mg | ORAL_TABLET | Freq: Four times a day (QID) | ORAL | Status: DC | PRN

## 2012-02-14 MED ORDER — ALBUTEROL SULFATE (5 MG/ML) 0.5% IN NEBU: 2.5000 mg | INHALATION_SOLUTION | Freq: Four times a day (QID) | RESPIRATORY_TRACT | Status: DC | PRN

## 2012-02-14 MED ORDER — BRINZOLAMIDE 1 % OP SUSP
1.0000 [drp] | Freq: Two times a day (BID) | OPHTHALMIC | Status: DC
  Administered 2012-02-16 – 2012-02-17 (×3): 1 [drp] via OPHTHALMIC
  Filled 2012-02-14: qty 10

## 2012-02-14 MED ORDER — METOPROLOL TARTRATE 25 MG PO TABS
25.0000 mg | ORAL_TABLET | Freq: Every day | ORAL | Status: DC
  Administered 2012-02-14: 25 mg via ORAL
  Filled 2012-02-14 (×2): qty 1

## 2012-02-14 MED ORDER — SODIUM CHLORIDE 0.9 % IJ SOLN: 3.0000 mL | INTRAMUSCULAR | Status: DC | PRN

## 2012-02-14 MED ORDER — WARFARIN - PHARMACIST DOSING INPATIENT
Freq: Every day | Status: DC
  Administered 2012-02-16: 18:00:00

## 2012-02-14 MED ORDER — WARFARIN - PHARMACIST DOSING INPATIENT: Freq: Every day | Status: DC

## 2012-02-14 MED ORDER — TIOTROPIUM BROMIDE MONOHYDRATE 18 MCG IN CAPS: 18.0000 ug | ORAL_CAPSULE | Freq: Every day | RESPIRATORY_TRACT | Status: DC

## 2012-02-14 MED ORDER — ASPIRIN EC 81 MG PO TBEC
81.0000 mg | DELAYED_RELEASE_TABLET | Freq: Every day | ORAL | Status: DC
  Administered 2012-02-15: 81 mg via ORAL
  Filled 2012-02-14 (×3): qty 1

## 2012-02-14 MED ORDER — SODIUM CHLORIDE 0.9 % IJ SOLN
3.0000 mL | Freq: Two times a day (BID) | INTRAMUSCULAR | Status: DC
  Administered 2012-02-15 – 2012-02-17 (×5): 3 mL via INTRAVENOUS

## 2012-02-14 NOTE — H&P (Signed)
PCP:  [name], MD, MD   DOA:  02/14/2012  1:05 PM  Chief Complaint:  Right leg/foot pain and swelling   HPI: 78 y/o man with hx of mechanical aortic valve on coumadin and other comorbidities as outlined below ,presented to the ER for right leg/ foot swelling and pain with ambulation for the last couple of weeks, he was seen in the ED recently and had venous Doppler sonogram done that showed no DVT . She denies any fall or  leg. He also reported dyspnea with exertion for the last couple of month, he denies any chest pain, cough or palpitation.   We are consulted to admit for further management  Allergies: Allergies  Allergen Reactions  . Penicillins Anaphylaxis    Prior to Admission medications   Medication Sig Start Date End Date Taking? Authorizing Provider  acetaminophen (TYLENOL) 500 MG tablet Take 500 mg by mouth every 6 (six) hours as needed. For pain   Yes Historical Provider, MD  aspirin EC 81 MG tablet Take 81 mg by mouth daily.   Yes Historical Provider, MD  atorvastatin (LIPITOR) 10 MG tablet Take 10 mg by mouth daily.   08/25/11  Yes Historical Provider, MD  brinzolamide (AZOPT) 1 % ophthalmic suspension Place 1 drop into both eyes 2 (two) times daily.    Yes Historical Provider, MD  cholecalciferol (VITAMIN D) 1000 UNITS tablet Take 1,000 Units by mouth daily.   Yes Historical Provider, MD  furosemide (LASIX) 20 MG tablet Take 20 mg by mouth 2 (two) times daily. 02/07/12 02/06/13 Yes Cheri Guppy, MD  LUMIGAN 0.01 % SOLN Place 1 drop into both eyes At bedtime. 12/15/10  Yes Historical Provider, MD  metoprolol (LOPRESSOR) 50 MG tablet Take 25 mg by mouth daily.   Yes Historical Provider, MD  tiotropium (SPIRIVA) 18 MCG inhalation capsule Place 18 mcg into inhaler and inhale daily.     Yes Historical Provider, MD  warfarin (COUMADIN) 5 MG tablet Take 5-7.5 mg by mouth daily. Take 1 tablet (5mg ) on mondays,wednesdays and fridays. Take one and one half tablets (7.5mg )  on all other days 10/11/11  Yes Wendall Stade, MD    Past Medical History  Diagnosis Date  . CORONARY ATHEROSCLEROSIS NATIVE CORONARY ARTERY 04/23/2009  . DRY EYE SYNDROME 10/09/2008  . COPD 10/09/2008  . CAD, ARTERY BYPASS GRAFT 10/09/2008  . Aortic valve disorder 12/07/2010  . Palpitations   . Emphysema   . HOH (hard of hearing)     Past Surgical History  Procedure Date  . Coronary artery bypass graft   . Aortic valve replacement     Social History:  This with his wife reports that he quit smoking about 12 years ago. Used to smoke heavily in the past. He reports that he does not drink alcohol or use illicit drugs.  Family History  Problem Relation Age of Onset  . Heart attack Father   . Stroke Mother   . Hypertension    . Cancer    . Colon cancer Brother   . Emphysema Brother     Review of Systems: As above in history of present illness Constitutional: Denies fever, chills, diaphoresis, appetite change.  HEENT: Denies photophobia, eye pain, redness, hearing loss, ear pain, congestion, sore throat, rhinorrhea, sneezing, mouth sores, trouble swallowing, neck pain, neck stiffness and tinnitus.   Respiratory: Denies  cough, chest tightness,  and wheezing.   Cardiovascular: Denies chest pain, palpitations  Gastrointestinal: Denies nausea, vomiting, abdominal pain, diarrhea, constipation,  blood in stool and abdominal distention.  Genitourinary: Denies dysuria, urgency, frequency, hematuria, flank pain and difficulty urinating.   Neurological: Denies dizziness, seizures, syncope, weakness, light-headedness, numbness and headaches.     Physical Exam:  Filed Vitals:   02/14/12 1540  BP: 140/84  Pulse: 84  Resp: 20  SpO2: 96%    Constitutional: Vital signs reviewed.  Patient is  in no acute distress and cooperative with exam. Alert and oriented x3.  Head: Normocephalic and atraumatic Eyes: PERRL, EOMI, conjunctivae normal, No scleral icterus.  Neck: Supple, Trachea  midline normal ROM, No JVD, mass, thyromegaly, or carotid bruit present.  Cardiovascular: RRR, S1 normal, S2 normal, no MRG, pulses symmetric and intact bilaterally. Positive chronic Pulmonary/Chest: CTAB, no wheezes, rales, or rhonchi Abdominal: Soft. Non-tender, non-distended, bowel sounds are normal, no masses, organomegaly, or guarding present.  Ext: Right leg is swollen from the toes to the knees and posterior thigh, multiple scattered ecchymosis and bruises noted. Pedal pulses intact ,limited m a range of motion . Neurological: A&O x3, Strenght is normal and symmetric bilaterally, cranial nerve II-XII are grossly intact, no focal motor deficit, sensory intact to light touch bilaterally.    Labs on Admission:  Results for orders placed during the hospital encounter of 02/14/12 (from the past 48 hour(s))  CBC     Status: Abnormal   Collection Time   02/14/12  2:05 PM      Component Value Range Comment   WBC 9.1  4.0 - 10.5 (K/uL)    RBC 2.97 (*) 4.22 - 5.81 (MIL/uL)    Hemoglobin 8.8 (*) 13.0 - 17.0 (g/dL)    HCT 40.9 (*) 81.1 - 52.0 (%)    MCV 89.6  78.0 - 100.0 (fL)    MCH 29.6  26.0 - 34.0 (pg)    MCHC 33.1  30.0 - 36.0 (g/dL)    RDW 91.4  78.2 - 95.6 (%)    Platelets 329  150 - 400 (K/uL)   DIFFERENTIAL     Status: Normal   Collection Time   02/14/12  2:05 PM      Component Value Range Comment   Neutrophils Relative 74  43 - 77 (%)    Neutro Abs 6.7  1.7 - 7.7 (K/uL)    Lymphocytes Relative 14  12 - 46 (%)    Lymphs Abs 1.2  0.7 - 4.0 (K/uL)    Monocytes Relative 10  3 - 12 (%)    Monocytes Absolute 0.9  0.1 - 1.0 (K/uL)    Eosinophils Relative 1  0 - 5 (%)    Eosinophils Absolute 0.1  0.0 - 0.7 (K/uL)    Basophils Relative 1  0 - 1 (%)    Basophils Absolute 0.1  0.0 - 0.1 (K/uL)   BASIC METABOLIC PANEL     Status: Abnormal   Collection Time   02/14/12  2:05 PM      Component Value Range Comment   Sodium 136  135 - 145 (mEq/L)    Potassium 4.0  3.5 - 5.1 (mEq/L)     Chloride 101  96 - 112 (mEq/L)    CO2 27  19 - 32 (mEq/L)    Glucose, Bld 119 (*) 70 - 99 (mg/dL)    BUN 23  6 - 23 (mg/dL)    Creatinine, Ser 2.13 (*) 0.50 - 1.35 (mg/dL)    Calcium 8.3 (*) 8.4 - 10.5 (mg/dL)    GFR calc non Af Amer 43 (*) >90 (mL/min)  GFR calc Af Amer 50 (*) >90 (mL/min)   PROTIME-INR     Status: Abnormal   Collection Time   02/14/12  2:05 PM      Component Value Range Comment   Prothrombin Time 46.6 (*) 11.6 - 15.2 (seconds)    INR 4.93 (*) 0.00 - 1.49    OCCULT BLOOD, POC DEVICE     Status: Normal   Collection Time   02/14/12  3:42 PM      Component Value Range Comment   Fecal Occult Bld NEGATIVE       Radiological Exams on Admission: Dg Chest 2 View  02/14/2012  *RADIOLOGY REPORT*  Clinical Data: Rales on clinical exam  CHEST - 2 VIEW  Comparison: 08/10/2011  Findings: Cardiomediastinal silhouette is stable.  Status post median sternotomy again noted.  Stable reticular nodular interstitial prominence and fibrotic changes at lung bases.  No acute infiltrate or pulmonary edema.  IMPRESSION: No active disease.  Stable reticulonodular interstitial prominence and fibrotic changes at lung bases.  Original Report Authenticated By: Natasha Mead, M.D.   Dg Ankle Complete Right  02/14/2012  *RADIOLOGY REPORT*  Clinical Data: Right ankle bruising  RIGHT ANKLE - COMPLETE 3+ VIEW  Comparison: None.  Findings: The ankle mortise intact.  Talar dome is normal.  No malleolar fracture.  No joint effusion.  IMPRESSION: No acute osseous abnormality.  Original Report Authenticated By: Genevive Bi, M.D.   Dg Knee Complete 4 Views Right  02/14/2012  *RADIOLOGY REPORT*  Clinical Data: Right leg swelling  RIGHT KNEE - COMPLETE 4+ VIEW  Comparison: None.  Findings: There is no fracture or dislocation of the right knee. No joint effusion.  There is chondrocalcinosis in the medial and lateral compartment.  IMPRESSION:  1.  No acute findings. 2.  Chondrocalcinosis.  Original Report Authenticated  By: Genevive Bi, M.D.   Dg Foot Complete Right  02/14/2012  *RADIOLOGY REPORT*  Clinical Data: Right foot bruising  RIGHT FOOT COMPLETE - 3+ VIEW  Comparison: none  Findings: No fracture or dislocation of the right foot.  No soft tissue abnormality.  IMPRESSION: No acute osseous abnormality.  Original Report Authenticated By: Genevive Bi, M.D.    Assessment/Plan Principal Problem:  *Right leg pain and swelling Etiology is unclear at this time. Venous Doppler sonogram was negative x2. X-rays negative as above. Pedal pulses are intact. Will obtain CT scan of the thigh and leg to rule out hematoma. -Dr. Arbie Cookey from vascular service was consulted and he will kindly seen in consultation. Active Problems:  DYSPNEA with exertion -Check pro BNP and d-dimer -Check 2-D echocardiogram  Aortic valve disorder status post Mechanical valve  Replacement INR is super therapeutic, we'll ask pharmacy to adjust Coumadin.  ARF (acute renal failure) Possibly secondary to prerenal ischemia, would hold Lasix for now and observe  Warfarin-induced coagulopathy - hold Coumadin and ask pharmacy to dose to Hypertension: Continue outpatient medication and check vitals for orthostasis History of COPD: Continue nebs when necessary CODE STATUS: Full code   Time Spent on Admission: Approximately 45 minutes  Farhan Jean 02/14/2012, 5:07 PM

## 2012-02-14 NOTE — ED Provider Notes (Signed)
This chart was scribed for Gwyneth Sprout, MD by Williemae Natter. The patient was seen in room STRE8/STRE8 at 1:46 PM.  History     CSN: [id number]  Arrival date & time 02/14/12  1305   First MD Initiated Contact with Patient 02/14/12 1331      Chief Complaint  Patient presents with  . Leg Pain    (Consider location/radiation/quality/duration/timing/severity/associated sxs/prior treatment) HPI [name] is a 78 y.o. male who presents to the Emergency Department complaining of pain in right foot and ankle. Pt was seen in ED recently for similar symptoms. Pt has hx of shortness of breath but believes it is unrelated to current symptoms. Family reports that the swelling and discoloration in right foot has worsened in the past week. Pain is worse when walking. Pt was recently diagnosed with a blockage in bowel. No known injury or mechanism. Pt has some pain in back of knee. Pt has had ultrasound done recently.  Past Medical History  Diagnosis Date  . CORONARY ATHEROSCLEROSIS NATIVE CORONARY ARTERY 04/23/2009  . DRY EYE SYNDROME 10/09/2008  . COPD 10/09/2008  . CAD, ARTERY BYPASS GRAFT 10/09/2008  . Aortic valve disorder 12/07/2010  . Palpitations   . Emphysema   . HOH (hard of hearing)     Past Surgical History  Procedure Date  . Coronary artery bypass graft   . Aortic valve replacement     Family History  Problem Relation Age of Onset  . Heart attack Father   . Stroke Mother   . Hypertension    . Cancer    . Colon cancer Brother   . Emphysema Brother     History  Substance Use Topics  . Smoking status: Former Smoker -- 2.0 packs/day    Quit date: 10/12/1999  . Smokeless tobacco: Never Used  . Alcohol Use: No      Review of Systems  Constitutional: Negative for fever and chills.  Respiratory: Negative for shortness of breath.   Gastrointestinal: Negative for nausea and vomiting.  Musculoskeletal: Positive for joint swelling.  Neurological: Negative for  weakness.  All other systems reviewed and are negative.    Allergies  Penicillins  Home Medications   Current Outpatient Rx  Name Route Sig Dispense Refill  . ACETAMINOPHEN 500 MG PO TABS Oral Take 500 mg by mouth every 6 (six) hours as needed. For pain    . ASPIRIN EC 81 MG PO TBEC Oral Take 81 mg by mouth daily.    . ATORVASTATIN CALCIUM 10 MG PO TABS Oral Take 10 mg by mouth daily.      Marland Kitchen BRINZOLAMIDE 1 % OP SUSP Both Eyes Place 1 drop into both eyes 2 (two) times daily.     Marland Kitchen VITAMIN D 1000 UNITS PO TABS Oral Take 1,000 Units by mouth daily.    . FUROSEMIDE 20 MG PO TABS Oral Take 20 mg by mouth 2 (two) times daily.    Marland Kitchen LUMIGAN 0.01 % OP SOLN Both Eyes Place 1 drop into both eyes At bedtime.    Marland Kitchen METOPROLOL TARTRATE 50 MG PO TABS Oral Take 25 mg by mouth daily.    Marland Kitchen TIOTROPIUM BROMIDE MONOHYDRATE 18 MCG IN CAPS Inhalation Place 18 mcg into inhaler and inhale daily.      . WARFARIN SODIUM 5 MG PO TABS Oral Take 5-7.5 mg by mouth daily. Take 1 tablet (5mg ) on mondays,wednesdays and fridays. Take one and one half tablets (7.5mg ) on all other days  There were no vitals taken for this visit.  Physical Exam  Nursing note and vitals reviewed. Constitutional: He is oriented to person, place, and time. He appears well-developed and well-nourished. No distress.  HENT:  Head: Normocephalic and atraumatic.  Eyes: EOM are normal. Pupils are equal, round, and reactive to light.  Neck: Normal range of motion. Neck supple. No tracheal deviation present.  Cardiovascular: Normal rate and intact distal pulses.        normal pt and dp pulses Pt has clicks  Pulmonary/Chest: Effort normal. No respiratory distress. He has rales (mild rales at both bases).  Abdominal: Soft. There is no tenderness. There is no rebound and no guarding.  Musculoskeletal: Normal range of motion. He exhibits edema.       Right ankle: He exhibits swelling and ecchymosis (on right leg from toes to knee and posterior  aspect of thoigh). He exhibits normal pulse.       Pt has pain with flexion on right foot    Neurological: He is alert and oriented to person, place, and time.  Skin: Skin is warm and dry.  Psychiatric: He has a normal mood and affect. His behavior is normal.    ED Course  Procedures (including critical care time)  Labs Reviewed - No data to display No results found.   No diagnosis found.    MDM   Patient with right leg pain and bruising which is only worsened in the last 1-1/2-2 weeks. He states his leg is nontender while he is lying in bed however he gets severe pain when he tries to walk. There is pain with flexion of his right foot but able to arrange everything without pain. He has healing ecchymosis from the knee down worse around the ankle and also extending up the posterior portion of the thigh. He had a DVT study done 4 days ago which was normal and he is on Coumadin with the last level checked today which was 4.5.  Concern that patient may have a broken bone which is causing his pain and swelling complaint. Plain films ordered.  Secondly the patient was hypotensive today with standing with a blood pressure of 60/40. He states that he has been weak since going home from the hospital in short of breath with any exertion. He denies any chest pain but his wife states that it is difficult to help him get around because he is so short of breath at home.  I personally performed the services described in this documentation, which was scribed in my presence.  The recorded information has been reviewed and considered.         Gwyneth Sprout, MD 02/14/12 1429

## 2012-02-14 NOTE — ED Provider Notes (Signed)
Care of the patient assumed on arrival to room. Pt with recent SBO admission has had about 2 weeks of pain and swelling in R lower leg. He has had labile PT/INR since discharge, most recently has been high. He has had Doppler US x 2 neg for DVT. No known injury but did report a brief sharp pain in posterior R thigh prior to onset of swelling. He has also had some DOE recently, but no CP. He has significant bruising and swelling to R lower leg of various stages of healing. He has supratherapeutic INR today and drop in Hgb from 10.8 to 8.8 in the last 2 weeks. He is hemoccult negative. Will admit for further evaluation.   [name] Hoffmeier B. Bernette Mayers, MD 02/14/12 [phone]

## 2012-02-14 NOTE — Progress Notes (Signed)
ANTICOAGULATION CONSULT NOTE - Initial Consult  Pharmacy Consult for Coumadin Indication: Mechanical aortic valve  Allergies  Allergen Reactions  . Penicillins Anaphylaxis    Patient Measurements: Height: 6\' 2"  (188 cm) Weight: 158 lb 11.7 oz (72 kg) IBW/kg (Calculated) : 82.2  Heparin Dosing Weight:   Vital Signs: Temp: 98.5 F (36.9 C) (05/06 1931) Temp src: Oral (05/06 1931) BP: 179/83 mmHg (05/06 1931) Pulse Rate: 84  (05/06 1931)  Labs:  Basename 02/14/12 1405  HGB 8.8*  HCT 26.6*  PLT 329  APTT --  LABPROT 46.6*  INR 4.93*  HEPARINUNFRC --  CREATININE 1.49*  CKTOTAL --  CKMB --  TROPONINI --   Estimated Creatinine Clearance: 40.9 ml/min (by C-G formula based on Cr of 1.49).  Medical History: Past Medical History  Diagnosis Date  . CORONARY ATHEROSCLEROSIS NATIVE CORONARY ARTERY 04/23/2009  . DRY EYE SYNDROME 10/09/2008  . COPD 10/09/2008  . CAD, ARTERY BYPASS GRAFT 10/09/2008  . Aortic valve disorder 12/07/2010  . Palpitations   . Emphysema   . HOH (hard of hearing)     Medications:  Scheduled:    . aspirin EC  81 mg Oral Daily  . atorvastatin  10 mg Oral Daily  . bimatoprost  1 drop Both Eyes QHS  . brinzolamide  1 drop Both Eyes BID  . cholecalciferol  1,000 Units Oral Daily  . metoprolol  25 mg Oral Daily  . sodium chloride  3 mL Intravenous Q12H  . sodium chloride  3 mL Intravenous Q12H  . tiotropium  18 mcg Inhalation Daily  . Warfarin - Pharmacist Dosing Inpatient   Does not apply q1800  . DISCONTD: tiotropium  18 mcg Inhalation Daily  . DISCONTD: Warfarin - Pharmacist Dosing Inpatient   Does not apply q1800    Assessment: 78 yr old male admitted with Rt leg/foot swelling. Doppler shows no DVT. Pt is on coumadin PTA for mechanical aortic valve. His INR is 4.93 today. His home dose of coumadin is 5 mg M,W,F and 7.5 mg the other days.  Goal of Therapy:  INR 2.5-3.5   Plan:  Hold coumadin for INR 4/93. Will follow daily  INR.  Eugene Garnet 02/14/2012,8:36 PM

## 2012-02-14 NOTE — Consult Note (Signed)
The patient presents emergency room with persistent swelling and bruising in his right leg from the knee distally. He was seen one week ago in the emergency department with abdominal pain and a partial small bowel fraction. He does have a history of aortic valve replacement and is on chronic Coumadin therapy. At that time he was found to be supratherapeutic with a 9 are greater than 5. He held his Coumadin for several days and then reinstituted this. He presents to the emergency department today with continued difficulty with the right leg pain from his knee distally. This is mostly in his calf and is more so with walking. He does not have any significant pain but does have tenderness to palpation over his calf and also has some pain with the dorsiflexion of his foot. He has no history of DVT and has had a negative venous duplex. He does not have any tissue loss and no rest pain in his right foot. He reports this began rather suddenly and family are with him reports of a person her swelling after his discharge from the hospital with partial small bowel traction. He did have a CT scan one week ago but this did not extend past the level of the proximal thigh. There is no evidence of hematoma on his prior CT scan by my examination.  Past Medical History  Diagnosis Date  . CORONARY ATHEROSCLEROSIS NATIVE CORONARY ARTERY 04/23/2009  . DRY EYE SYNDROME 10/09/2008  . COPD 10/09/2008  . CAD, ARTERY BYPASS GRAFT 10/09/2008  . Aortic valve disorder 12/07/2010  . Palpitations   . Emphysema   . HOH (hard of hearing)     History  Substance Use Topics  . Smoking status: Former Smoker -- 2.0 packs/day    Quit date: 10/12/1999  . Smokeless tobacco: Never Used  . Alcohol Use: No    Family History  Problem Relation Age of Onset  . Heart attack Father   . Stroke Mother   . Hypertension    . Cancer    . Colon cancer Brother   . Emphysema Brother     Allergies  Allergen Reactions  . Penicillins  Anaphylaxis    No current facility-administered medications for this encounter. Current outpatient prescriptions:acetaminophen (TYLENOL) 500 MG tablet, Take 500 mg by mouth every 6 (six) hours as needed. For pain, Disp: , Rfl: ;  aspirin EC 81 MG tablet, Take 81 mg by mouth daily., Disp: , Rfl: ;  atorvastatin (LIPITOR) 10 MG tablet, Take 10 mg by mouth daily.  , Disp: , Rfl: ;  brinzolamide (AZOPT) 1 % ophthalmic suspension, Place 1 drop into both eyes 2 (two) times daily. , Disp: , Rfl:  cholecalciferol (VITAMIN D) 1000 UNITS tablet, Take 1,000 Units by mouth daily., Disp: , Rfl: ;  furosemide (LASIX) 20 MG tablet, Take 20 mg by mouth 2 (two) times daily., Disp: , Rfl: ;  LUMIGAN 0.01 % SOLN, Place 1 drop into both eyes At bedtime., Disp: , Rfl: ;  metoprolol (LOPRESSOR) 50 MG tablet, Take 25 mg by mouth daily., Disp: , Rfl:  tiotropium (SPIRIVA) 18 MCG inhalation capsule, Place 18 mcg into inhaler and inhale daily.  , Disp: , Rfl: ;  warfarin (COUMADIN) 5 MG tablet, Take 5-7.5 mg by mouth daily. Take 1 tablet (5mg ) on mondays,wednesdays and fridays. Take one and one half tablets (7.5mg ) on all other days, Disp: , Rfl: ;  DISCONTD: furosemide (LASIX) 20 MG tablet, Take 1 tablet (20 mg total) by mouth 2 (two)  times daily., Disp: 30 tablet, Rfl: 0  BP 136/62  Pulse 79  Resp 20  SpO2 95%  There is no height or weight on file to calculate BMI.       Physical exam: Well developed well-nourished white male in no acute distress lying on the ER stretcher. Abdomen soft nontender no masses noted Extremities warm bilaterally. He does have a chronic allergies suggesting chronic bruising from his knee distally on the right. His motor and sensory function is intact bilaterally. He does have swelling of his right leg greater than his left. He does have palpable dorsalis pedis pulse bilaterally and does have a 2+ popliteal pulse on the right with no evidence of popliteal artery aneurysm. Neurologic is  grossly intact  Venous duplex: No evidence of DVT  Impression and plan no evidence of DVT by venous duplex and palpable dorsalis pedis pulse with no evidence of arterial insufficiency. He is set continues to be supratherapeutic with a 9 are continue to be of 5. I suspect spontaneous bleed into his right calf and is resolving this. He does not have any evidence of ongoing bleeding and this is stabilized. He is scheduled to have a CT scan of his leg and a agree with this plan to confirm a hematoma. I would correct his supratherapeutic anticoagulation if this does confirm a hematoma. There is no evidence for need for surgical intervention regarding this. I explained to the patient's family that he will spontaneously resolve this hematoma this is indeed the case. There is no evidence of any vascular issue. We are available to help as needed

## 2012-02-14 NOTE — ED Notes (Signed)
EMS reports patient with c/o right leg for 3 weeks, HHN observed SOB with exertion, No SOB with EMS

## 2012-02-15 ENCOUNTER — Inpatient Hospital Stay (HOSPITAL_COMMUNITY): Payer: Medicare Other

## 2012-02-15 DIAGNOSIS — M79609 Pain in unspecified limb: Secondary | ICD-10-CM

## 2012-02-15 DIAGNOSIS — I359 Nonrheumatic aortic valve disorder, unspecified: Secondary | ICD-10-CM

## 2012-02-15 DIAGNOSIS — R0602 Shortness of breath: Secondary | ICD-10-CM

## 2012-02-15 DIAGNOSIS — Z954 Presence of other heart-valve replacement: Secondary | ICD-10-CM

## 2012-02-15 DIAGNOSIS — D684 Acquired coagulation factor deficiency: Secondary | ICD-10-CM

## 2012-02-15 LAB — FERRITIN: Ferritin: 374 ng/mL — ABNORMAL HIGH (ref 22–322)

## 2012-02-15 LAB — COMPREHENSIVE METABOLIC PANEL
ALT: 11 U/L (ref 0–53)
AST: 12 U/L (ref 0–37)
Albumin: 2.2 g/dL — ABNORMAL LOW (ref 3.5–5.2)
Calcium: 8.3 mg/dL — ABNORMAL LOW (ref 8.4–10.5)
GFR calc Af Amer: 54 mL/min — ABNORMAL LOW (ref >90)
Sodium: 138 mEq/L (ref 135–145)
Total Protein: 5.7 g/dL — ABNORMAL LOW (ref 6.0–8.3)

## 2012-02-15 LAB — IRON AND TIBC
Iron: 37 ug/dL — ABNORMAL LOW (ref 42–135)
TIBC: 212 ug/dL — ABNORMAL LOW (ref 215–435)
UIBC: 175 ug/dL (ref 125–400)

## 2012-02-15 LAB — PRO B NATRIURETIC PEPTIDE: Pro B Natriuretic peptide (BNP): 526.5 pg/mL — ABNORMAL HIGH (ref 0–450)

## 2012-02-15 MED ORDER — METOPROLOL TARTRATE 25 MG PO TABS
25.0000 mg | ORAL_TABLET | Freq: Every day | ORAL | Status: DC
  Administered 2012-02-15 – 2012-02-17 (×3): 25 mg via ORAL
  Filled 2012-02-15 (×3): qty 1

## 2012-02-15 MED ORDER — TECHNETIUM TO 99M ALBUMIN AGGREGATED
3.0000 | Freq: Once | INTRAVENOUS | Status: AC | PRN
  Administered 2012-02-15: 3 via INTRAVENOUS

## 2012-02-15 MED ORDER — POLYSACCHARIDE IRON COMPLEX 150 MG PO CAPS
150.0000 mg | ORAL_CAPSULE | Freq: Every day | ORAL | Status: DC
  Administered 2012-02-15 – 2012-02-17 (×3): 150 mg via ORAL
  Filled 2012-02-15 (×3): qty 1

## 2012-02-15 MED ORDER — XENON XE 133 GAS
20.0000 | GAS_FOR_INHALATION | Freq: Once | RESPIRATORY_TRACT | Status: AC | PRN
  Administered 2012-02-15: 20 via RESPIRATORY_TRACT

## 2012-02-15 NOTE — Progress Notes (Signed)
ANTICOAGULATION CONSULT NOTE - Follow Up Consult  Pharmacy Consult for warfarin Indication: mechanical aortic valve  Allergies  Allergen Reactions  . Penicillins Anaphylaxis   Vital Signs: Temp: 97.7 F (36.5 C) (05/07 0528) Temp src: Oral (05/07 0528) BP: 116/63 mmHg (05/07 0528) Pulse Rate: 69  (05/07 0528)  Labs:  Basename 02/15/12 0502 02/14/12 1405  HGB -- 8.8*  HCT -- 26.6*  PLT -- 329  APTT -- --  LABPROT 45.9* 46.6*  INR 4.84* 4.93*  HEPARINUNFRC -- --  CREATININE 1.39* 1.49*  CKTOTAL -- --  CKMB -- --  TROPONINI -- --    Estimated Creatinine Clearance: 43.9 ml/min (by C-G formula based on Cr of 1.39).   Medications:  Warfarin 5mg  MWF and 7.5 mg T/TR/Sat/Sun  Assessment: 78 yr old male admitted with Rt leg/foot swelling. Doppler shows no DVT. Pt is on coumadin PTA for mechanical aortic valve. His INR is 4.8 today, no significant decrease from yesterday. No bleeding complications have been noted, no role for vitamin k at this time given valve. Will continue to hold warfarin and try to limit days with no coumadin given to avoid sudden drops in INR.  Goal of Therapy:  INR goal 2.5-3.5   Plan:  Hold warfarin again today, restart dosing once INR trends downward  [name] 02/15/2012,10:14 AM

## 2012-02-15 NOTE — Progress Notes (Addendum)
Subjective: Patient seen and examined,denies any complaints.  Objective: Vital signs in last 24 hours: Temp:  [97.7 F (36.5 C)-98.5 F (36.9 C)] 97.7 F (36.5 C) (05/07 0528) Pulse Rate:  [69-84] 69  (05/07 0528) Resp:  [17-20] 17  (05/07 0528) BP: (116-179)/(62-84) 116/63 mmHg (05/07 0528) SpO2:  [93 %-98 %] 93 % (05/07 0528) Weight:  [72 kg (158 lb 11.7 oz)] 72 kg (158 lb 11.7 oz) (05/06 1931) Weight change:  Last BM Date: 02/14/12  Intake/Output from previous day: 05/06 0701 - 05/07 0700 In: 243 [P.O.:240; I.V.:3] Out: 125 [Urine:125]     Physical Exam: General: Alert, awake, oriented x3, in no acute distress.hard of hearing Heart: Regular rate and rhythm, without murmurs, rubs, gallops.click at the aortic area Lungs: Clear to auscultation bilaterally. Abdomen: Soft, nontender, nondistended, positive bowel sounds. Extremities: Right leg is swollen from the toes to the knees and posterior thigh, multiple scattered ecchymosis and bruises noted. Pedal pulses intact ,limited range of motion . Neuro: Grossly intact, nonfocal.    Lab Results: Results for orders placed during the hospital encounter of 02/14/12 (from the past 24 hour(s))  CBC     Status: Abnormal   Collection Time   02/14/12  2:05 PM      Component Value Range   WBC 9.1  4.0 - 10.5 (K/uL)   RBC 2.97 (*) 4.22 - 5.81 (MIL/uL)   Hemoglobin 8.8 (*) 13.0 - 17.0 (g/dL)   HCT 98.1 (*) 19.1 - 52.0 (%)   MCV 89.6  78.0 - 100.0 (fL)   MCH 29.6  26.0 - 34.0 (pg)   MCHC 33.1  30.0 - 36.0 (g/dL)   RDW 47.8  29.5 - 62.1 (%)   Platelets 329  150 - 400 (K/uL)  DIFFERENTIAL     Status: Normal   Collection Time   02/14/12  2:05 PM      Component Value Range   Neutrophils Relative 74  43 - 77 (%)   Neutro Abs 6.7  1.7 - 7.7 (K/uL)   Lymphocytes Relative 14  12 - 46 (%)   Lymphs Abs 1.2  0.7 - 4.0 (K/uL)   Monocytes Relative 10  3 - 12 (%)   Monocytes Absolute 0.9  0.1 - 1.0 (K/uL)   Eosinophils Relative 1  0 - 5 (%)     Eosinophils Absolute 0.1  0.0 - 0.7 (K/uL)   Basophils Relative 1  0 - 1 (%)   Basophils Absolute 0.1  0.0 - 0.1 (K/uL)  BASIC METABOLIC PANEL     Status: Abnormal   Collection Time   02/14/12  2:05 PM      Component Value Range   Sodium 136  135 - 145 (mEq/L)   Potassium 4.0  3.5 - 5.1 (mEq/L)   Chloride 101  96 - 112 (mEq/L)   CO2 27  19 - 32 (mEq/L)   Glucose, Bld 119 (*) 70 - 99 (mg/dL)   BUN 23  6 - 23 (mg/dL)   Creatinine, Ser 3.08 (*) 0.50 - 1.35 (mg/dL)   Calcium 8.3 (*) 8.4 - 10.5 (mg/dL)   GFR calc non Af Amer 43 (*) >90 (mL/min)   GFR calc Af Amer 50 (*) >90 (mL/min)  PROTIME-INR     Status: Abnormal   Collection Time   02/14/12  2:05 PM      Component Value Range   Prothrombin Time 46.6 (*) 11.6 - 15.2 (seconds)   INR 4.93 (*) 0.00 - 1.49   OCCULT BLOOD,  POC DEVICE     Status: Normal   Collection Time   02/14/12  3:42 PM      Component Value Range   Fecal Occult Bld NEGATIVE    VITAMIN B12     Status: Normal   Collection Time   02/14/12  7:54 PM      Component Value Range   Vitamin B-12 286  211 - 911 (pg/mL)  FOLATE     Status: Normal   Collection Time   02/14/12  7:54 PM      Component Value Range   Folate 6.5    IRON AND TIBC     Status: Abnormal   Collection Time   02/14/12  7:54 PM      Component Value Range   Iron 37 (*) 42 - 135 (ug/dL)   TIBC 454 (*) 098 - 119 (ug/dL)   Saturation Ratios 17 (*) 20 - 55 (%)   UIBC 175  125 - 400 (ug/dL)  FERRITIN     Status: Abnormal   Collection Time   02/14/12  7:54 PM      Component Value Range   Ferritin 374 (*) 22 - 322 (ng/mL)  RETICULOCYTES     Status: Abnormal   Collection Time   02/14/12  7:54 PM      Component Value Range   Retic Ct Pct 2.5  0.4 - 3.1 (%)   RBC. 3.09 (*) 4.22 - 5.81 (MIL/uL)   Retic Count, Manual 77.3  19.0 - 186.0 (K/uL)  D-DIMER, QUANTITATIVE     Status: Abnormal   Collection Time   02/14/12  7:54 PM      Component Value Range   D-Dimer, Quant 2.04 (*) 0.00 - 0.48 (ug/mL-FEU)   COMPREHENSIVE METABOLIC PANEL     Status: Abnormal   Collection Time   02/15/12  5:02 AM      Component Value Range   Sodium 138  135 - 145 (mEq/L)   Potassium 4.2  3.5 - 5.1 (mEq/L)   Chloride 104  96 - 112 (mEq/L)   CO2 29  19 - 32 (mEq/L)   Glucose, Bld 103 (*) 70 - 99 (mg/dL)   BUN 24 (*) 6 - 23 (mg/dL)   Creatinine, Ser 1.47 (*) 0.50 - 1.35 (mg/dL)   Calcium 8.3 (*) 8.4 - 10.5 (mg/dL)   Total Protein 5.7 (*) 6.0 - 8.3 (g/dL)   Albumin 2.2 (*) 3.5 - 5.2 (g/dL)   AST 12  0 - 37 (U/L)   ALT 11  0 - 53 (U/L)   Alkaline Phosphatase 117  39 - 117 (U/L)   Total Bilirubin 0.6  0.3 - 1.2 (mg/dL)   GFR calc non Af Amer 47 (*) >90 (mL/min)   GFR calc Af Amer 54 (*) >90 (mL/min)  PROTIME-INR     Status: Abnormal   Collection Time   02/15/12  5:02 AM      Component Value Range   Prothrombin Time 45.9 (*) 11.6 - 15.2 (seconds)   INR 4.84 (*) 0.00 - 1.49   PRO B NATRIURETIC PEPTIDE     Status: Abnormal   Collection Time   02/15/12  5:02 AM      Component Value Range   Pro B Natriuretic peptide (BNP) 526.5 (*) 0 - 450 (pg/mL)    Studies/Results:  Ct Tibia Fibula Right Wo Contrast  02/14/2012  *RADIOLOGY REPORT*  Clinical Data: Hematoma.  Right leg swelling.  CT OF THE RIGHT TIBIA FIBULA WITHOUT CONTRAST  Comparison:  None.  Findings: Tricompartmental right knee osteoarthritis is present with moderate effusion.  Chondrocalcinosis is also present.  There is no fracture. Tibia and fibula appear intact.  Small vessel atherosclerosis is present below the knee.  Diffuse infiltration of subcutaneous tissues is present which probably represents edema or cellulitis in the appropriate clinical setting.  Varices are present in the medial leg.  The soft tissues demonstrate a large Baker's cyst dissecting inferiorly with heterogeneous material. This probably represents hemorrhage into a Baker's cyst with either subsequent dissection along the deep surface of the medial head of the gastrocnemius or  intramuscular hematoma in the medial posterior muscular compartment.  The flexor and extensor tendons appear intact.  There is no gas in the soft tissues.  Hematoma measures 18 cm long axis.  Widest axial dimensions are 64 mm transverse by 39 mm AP.  IMPRESSION: Hematoma of the leg extending from Baker's cyst inferiorly in the posterior muscular compartment measuring at least 18 cm x 6.4 cm x 3.9 cm.  Original Report Authenticated By: Andreas Newport, M.D.      Medications:    . aspirin EC  81 mg Oral Daily  . atorvastatin  10 mg Oral Daily  . bimatoprost  1 drop Both Eyes QHS  . brinzolamide  1 drop Both Eyes BID  . cholecalciferol  1,000 Units Oral Daily  . metoprolol  25 mg Oral Daily  . sodium chloride  3 mL Intravenous Q12H  . tiotropium  18 mcg Inhalation Daily  . Warfarin - Pharmacist Dosing Inpatient   Does not apply q1800  . DISCONTD: sodium chloride  3 mL Intravenous Q12H  . DISCONTD: tiotropium  18 mcg Inhalation Daily  . DISCONTD: Warfarin - Pharmacist Dosing Inpatient   Does not apply q1800    sodium chloride, acetaminophen, albuterol, HYDROcodone-acetaminophen, ondansetron (ZOFRAN) IV, ondansetron, senna-docusate, sodium chloride     Assessment/Plan:  Principal Problem:  *Right leg hematoma: CT leg as above ,pedal pulses intact .appreciated Dr Bosie Helper input ,hematoma will likley spontaneously resolve .  As per family size is already decreasing ,  continue to monitor. Pain control Active Problems:  DYSPNEA; Elevated D dimer noted ,will obtain V/Q scan ,patient is already on coumadin ,If PE found will need IVC flter. - 2-D echocardiogram pending .pro BNP is not impressive ,CXR showed no pulmonary edema.lasix on hold for now . Aortic valve disorder status post Mechanical valve Replacement  INR is supra therapeutic, pharmacy to adjust Coumadin,will not reverse..  ARF (acute renal failure) slowly improving  Possibly secondary to prerenal ischemia, would continue to   hold Lasix for now and observe  Warfarin-induced coagulopathy  - hold Coumadin and ask pharmacy to dose  Acute on chronic anemia: acute component mostlikley secondary to hematoma .Feobt neg x 1 .start po iron Hypertension: Continue Metoprolol with parameters and check vitals for orthostasis  History of COPD: Continue nebs when necessary  CODE STATUS: Full code Difficulty ambulating: PT consult      LOS: 1 day   Stefanos Haynesworth 02/15/2012, 7:18 AM

## 2012-02-15 NOTE — Progress Notes (Signed)
  Echocardiogram 2D Echocardiogram has been performed.  Jorje Guild [facility] 02/15/2012, 11:13 AM

## 2012-02-15 NOTE — Care Management Note (Addendum)
    Page 1 of 2   02/17/2012     10:17:24 AM   CARE MANAGEMENT NOTE 02/17/2012  Patient:  [name], [name]   Account Number:  [id number]  Date Initiated:  02/15/2012  Documentation initiated by:  Onnie Boer  Subjective/Objective Assessment:   PT WAS ADMITTED WITH A LEG HEMATOMA     Action/Plan:   PROGRESSION OF CARE AND DISCHARGE PLANNING   Anticipated DC Date:  02/18/2012   Anticipated DC Plan:  HOME W HOME HEALTH SERVICES      DC Planning Services  CM consult      [facility] Choice  HOME HEALTH   Choice offered to / List presented to:  C-1 Patient        HH arranged  HH-1 RN  HH-2 PT  HH-4 NURSE'S AIDE      HH agency  [facility]   Status of service:  Completed, signed off Medicare Important Message given?   (If response is "NO", the following Medicare IM given date fields will be blank) Date Medicare IM given:   Date Additional Medicare IM given:    Discharge Disposition:  HOME W HOME HEALTH SERVICES  Per UR Regulation:  Reviewed for med. necessity/level of care/duration of stay  If discussed at Long Length of Stay Meetings, dates discussed:    Comments:  02/17/12 1013  Aylla Huffine SIMMONS RN, BSN [phone] RECEIVED ORDER FOR HHRN/PT/NA; MET WITH PT AT BEDSIDE TO DISCUSS HH SERVICES; PT IS ACTIVE WITH [facility] AND IS SATISFIED WITH SERVICES; REFERRAL PLACED TO [facility] WITH [facility] FOR ACTIVE/ NEW ORDERS; HAS DME- RW, CANE. LIVES IN [place]   02/15/12 Onnie Boer, RN, BSN [id number] PTA PT WAS AT HOME WITH HIS WIFE.  WILL F/U ON DC NEEDS.

## 2012-02-15 NOTE — Progress Notes (Signed)
Utilization review completed. Rayola Everhart RN, CCM  

## 2012-02-16 DIAGNOSIS — S8011XS Contusion of right lower leg, sequela: Secondary | ICD-10-CM | POA: Diagnosis present

## 2012-02-16 DIAGNOSIS — D684 Acquired coagulation factor deficiency: Secondary | ICD-10-CM

## 2012-02-16 DIAGNOSIS — Z954 Presence of other heart-valve replacement: Secondary | ICD-10-CM

## 2012-02-16 DIAGNOSIS — M7981 Nontraumatic hematoma of soft tissue: Secondary | ICD-10-CM

## 2012-02-16 DIAGNOSIS — R0602 Shortness of breath: Secondary | ICD-10-CM

## 2012-02-16 LAB — BASIC METABOLIC PANEL
CO2: 27 mEq/L (ref 19–32)
Calcium: 8.4 mg/dL (ref 8.4–10.5)
Creatinine, Ser: 1.36 mg/dL — ABNORMAL HIGH (ref 0.50–1.35)
GFR calc non Af Amer: 48 mL/min — ABNORMAL LOW (ref >90)
Glucose, Bld: 98 mg/dL (ref 70–99)
Sodium: 135 mEq/L (ref 135–145)

## 2012-02-16 LAB — CBC
MCH: 30.2 pg (ref 26.0–34.0)
MCHC: 32.9 g/dL (ref 30.0–36.0)
MCV: 91.7 fL (ref 78.0–100.0)
Platelets: 274 10*3/uL (ref 150–400)
RBC: 2.78 MIL/uL — ABNORMAL LOW (ref 4.22–5.81)

## 2012-02-16 LAB — PROTIME-INR: Prothrombin Time: 32.9 seconds — ABNORMAL HIGH (ref 11.6–15.2)

## 2012-02-16 MED ORDER — WARFARIN SODIUM 5 MG PO TABS
5.0000 mg | ORAL_TABLET | Freq: Once | ORAL | Status: AC
  Administered 2012-02-16: 5 mg via ORAL
  Filled 2012-02-16: qty 1

## 2012-02-16 NOTE — Progress Notes (Signed)
[name] is a 78 y.o. male admitted with right leg swelling, found to have a hematoma in setting of supra therapeutic INR. I have reviewed his medical record, seen and examined him at bed side. He is hard of hearing, hence limited conversation. Appreciate VVS/pharmacy input.  SUBJECTIVE "ok".   1. Supratherapeutic INR   2. Anemia   3. Dyspnea   4. Aortic valve disorder     Past Medical History  Diagnosis Date  . CORONARY ATHEROSCLEROSIS NATIVE CORONARY ARTERY 04/23/2009  . DRY EYE SYNDROME 10/09/2008  . COPD 10/09/2008  . CAD, ARTERY BYPASS GRAFT 10/09/2008  . Aortic valve disorder 12/07/2010  . Palpitations   . Emphysema   . HOH (hard of hearing)    Current Facility-Administered Medications  Medication Dose Route Frequency Provider Last Rate Last Dose  . 0.9 %  sodium chloride infusion  250 mL Intravenous PRN Antonieta Pert, MD      . acetaminophen (TYLENOL) tablet 500 mg  500 mg Oral Q6H PRN Antonieta Pert, MD      . albuterol (PROVENTIL) (5 MG/ML) 0.5% nebulizer solution 2.5 mg  2.5 mg Nebulization Q6H PRN Antonieta Pert, MD      . atorvastatin (LIPITOR) tablet 10 mg  10 mg Oral Daily Antonieta Pert, MD   10 mg at 02/15/12 1125  . bimatoprost (LUMIGAN) 0.01 % ophthalmic solution 1 drop  1 drop Both Eyes QHS Antonieta Pert, MD   1 drop at 02/15/12 2103  . brinzolamide (AZOPT) 1 % ophthalmic suspension 1 drop  1 drop Both Eyes BID Antonieta Pert, MD      . cholecalciferol (VITAMIN D) tablet 1,000 Units  1,000 Units Oral Daily Antonieta Pert, MD   1,000 Units at 02/15/12 1125  . HYDROcodone-acetaminophen (NORCO) 5-325 MG per tablet 1-2 tablet  1-2 tablet Oral Q4H PRN Antonieta Pert, MD      . iron polysaccharides (NIFEREX) capsule 150 mg  150 mg Oral Daily Antonieta Pert, MD   150 mg at 02/15/12 1124  . metoprolol tartrate (LOPRESSOR) tablet 25 mg  25 mg Oral Daily Antonieta Pert, MD   25 mg at 02/15/12 1124  . ondansetron (ZOFRAN) tablet 4 mg  4 mg Oral  Q6H PRN Antonieta Pert, MD       Or  . ondansetron [facility]) injection 4 mg  4 mg Intravenous Q6H PRN Antonieta Pert, MD      . senna-docusate (Senokot-S) tablet 1 tablet  1 tablet Oral QHS PRN Antonieta Pert, MD      . sodium chloride 0.9 % injection 3 mL  3 mL Intravenous Q12H Antonieta Pert, MD   3 mL at 02/15/12 2103  . sodium chloride 0.9 % injection 3 mL  3 mL Intravenous PRN Antonieta Pert, MD      . technetium albumin aggregated (MAA) injection solution 3 milli Curie  3 milli Curie Intravenous Once PRN Medication Radiologist, MD   3 milli Curie at 02/15/12 1041  . tiotropium (SPIRIVA) inhalation capsule 18 mcg  18 mcg Inhalation Daily Antonieta Pert, MD   18 mcg at 02/16/12 0741  . Warfarin - Pharmacist Dosing Inpatient   Does not apply [id number] Antonieta Pert, MD      . xenon xe 133 gas 20 milli Curie  20 milli Curie Inhalation Once PRN Medication Radiologist, MD   20 milli Curie at 02/15/12 1040  . DISCONTD: aspirin EC tablet 81  mg  81 mg Oral Daily Antonieta Pert, MD   81 mg at 02/15/12 1125   Allergies  Allergen Reactions  . Penicillins Anaphylaxis   Principal Problem:  *Right leg pain Active Problems:  DYSPNEA  Aortic valve disorder  ARF (acute renal failure)  Warfarin-induced coagulopathy   [name] signs in last 24 hours: Temp:  [97.7 F (36.5 C)-98.7 F (37.1 C)] 97.7 F (36.5 C) (05/08 0509) Pulse Rate:  [60-69] 69  (05/08 0509) Resp:  [17-18] 17  (05/08 0509) BP: (100-127)/(54-65) 127/63 mmHg (05/08 0509) SpO2:  [94 %-96 %] 96 % (05/08 0509) Weight change:  Last BM Date: 02/14/12  Intake/Output from previous day: 05/07 0701 - 05/08 0700 In: 483 [P.O.:480; I.V.:3] Out: 400 [Urine:400] Intake/Output this shift:    Lab Results:  Basename 02/16/12 0556 02/14/12 1405  WBC 6.8 9.1  HGB 8.4* 8.8*  HCT 25.5* 26.6*  PLT 274 329   BMET  Basename 02/16/12 0556 02/15/12 0502  NA 135 138  K 4.1 4.2  CL 102 104  CO2 27 29  GLUCOSE 98 103*  BUN  22 24*  CREATININE 1.36* 1.39*  CALCIUM 8.4 8.3*    Studies/Results: Dg Chest 2 View  02/14/2012  *RADIOLOGY REPORT*  Clinical Data: Rales on clinical exam  CHEST - 2 VIEW  Comparison: 08/10/2011  Findings: Cardiomediastinal silhouette is stable.  Status post median sternotomy again noted.  Stable reticular nodular interstitial prominence and fibrotic changes at lung bases.  No acute infiltrate or pulmonary edema.  IMPRESSION: No active disease.  Stable reticulonodular interstitial prominence and fibrotic changes at lung bases.  Original Report Authenticated By: Natasha Mead, M.D.   Dg Ankle Complete Right  02/14/2012  *RADIOLOGY REPORT*  Clinical Data: Right ankle bruising  RIGHT ANKLE - COMPLETE 3+ VIEW  Comparison: None.  Findings: The ankle mortise intact.  Talar dome is normal.  No malleolar fracture.  No joint effusion.  IMPRESSION: No acute osseous abnormality.  Original Report Authenticated By: Genevive Bi, M.D.   Ct Femur Right Wo Contrast  02/14/2012  *RADIOLOGY REPORT*  Clinical Data: Right leg pain and swelling.  Hematoma.  CT OF THE RIGHT FEMUR WITHOUT CONTRAST  Comparison: None.  Findings: Partial visualization of visceral pelvis appears within normal limits.  Atherosclerosis is present.  There is no hematoma identified in the thigh.  Mild soft tissue stranding and superficial fluid is present, which is nonspecific, most commonly associated with edema.  The anterior and posterior muscular compartments are within normal limits.  Adductor compartment appears normal.  There is no fracture.  No osteolysis of the femur.  IMPRESSION: Mild nonspecific soft tissue stranding of the thigh most compatible with edema.  No hematoma or focal fluid collection.  Original Report Authenticated By: Andreas Newport, M.D.   Ct Tibia Fibula Right Wo Contrast  02/14/2012  *RADIOLOGY REPORT*  Clinical Data: Hematoma.  Right leg swelling.  CT OF THE RIGHT TIBIA FIBULA WITHOUT CONTRAST  Comparison: None.  Findings:  Tricompartmental right knee osteoarthritis is present with moderate effusion.  Chondrocalcinosis is also present.  There is no fracture. Tibia and fibula appear intact.  Small vessel atherosclerosis is present below the knee.  Diffuse infiltration of subcutaneous tissues is present which probably represents edema or cellulitis in the appropriate clinical setting.  Varices are present in the medial leg.  The soft tissues demonstrate a large Baker's cyst dissecting inferiorly with heterogeneous material. This probably represents hemorrhage into a Baker's cyst with either subsequent dissection along the deep  surface of the medial head of the gastrocnemius or intramuscular hematoma in the medial posterior muscular compartment.  The flexor and extensor tendons appear intact.  There is no gas in the soft tissues.  Hematoma measures 18 cm long axis.  Widest axial dimensions are 64 mm transverse by 39 mm AP.  IMPRESSION: Hematoma of the leg extending from Baker's cyst inferiorly in the posterior muscular compartment measuring at least 18 cm x 6.4 cm x 3.9 cm.  Original Report Authenticated By: Andreas Newport, M.D.   Nm Pulmonary Per & Vent  02/15/2012  *RADIOLOGY REPORT*  Clinical Data:  Dyspnea, elevated D-dimer  NUCLEAR MEDICINE VENTILATION - PERFUSION LUNG SCAN  Technique:  Wash-in, equilibrium, and wash-out phase ventilation images were obtained using Xe-133 gas.  Perfusion images were obtained in multiple projections after intravenous injection of Tc- 34m MAA.  Radiopharmaceuticals:  19.0 mCi Xe-133 gas and 3.0 mCi Tc-52m MAA.  Comparison: Chest radiograph 02/14/2012  Findings:  Ventilation:  There there is mild decreased ventilation to the upper lobes.  There is mild air trapping in the lower lobes.  No focal ventilation defect.  Perfusion:  There is decreased regional perfusion to the left and right upper lobes as well as the lower lobes.  This is a regional pattern rather than the segmental pattern.  No wedge  shaped peripheral perfusion defects.  IMPRESSION:  Abnormal perfusion to the bilateral upper lobes and lower lobes. Legrand Rams this to represent sequelae of chronic obstructive pulmonary disease rather acute pulmonary embolism. Low probability for acute pulmonary embolism.  Original Report Authenticated By: Genevive Bi, M.D.   Dg Knee Complete 4 Views Right  02/14/2012  *RADIOLOGY REPORT*  Clinical Data: Right leg swelling  RIGHT KNEE - COMPLETE 4+ VIEW  Comparison: None.  Findings: There is no fracture or dislocation of the right knee. No joint effusion.  There is chondrocalcinosis in the medial and lateral compartment.  IMPRESSION:  1.  No acute findings. 2.  Chondrocalcinosis.  Original Report Authenticated By: Genevive Bi, M.D.   Dg Foot Complete Right  02/14/2012  *RADIOLOGY REPORT*  Clinical Data: Right foot bruising  RIGHT FOOT COMPLETE - 3+ VIEW  Comparison: none  Findings: No fracture or dislocation of the right foot.  No soft tissue abnormality.  IMPRESSION: No acute osseous abnormality.  Original Report Authenticated By: Genevive Bi, M.D.    Medications: I have reviewed the patient's current medications.   Physical exam GENERAL- alert HEAD- normal atraumatic, no neck masses, normal thyroid, no jvd RESPIRATORY- appears well, vitals normal, no respiratory distress, acyanotic, normal RR, ear and throat exam is normal, neck free of mass or lymphadenopathy, chest clear, no wheezing, crepitations, rhonchi, normal symmetric air entry CVS- regular rate and rhythm, S1, S2 normal, no murmur, click, rub or gallop ABDOMEN- abdomen is soft without significant tenderness, masses, organomegaly or guarding NEURO- hard of hearing. EXTREMITIES- extremities normal, atraumatic, no cyanosis or edema  Plan   * Right leg hematoma- seems better, less swelling and pain. Expected to resorb. Ambulate. * DYSPNEA- likely related to chronic lung disease. No PE/EF ok.  * Aortic valve disorder- on coumadin.  INR 3.18. Continue per pharmacy. * ARF (acute renal failure)- ?prerenal, monitor. * Disposition- hopefully home in next day or 2.    Sherwood Castilla 02/16/2012 8:18 AM Pager: [phone].

## 2012-02-16 NOTE — Progress Notes (Signed)
ANTICOAGULATION CONSULT NOTE - Follow Up Consult  Pharmacy Consult for warfarin Indication: mechanical aortic valve  Allergies  Allergen Reactions  . Penicillins Anaphylaxis   Vital Signs: Temp: 97.7 F (36.5 C) (05/08 0509) Temp src: Oral (05/08 0509) BP: 127/63 mmHg (05/08 0509) Pulse Rate: 69  (05/08 0509)  Labs:  Basename 02/16/12 0556 02/15/12 0502 02/14/12 1405  HGB 8.4* -- 8.8*  HCT 25.5* -- 26.6*  PLT 274 -- 329  APTT -- -- --  LABPROT 32.9* 45.9* 46.6*  INR 3.16* 4.84* 4.93*  HEPARINUNFRC -- -- --  CREATININE 1.36* 1.39* 1.49*  CKTOTAL -- -- --  CKMB -- -- --  TROPONINI -- -- --    Estimated Creatinine Clearance: 44.9 ml/min (by C-G formula based on Cr of 1.36).   Medications:  Warfarin 5mg  MWF and 7.5 mg T/TR/Sat/Sun  Assessment: 78 yr old male admitted with Rt leg/foot swelling. Doppler shows no DVT. Pt is on coumadin PTA for mechanical aortic valve.   INR within therapeutic range this AM at 3.16  Hematoma expected to reabsorp  Goal of Therapy:  INR goal 2.5-3.5   Plan:  1) Coumadin 5 mg po x 1 dose today 2) Follow up AM INR  Thank you. Okey Regal, PharmD [facility] Pharmacist [phone]

## 2012-02-16 NOTE — Evaluation (Signed)
Physical Therapy Evaluation Patient Details Name: NOELL SHULAR MRN: [id number] DOB: 1930-06-17 Today's Date: 02/16/2012 Time: 0922-0955 PT Time Calculation (min): 33 min  PT Assessment / Plan / Recommendation Clinical Impression  Pt is 78 yo male with RLE pain and swelling which is decreasing balance and ability to mobilize which increases risk for generalized weakness and falls with his age.  Recommend RW and HHPT upon d/c and acute PT will follow.    PT Assessment  Patient needs continued PT services    Follow Up Recommendations  Home health PT;Supervision for mobility/OOB    Equipment Recommendations  Rolling walker with 5" wheels    Frequency Min 3X/week    Precautions / Restrictions Precautions Precautions: Fall Restrictions Weight Bearing Restrictions: No Other Position/Activity Restrictions: elevate RLE   Pertinent Vitals/Pain 5/10 pain right ankle/ calf 2/4 DOE      Mobility  Bed Mobility Bed Mobility: Supine to Sit Supine to Sit: 7: Independent;HOB flat Transfers Transfers: Sit to Stand;Stand to Sit Sit to Stand: 6: Modified independent (Device/Increase time);From bed;With upper extremity assist Stand to Sit: 6: Modified independent (Device/Increase time);With upper extremity assist;To chair/3-in-1 Ambulation/Gait Ambulation/Gait Assistance: 5: Supervision Ambulation Distance (Feet): 60 Feet Assistive device: Rolling walker Ambulation/Gait Assistance Details: used RW to take pressure off RLE which did help to control pain.  Recommend RW for home.  Educated ot on sequencing with gait and discussed stairs. Gait Pattern: Step-to pattern;Right foot flat;Decreased dorsiflexion - right Gait velocity: decreased General Gait Details: decreased df right ankle Stairs: No Wheelchair Mobility Wheelchair Mobility: No    Exercises General Exercises - Lower Extremity Ankle Circles/Pumps: AROM;20 reps;Right;Seated;Supine   PT Goals Acute Rehab PT Goals PT Goal  Formulation: With patient/family Time For Goal Achievement: 03/01/12 Potential to Achieve Goals: Good Pt will go Sit to Stand: with modified independence PT Goal: Sit to Stand - Progress: Goal set today Pt will go Stand to Sit: with modified independence PT Goal: Stand to Sit - Progress: Goal set today Pt will Ambulate: 51 - 150 feet;with modified independence;with rolling walker PT Goal: Ambulate - Progress: Goal set today Pt will Go Up / Down Stairs: 3-5 stairs;with rail(s);with supervision PT Goal: Up/Down Stairs - Progress: Goal set today Pt will Perform Home Exercise Program: Independently PT Goal: Perform Home Exercise Program - Progress: Goal set today  Visit Information  Last PT Received On: 02/16/12 Assistance Needed: +1    Subjective Data  Subjective: "my bowels were all messed up and then this happened" Patient Stated Goal: return home   Prior Functioning  Home Living Lives With: Spouse Available Help at Discharge: Family Type of Home: House Home Access: Stairs to enter Secretary/administrator of Steps: 4 Entrance Stairs-Rails: Right Home Layout: One level Bathroom Shower/Tub: Naval architect Equipment: Straight cane Additional Comments: pt ambulated with a SPC but has been hard since leg began to hurt and swell Prior Function Level of Independence: Independent with assistive device(s) Able to Take Stairs?: Yes Driving: Yes (not since leg swelling) 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[employer]    Cognition  Overall Cognitive Status: Appears within functional limits for tasks assessed/performed Arousal/Alertness: Awake/alert Orientation Level: Appears intact for tasks assessed Behavior During Session: [facility] for tasks performed Cognition - Other Comments: pt slow to respond but likely due to [facility]    Extremity/Trunk Assessment Right Upper Extremity Assessment RUE ROM/Strength/Tone: [facility] for tasks assessed RUE Coordination: WFL - gross/fine  motor Left Upper Extremity Assessment LUE ROM/Strength/Tone: WFL for tasks assessed LUE Coordination: WFL -  gross/fine motor Right Lower Extremity Assessment RLE ROM/Strength/Tone: Deficits;Due to pain (due to swellling) RLE ROM/Strength/Tone Deficits: decreased df of right ankle due to swelling.  Strength of hip and knee WFL, ankle NT due to pain RLE Sensation: Deficits RLE Sensation Deficits: decreased sensation and proprioception right ankle due to swelling RLE Coordination: WFL - gross/fine motor Left Lower Extremity Assessment LLE ROM/Strength/Tone: WFL for tasks assessed LLE Sensation: WFL - Light Touch;WFL - Proprioception LLE Coordination: WFL - gross/fine motor Trunk Assessment Trunk Assessment: Normal   Balance Balance Balance Assessed: Yes Dynamic Standing Balance Dynamic Standing - Balance Support: No upper extremity supported Dynamic Standing - Level of Assistance: 4: Min assist Dynamic Standing - Comments: decreased standing balance due to pt trying to keep wt off RLE  End of Session PT - End of Session Equipment Utilized During Treatment: Gait belt Activity Tolerance: Patient limited by pain Patient left: in chair;with call bell/phone within reach;with family/visitor present Nurse Communication: Mobility status   Khady Vandenberg, [place] 02/16/2012, 10:14 AM  Lyanne Co, PT  Acute Rehab Services  [phone]

## 2012-02-17 DIAGNOSIS — Z7901 Long term (current) use of anticoagulants: Secondary | ICD-10-CM

## 2012-02-17 DIAGNOSIS — Z954 Presence of other heart-valve replacement: Secondary | ICD-10-CM

## 2012-02-17 DIAGNOSIS — M7981 Nontraumatic hematoma of soft tissue: Secondary | ICD-10-CM

## 2012-02-17 DIAGNOSIS — D684 Acquired coagulation factor deficiency: Secondary | ICD-10-CM

## 2012-02-17 DIAGNOSIS — R0602 Shortness of breath: Secondary | ICD-10-CM

## 2012-02-17 LAB — CBC
HCT: 26.4 % — ABNORMAL LOW (ref 39.0–52.0)
Hemoglobin: 8.7 g/dL — ABNORMAL LOW (ref 13.0–17.0)
MCH: 30.2 pg (ref 26.0–34.0)
MCHC: 33 g/dL (ref 30.0–36.0)
RDW: 14 % (ref 11.5–15.5)

## 2012-02-17 LAB — BASIC METABOLIC PANEL
BUN: 21 mg/dL (ref 6–23)
Calcium: 8.7 mg/dL (ref 8.4–10.5)
Creatinine, Ser: 1.36 mg/dL — ABNORMAL HIGH (ref 0.50–1.35)
GFR calc Af Amer: 55 mL/min — ABNORMAL LOW (ref >90)
GFR calc non Af Amer: 48 mL/min — ABNORMAL LOW (ref >90)
Glucose, Bld: 105 mg/dL — ABNORMAL HIGH (ref 70–99)

## 2012-02-17 LAB — PROTIME-INR: INR: 2.57 — ABNORMAL HIGH (ref 0.00–1.49)

## 2012-02-17 MED ORDER — WARFARIN SODIUM 7.5 MG PO TABS
7.5000 mg | ORAL_TABLET | Freq: Once | ORAL | Status: DC
  Filled 2012-02-17: qty 1

## 2012-02-17 MED ORDER — POLYSACCHARIDE IRON COMPLEX 150 MG PO CAPS: 150.0000 mg | ORAL_CAPSULE | Freq: Every day | ORAL | Status: DC

## 2012-02-17 MED ORDER — HYDROCODONE-ACETAMINOPHEN 5-325 MG PO TABS: 1.0000 | ORAL_TABLET | ORAL | Status: AC | PRN

## 2012-02-17 MED ORDER — SENNOSIDES-DOCUSATE SODIUM 8.6-50 MG PO TABS: 1.0000 | ORAL_TABLET | Freq: Every evening | ORAL | Status: AC | PRN

## 2012-02-17 NOTE — Discharge Instructions (Signed)
Anticoagulation, Generic  Anticoagulants are medications used to prevent clots from developing in your veins. These medications are also known as blood thinners. If blood clots are untreated, they could travel to your lungs. This is called a pulmonary embolus. A blood clot in your lungs can be fatal.   Caregivers often use anticoagulants to prevent clots following surgery. Anticoagulants are also used along with aspirin when the heart is not getting enough blood.  Another anticoagulant called warfarin is started 2 to 3 days after a rapid-acting injectable anticoagulant is started. The rapid-acting anticoagulants are usually continued until warfarin has begun to work. Your caregiver will judge this length of time by measuring a blood test known as the International Normalization Ratio (INR). An INR of 2 to 3 is desirable for most medical conditions. This means that your blood is at the necessary and best level to prevent clots.  RISKS AND COMPLICATIONS   If you have received recent epidural anesthesia, spinal anesthesia, or a spinal tap while receiving anticoagulants, you are at risk for developing a blood clot in or around the spine. This condition could result in long-term or permanent paralysis.   Because anticoagulants thin your blood, severe bleeding may occur from any tissue or organ. Symptoms of the blood being too thin may include:   Bleeding from the nose or gums that does not stop quickly.   Unusual bruising or bruising easily.   Swelling or pain at an injection site.   A cut that does not stop bleeding within 10 minutes.   Continual nausea for more than 1 day or vomiting blood.   Coughing up blood.   Blood in the urine which may appear as pink, red, or brown urine.   Blood in bowel movements which may appear as red, dark or black stools.   Sudden weakness or numbness of the face, arm, or leg, especially on one side of the body.   Sudden confusion.   Trouble speaking (aphasia) or  understanding.   Sudden trouble seeing in one or both eyes.   Sudden trouble walking.   Dizziness.   Loss of balance or coordination.   Severe pain, such as a headache, joint pain, or back pain.   Fever.  HOME CARE INSTRUCTIONS    Due to the complications of anticoagulants, it is very important that you take your anticoagulant as directed by your caregiver. Anticoagulants need to be taken exactly as instructed. Be sure you understand all your anticoagulant instructions.   Changes in medicines, supplements, diet, and illness can affect your anticoagulation therapy. Be sure to inform your caregivers of any of these changes.   While on anticoagulants, you will need to have blood tests done routinely as directed by your caregivers.   Be careful not to cut yourself when using sharp objects.   Avoid heavy or variable alcohol use. Consume alcohol only in very limited quantities. General alcohol intake guidelines are 1 drink for nonpregnant women and 2 drinks for men per day. (1 drink = 5 ounces of wine, 12 ounces of beer, or 1  ounces of liquor). A sudden increase in alcohol use can increase your risk of bleeding. Chronic alcohol use can cause warfarin to be less effective.   Limit physical activities or sports that could result in a fall or cause injury.   It is extremely important that you tell all of your caregivers and dentist that you are taking an anticoagulant, especially if you are injured or plan to have any type   Follow up with your laboratory test and caregiver appointments as directed. It is very important to keep your appointments. Not keeping appointments could result in a chronic or permanent injury, pain, or disability.  SEEK MEDICAL CARE IF:   You develop any rashes.   You have any worsening of the condition for which you are receiving anticoagulation therapy.  SEEK IMMEDIATE MEDICAL CARE IF:   Bleeding from the nose or gums does not stop  quickly.   You have unusual bruising or are bruising easily.   Swelling or pain occurs at an injection site.   A cut does not stop bleeding within 10 minutes.   You have continual nausea for more than 1 day or are vomiting blood.   You are coughing up blood.   You have blood in the urine.   You have dark or black stools.   You have sudden weakness or numbness of the face, arm, or leg, especially on one side of the body.   You have sudden confusion.   You have trouble speaking (aphasia) or understanding.   You have sudden trouble seeing in one or both eyes.   You have sudden trouble walking.   You have dizziness.   You have a loss of balance or coordination.   You have severe pain, such as a headache, joint pain, or back pain.   You have a serious fall or head injury, even if you are not bleeding.   You have an oral temperature above 102 F (38.9 C), not controlled by medicine.  ANY OF THESE SYMPTOMS MAY REPRESENT A SERIOUS PROBLEM THAT IS AN EMERGENCY. Do not wait to see if the symptoms will go away. Get medical help right away. Call your local emergency services (911 in [place] DO NOT drive yourself to the hospital. MAKE SURE YOU:   Understand these instructions.   Will watch your condition.   Will get help right away if you are not doing well or get worse.  Document Released: 09/27/2005 Document Revised: 09/16/2011 Document Reviewed: 05/01/2008 [facility] Patient Information 2012 [place], [place].Warfarin Coagulopathy Warfarin (Coumadin) coagulopathy refers to bleeding that may occur as a complication of the medication warfarin. Warfarin is an oral blood thinner (anticoagulant). Warfarin is used for many problems where thinning of the blood is needed to prevent blood clots. It usually takes 3 to 4 days of treatment with warfarin for the blood to be thinned to the target range. Blood tests will be done routinely to measure how fast your blood clots. The results of the  blood tests will help determine your anticoagulant dose. Every medication has potential side effects or complications. Bleeding is the most common and most serious complication of warfarin. The amount of bleeding may be related to the dose of warfarin, the length of treatment, diet, underlying medical conditions, and the use of other medications or supplements. CAUSES  Intentional or accidental overdose.   Medication, herbal, supplement, or alcohol interactions.   Dietary changes.   Underlying medical conditions.  SYMPTOMS Severe bleeding may occur from any tissue or organ. Symptoms of the blood being too thin may include:  Bleeding from the nose or gums that does not stop quickly.   Unusual bruising or bruising easily.   Swelling or pain at an injection site.   A cut that does not stop bleeding within 10 minutes.   Continual nausea for more than 1 day or vomiting blood.   Coughing up blood.   Blood in the urine which may appear  as pink, red, or brown urine.   Blood in bowel movements which may appear as red, dark or black stools.   Sudden weakness or numbness of the face, arm, or leg, especially on one side of the body.   Sudden confusion.   Trouble speaking (aphasia) or understanding.   Sudden trouble seeing in one or both eyes.   Sudden trouble walking.   Dizziness.   Loss of balance or coordination.   Severe pain, such as a headache, joint pain, or back pain.   Fever.  HOME CARE INSTRUCTIONS  Follow up with your laboratory test and caregiver appointments as directed. It is very important to keep your appointments. Not keeping appointments could result in a chronic or permanent injury, pain, or disability.   Do not resume taking warfarin until directed to do so by your caregiver. Take warfarin exactly as directed by your caregiver. It is recommended that you take your warfarin dose at the same time of the day. It is preferred that you take your warfarin in the  evening. This allows you to get your laboratory test results and if necessary, adjust your warfarin dose in a timely manner. Follow your caregiver's instructions if you accidentally take an extra dose or miss a dose of warfarin. It is very important to take warfarin as directed since bleeding or blood clots could result in chronic or permanent injury, pain, or disability.   Some foods interfere with the effectiveness of warfarin. Eating large amounts of foods high in vitamin K can cause warfarin to be less effective. Changing to a diet low in foods containing vitamin K may lead to an excessive warfarin effect. Eat what you normally eat and keep the vitamin K content of your diet consistent. Consult your caregiver before making major dietary changes.   Some vitamins, supplements, and herbal products interfere with the effectiveness of warfarin. Vitamin E may increase the anticoagulant effects of warfarin. Vitamin K may can cause warfarin to be less effective. Consult your caregiver before changing or taking a new vitamin, supplement, or herbal product.   Several medications interfere with the effectiveness of warfarin. Pain relieving medications, antibiotics, and medications that decrease stomach acid are examples of some medications that can lead to an excessive warfarin effect. Warfarin may also interfere with the effectiveness of your other medicines. Consult your caregiver before stopping, changing, or taking new medications.   Some medical conditions may increase your risk for bleeding while you are taking warfarin. A fever, diarrhea lasting more than a day, worsening heart failure, or worsening liver function are some medical conditions that could affect warfarin. Contact your caregiver if you have any of these medical conditions.   Be careful not to cut yourself when using sharp objects.   Avoid heavy or variable alcohol use. Consume alcohol only in very limited quantities. General alcohol intake  guidelines are 1 drink for women and 2 drinks for men per day. (1 drink = 5 ounces of wine, 12 ounces of beer, or 1 ounces of liquor). A sudden increase in alcohol use can increase your risk of bleeding. Chronic alcohol use can cause warfarin to be less effective.   Limit physical activities or sports that could result in a fall or cause injury.   Inform all your caregivers and your dentist that you take warfarin.  SEEK IMMEDIATE MEDICAL CARE IF:  Bleeding from the nose or gums does not stop quickly.   You have unusual bruising or are bruising easily.   Swelling  or pain occurs at an injection site.   A cut does not stop bleeding within 10 minutes.   You have continual nausea for more than 1 day or are vomiting blood.   You are coughing up blood.   You have blood in the urine.   You have dark or black stools.   You have sudden weakness or numbness of the face, arm, or leg, especially on one side of the body.   You have sudden confusion.   You have trouble speaking (aphasia) or understanding.   You have sudden trouble seeing in one or both eyes.   You have sudden trouble walking.   You have dizziness.   You have a loss of balance or coordination.   You have severe pain, such as a headache, joint pain, or back pain.   You have a serious fall or head injury, even if you are not bleeding.   You have an oral temperature above 102 F (38.9 C), not controlled by medicine.  Any of these symptoms may represent a serious problem that is an emergency. Do not wait to see if the symptoms will go away. Get medical help right away. Call your local emergency services (911 in [place] DO NOT drive yourself to the hospital. Document Released: 09/05/2006 Document Revised: 09/16/2011 Document Reviewed: 09/11/2007 [facility] Patient Information 2012 [place], [place].

## 2012-02-17 NOTE — Progress Notes (Signed)
ANTICOAGULATION CONSULT NOTE - Follow Up Consult  Pharmacy Consult for warfarin Indication: mechanical aortic valve  Allergies  Allergen Reactions  . Penicillins Anaphylaxis   Vital Signs: Temp: 98.1 F (36.7 C) (05/09 0557) Temp src: Oral (05/09 0557) BP: 121/65 mmHg (05/09 0557) Pulse Rate: 76  (05/09 0557)  Labs:  Basename 02/17/12 0625 02/16/12 0556 02/15/12 0502 02/14/12 1405  HGB 8.7* 8.4* -- --  HCT 26.4* 25.5* -- 26.6*  PLT 276 274 -- 329  APTT -- -- -- --  LABPROT 28.0* 32.9* 45.9* --  INR 2.57* 3.16* 4.84* --  HEPARINUNFRC -- -- -- --  CREATININE 1.36* 1.36* 1.39* --  CKTOTAL -- -- -- --  CKMB -- -- -- --  TROPONINI -- -- -- --    Estimated Creatinine Clearance: 44.9 ml/min (by C-G formula based on Cr of 1.36).   Medications:  Warfarin 5mg  MWF and 7.5 mg T/TR/Sat/Sun  Assessment: 78 yr old male admitted with Rt leg/foot swelling. Doppler shows no DVT. Pt is on coumadin PTA for mechanical aortic valve.   INR trending down but still within therapeutic range this AM at 2.5.  Hematoma expected to reabsorp  Goal of Therapy:  INR goal 2.5-3.5   Plan:  1) Coumadin 7.5 mg po x 1 dose today 2) Follow up AM INR  Thank you. Sheppard Coil PharmD [facility] Pharmacist [phone]

## 2012-02-17 NOTE — Discharge Summary (Signed)
DISCHARGE SUMMARY  [name]  [name]#: [id number]  DOB:11-05-1929  Date of Admission: 02/14/2012 Date of Discharge: 02/17/2012  Attending Physician:Viraat Vanpatten  Patient's WUJ:[name],[name] [name] Maduro, MD, MD  Consults: Ricky Stabs.  Discharge Diagnoses: Present on Admission:  .Right leg pain .DYSPNEA .Aortic valve disorder .ARF (acute renal failure) .Warfarin-induced coagulopathy .Hematoma of leg, right, sequela  Hospital Course: [name] came in with right leg pain and swelling, and was found to have hematoma of the same leg, in setting of  INR(4.93). He had CT scan of the leg, which confirmed the hematoma. The coumadin which he takes for prosthetic aortic valve, was adjusted per pharmacy. Dr Early(VVSW) graciously saw [name] in consult, and recommended conservative approach, as he felt the hematoma will eventually resorb over time. The leg swelling and pain has gradually subsided, and [name] was evaluated by Physical therapy, who recommended home PT, which has been ordered. [name] had also complained of shortness of breath at admission, raising concern for PE versus CHf versus other cardiopulmonary issues. A V/Q scan suggested low probability for PE while a 2decho showed an EF of 60-65%, with no significant findings. [name] came in with a hemoglobin of 8.8g/dl, and this has stayed the same. He was started on Iron supplements. He had AKI, felt to be prerenal. On day of discharge bun/cr was 21/1.36, from 15/1.16 at admission. He feels better. His INR today is 2.57. He should continue coumadin as he was previously taking, and should follow with Dr [name] in the next few days for INR/BMP/CBC follow up. He will have HHS. He is discharged in relatively stable condition to the care of his family.   Medication List  As of 02/17/2012  7:29 AM   TAKE these medications         acetaminophen 500 MG tablet   Commonly known as: TYLENOL   Take 500 mg by mouth every 6 (six) hours as needed.  For pain      aspirin EC 81 MG tablet   Take 81 mg by mouth daily.      atorvastatin 10 MG tablet   Commonly known as: LIPITOR   Take 10 mg by mouth daily.      brinzolamide 1 % ophthalmic suspension   Commonly known as: AZOPT   Place 1 drop into both eyes 2 (two) times daily.      cholecalciferol 1000 UNITS tablet   Commonly known as: VITAMIN D   Take 1,000 Units by mouth daily.      furosemide 20 MG tablet   Commonly known as: LASIX   Take 20 mg by mouth 2 (two) times daily.      HYDROcodone-acetaminophen 5-325 MG per tablet   Commonly known as: NORCO   Take 1-2 tablets by mouth every 4 (four) hours as needed.      iron polysaccharides 150 MG capsule   Commonly known as: NIFEREX   Take 1 capsule (150 mg total) by mouth daily.      LUMIGAN 0.01 % Soln   Generic drug: bimatoprost   Place 1 drop into both eyes At bedtime.      metoprolol 50 MG tablet   Commonly known as: LOPRESSOR   Take 25 mg by mouth daily.      senna-docusate 8.6-50 MG per tablet   Commonly known as: Senokot-S   Take 1 tablet by mouth at bedtime as needed.      tiotropium 18 MCG inhalation capsule   Commonly known as: SPIRIVA  Place 18 mcg into inhaler and inhale daily.      warfarin 5 MG tablet   Commonly known as: COUMADIN   Take 5-7.5 mg by mouth daily. Take 1 tablet (5mg ) on mondays,wednesdays and fridays. Take one and one half tablets (7.5mg ) on all other days             Day of Discharge BP 121/65  Pulse 76  Temp(Src) 98.1 F (36.7 C) (Oral)  Resp 19  Ht 6\' 2"  (1.88 m)  Wt 72 kg (158 lb 11.7 oz)  BMI 20.38 kg/m2  SpO2 95%  Physical Exam: Right leg slightly swollen, less tender.  Results for orders placed during the hospital encounter of 02/14/12 (from the past 24 hour(s))  PROTIME-INR     Status: Abnormal   Collection Time   02/17/12  6:25 AM      Component Value Range   Prothrombin Time 28.0 (*) 11.6 - 15.2 (seconds)   INR 2.57 (*) 0.00 - 1.49   CBC     Status:  Abnormal   Collection Time   02/17/12  6:25 AM      Component Value Range   WBC 7.3  4.0 - 10.5 (K/uL)   RBC 2.88 (*) 4.22 - 5.81 (MIL/uL)   Hemoglobin 8.7 (*) 13.0 - 17.0 (g/dL)   HCT 16.1 (*) 09.6 - 52.0 (%)   MCV 91.7  78.0 - 100.0 (fL)   MCH 30.2  26.0 - 34.0 (pg)   MCHC 33.0  30.0 - 36.0 (g/dL)   RDW 04.5  40.9 - 81.1 (%)   Platelets 276  150 - 400 (K/uL)    Disposition: home today.   Follow-up Appts: Discharge Orders    Future Orders Please Complete By Expires   Diet - low sodium heart healthy      Increase activity slowly         Follow-up Information    Follow up with [name], MD in 3 days. (to have inr checked)    Contact information:   [address] [facility] [address] [phone]          Tests Needing Follow-up: PT/INR/cbc/bmp in 3-5 days.  Time spent in discharge (includes decision making & examination of pt): 15 minutes  Signed: Rainier Feuerborn 02/17/2012, 7:29 AM

## 2012-02-17 NOTE — Progress Notes (Signed)
Patient [name]. Christell Constant, 78 year old white male looks forward to going home tomorrow.  Patient feels some anxiety about losing the attention he receives here at the hospital.  Patient expressed appreciation for Chaplain's provision of pastoral prayer, presence, and conversation.  No follow-up needed.

## 2012-02-25 ENCOUNTER — Ambulatory Visit: Payer: Self-pay | Admitting: Cardiovascular Disease

## 2012-02-25 DIAGNOSIS — Z952 Presence of prosthetic heart valve: Secondary | ICD-10-CM

## 2012-02-25 DIAGNOSIS — I359 Nonrheumatic aortic valve disorder, unspecified: Secondary | ICD-10-CM

## 2012-02-25 DIAGNOSIS — Z7901 Long term (current) use of anticoagulants: Secondary | ICD-10-CM

## 2012-03-03 ENCOUNTER — Ambulatory Visit: Payer: Self-pay | Admitting: Cardiology

## 2012-03-03 DIAGNOSIS — Z952 Presence of prosthetic heart valve: Secondary | ICD-10-CM

## 2012-03-03 DIAGNOSIS — Z7901 Long term (current) use of anticoagulants: Secondary | ICD-10-CM

## 2012-03-03 DIAGNOSIS — I359 Nonrheumatic aortic valve disorder, unspecified: Secondary | ICD-10-CM

## 2012-03-10 ENCOUNTER — Ambulatory Visit (INDEPENDENT_AMBULATORY_CARE_PROVIDER_SITE_OTHER): Payer: Medicare Other | Admitting: Internal Medicine

## 2012-03-10 DIAGNOSIS — Z7901 Long term (current) use of anticoagulants: Secondary | ICD-10-CM

## 2012-03-10 DIAGNOSIS — Z954 Presence of other heart-valve replacement: Secondary | ICD-10-CM

## 2012-03-10 DIAGNOSIS — Z952 Presence of prosthetic heart valve: Secondary | ICD-10-CM

## 2012-03-10 DIAGNOSIS — I359 Nonrheumatic aortic valve disorder, unspecified: Secondary | ICD-10-CM

## 2012-03-14 ENCOUNTER — Ambulatory Visit: Payer: Self-pay | Admitting: Cardiovascular Disease

## 2012-03-14 DIAGNOSIS — Z952 Presence of prosthetic heart valve: Secondary | ICD-10-CM

## 2012-03-14 DIAGNOSIS — Z7901 Long term (current) use of anticoagulants: Secondary | ICD-10-CM

## 2012-03-14 DIAGNOSIS — I359 Nonrheumatic aortic valve disorder, unspecified: Secondary | ICD-10-CM

## 2012-03-17 ENCOUNTER — Other Ambulatory Visit: Payer: Self-pay | Admitting: Cardiovascular Disease

## 2012-03-27 ENCOUNTER — Ambulatory Visit (INDEPENDENT_AMBULATORY_CARE_PROVIDER_SITE_OTHER): Payer: Medicare Other | Admitting: *Deleted

## 2012-03-27 DIAGNOSIS — Z952 Presence of prosthetic heart valve: Secondary | ICD-10-CM

## 2012-03-27 DIAGNOSIS — Z954 Presence of other heart-valve replacement: Secondary | ICD-10-CM

## 2012-03-27 DIAGNOSIS — Z7901 Long term (current) use of anticoagulants: Secondary | ICD-10-CM

## 2012-03-27 DIAGNOSIS — I359 Nonrheumatic aortic valve disorder, unspecified: Secondary | ICD-10-CM

## 2012-04-12 ENCOUNTER — Ambulatory Visit (INDEPENDENT_AMBULATORY_CARE_PROVIDER_SITE_OTHER): Payer: Medicare Other | Admitting: Pharmacist

## 2012-04-12 DIAGNOSIS — Z7901 Long term (current) use of anticoagulants: Secondary | ICD-10-CM

## 2012-04-12 DIAGNOSIS — I359 Nonrheumatic aortic valve disorder, unspecified: Secondary | ICD-10-CM

## 2012-04-12 DIAGNOSIS — Z954 Presence of other heart-valve replacement: Secondary | ICD-10-CM

## 2012-04-12 DIAGNOSIS — Z952 Presence of prosthetic heart valve: Secondary | ICD-10-CM

## 2012-04-12 LAB — POCT INR: INR: 2.6

## 2012-05-10 ENCOUNTER — Ambulatory Visit (INDEPENDENT_AMBULATORY_CARE_PROVIDER_SITE_OTHER): Payer: Medicare Other | Admitting: *Deleted

## 2012-05-10 DIAGNOSIS — Z954 Presence of other heart-valve replacement: Secondary | ICD-10-CM

## 2012-05-10 DIAGNOSIS — Z952 Presence of prosthetic heart valve: Secondary | ICD-10-CM

## 2012-05-10 DIAGNOSIS — I359 Nonrheumatic aortic valve disorder, unspecified: Secondary | ICD-10-CM

## 2012-05-10 DIAGNOSIS — Z7901 Long term (current) use of anticoagulants: Secondary | ICD-10-CM

## 2012-05-10 LAB — POCT INR: INR: 3.3

## 2012-06-08 ENCOUNTER — Ambulatory Visit (INDEPENDENT_AMBULATORY_CARE_PROVIDER_SITE_OTHER): Payer: Medicare Other | Admitting: Pharmacist

## 2012-06-08 DIAGNOSIS — Z952 Presence of prosthetic heart valve: Secondary | ICD-10-CM

## 2012-06-08 DIAGNOSIS — Z7901 Long term (current) use of anticoagulants: Secondary | ICD-10-CM

## 2012-06-08 DIAGNOSIS — I359 Nonrheumatic aortic valve disorder, unspecified: Secondary | ICD-10-CM

## 2012-06-08 DIAGNOSIS — Z954 Presence of other heart-valve replacement: Secondary | ICD-10-CM

## 2012-06-08 LAB — POCT INR: INR: 2.6

## 2012-07-05 ENCOUNTER — Ambulatory Visit (INDEPENDENT_AMBULATORY_CARE_PROVIDER_SITE_OTHER): Payer: Medicare Other | Admitting: *Deleted

## 2012-07-05 DIAGNOSIS — I359 Nonrheumatic aortic valve disorder, unspecified: Secondary | ICD-10-CM

## 2012-07-05 DIAGNOSIS — Z7901 Long term (current) use of anticoagulants: Secondary | ICD-10-CM

## 2012-07-05 DIAGNOSIS — Z954 Presence of other heart-valve replacement: Secondary | ICD-10-CM

## 2012-07-05 DIAGNOSIS — Z952 Presence of prosthetic heart valve: Secondary | ICD-10-CM

## 2012-07-05 LAB — POCT INR: INR: 4.2

## 2012-07-18 ENCOUNTER — Ambulatory Visit (INDEPENDENT_AMBULATORY_CARE_PROVIDER_SITE_OTHER): Payer: Medicare Other

## 2012-07-18 DIAGNOSIS — Z954 Presence of other heart-valve replacement: Secondary | ICD-10-CM

## 2012-07-18 DIAGNOSIS — Z7901 Long term (current) use of anticoagulants: Secondary | ICD-10-CM

## 2012-07-18 DIAGNOSIS — I359 Nonrheumatic aortic valve disorder, unspecified: Secondary | ICD-10-CM

## 2012-07-18 DIAGNOSIS — Z952 Presence of prosthetic heart valve: Secondary | ICD-10-CM

## 2012-07-18 LAB — POCT INR: INR: 2.3

## 2012-08-16 ENCOUNTER — Ambulatory Visit (INDEPENDENT_AMBULATORY_CARE_PROVIDER_SITE_OTHER): Payer: Medicare Other | Admitting: *Deleted

## 2012-08-16 DIAGNOSIS — Z954 Presence of other heart-valve replacement: Secondary | ICD-10-CM

## 2012-08-16 DIAGNOSIS — I359 Nonrheumatic aortic valve disorder, unspecified: Secondary | ICD-10-CM

## 2012-08-16 DIAGNOSIS — Z952 Presence of prosthetic heart valve: Secondary | ICD-10-CM

## 2012-08-16 DIAGNOSIS — Z7901 Long term (current) use of anticoagulants: Secondary | ICD-10-CM

## 2012-08-16 LAB — POCT INR: INR: 2.9

## 2012-09-13 ENCOUNTER — Ambulatory Visit (INDEPENDENT_AMBULATORY_CARE_PROVIDER_SITE_OTHER): Payer: Medicare Other | Admitting: Pharmacist

## 2012-09-13 DIAGNOSIS — Z954 Presence of other heart-valve replacement: Secondary | ICD-10-CM

## 2012-09-13 DIAGNOSIS — I359 Nonrheumatic aortic valve disorder, unspecified: Secondary | ICD-10-CM

## 2012-09-13 DIAGNOSIS — Z7901 Long term (current) use of anticoagulants: Secondary | ICD-10-CM

## 2012-09-13 DIAGNOSIS — Z952 Presence of prosthetic heart valve: Secondary | ICD-10-CM

## 2012-10-09 ENCOUNTER — Ambulatory Visit (INDEPENDENT_AMBULATORY_CARE_PROVIDER_SITE_OTHER): Payer: Medicare Other | Admitting: *Deleted

## 2012-10-09 DIAGNOSIS — Z954 Presence of other heart-valve replacement: Secondary | ICD-10-CM

## 2012-10-09 DIAGNOSIS — I359 Nonrheumatic aortic valve disorder, unspecified: Secondary | ICD-10-CM

## 2012-10-09 DIAGNOSIS — Z952 Presence of prosthetic heart valve: Secondary | ICD-10-CM

## 2012-10-09 DIAGNOSIS — Z7901 Long term (current) use of anticoagulants: Secondary | ICD-10-CM

## 2012-10-09 LAB — POCT INR: INR: 4

## 2012-10-30 ENCOUNTER — Ambulatory Visit (INDEPENDENT_AMBULATORY_CARE_PROVIDER_SITE_OTHER): Payer: Medicare Other | Admitting: *Deleted

## 2012-10-30 DIAGNOSIS — Z954 Presence of other heart-valve replacement: Secondary | ICD-10-CM

## 2012-10-30 DIAGNOSIS — Z7901 Long term (current) use of anticoagulants: Secondary | ICD-10-CM

## 2012-10-30 DIAGNOSIS — Z952 Presence of prosthetic heart valve: Secondary | ICD-10-CM

## 2012-10-30 DIAGNOSIS — I359 Nonrheumatic aortic valve disorder, unspecified: Secondary | ICD-10-CM

## 2012-11-09 ENCOUNTER — Other Ambulatory Visit: Payer: Self-pay

## 2012-11-09 MED ORDER — WARFARIN SODIUM 5 MG PO TABS: ORAL_TABLET | ORAL | Status: DC

## 2012-11-21 ENCOUNTER — Ambulatory Visit (INDEPENDENT_AMBULATORY_CARE_PROVIDER_SITE_OTHER): Payer: Medicare Other | Admitting: *Deleted

## 2012-11-21 DIAGNOSIS — I359 Nonrheumatic aortic valve disorder, unspecified: Secondary | ICD-10-CM

## 2012-11-21 DIAGNOSIS — Z954 Presence of other heart-valve replacement: Secondary | ICD-10-CM

## 2012-11-21 DIAGNOSIS — Z952 Presence of prosthetic heart valve: Secondary | ICD-10-CM

## 2012-11-21 DIAGNOSIS — Z7901 Long term (current) use of anticoagulants: Secondary | ICD-10-CM

## 2012-12-20 ENCOUNTER — Ambulatory Visit (INDEPENDENT_AMBULATORY_CARE_PROVIDER_SITE_OTHER): Payer: Medicare Other | Admitting: *Deleted

## 2012-12-20 DIAGNOSIS — I359 Nonrheumatic aortic valve disorder, unspecified: Secondary | ICD-10-CM

## 2012-12-20 DIAGNOSIS — Z954 Presence of other heart-valve replacement: Secondary | ICD-10-CM

## 2012-12-20 DIAGNOSIS — Z952 Presence of prosthetic heart valve: Secondary | ICD-10-CM

## 2012-12-20 DIAGNOSIS — Z7901 Long term (current) use of anticoagulants: Secondary | ICD-10-CM

## 2013-01-17 ENCOUNTER — Ambulatory Visit (INDEPENDENT_AMBULATORY_CARE_PROVIDER_SITE_OTHER): Payer: Medicare Other | Admitting: Pharmacist

## 2013-01-17 DIAGNOSIS — Z952 Presence of prosthetic heart valve: Secondary | ICD-10-CM

## 2013-01-17 DIAGNOSIS — Z7901 Long term (current) use of anticoagulants: Secondary | ICD-10-CM

## 2013-01-17 DIAGNOSIS — I359 Nonrheumatic aortic valve disorder, unspecified: Secondary | ICD-10-CM

## 2013-01-17 DIAGNOSIS — Z954 Presence of other heart-valve replacement: Secondary | ICD-10-CM

## 2013-01-17 LAB — POCT INR: INR: 2.4

## 2013-01-19 ENCOUNTER — Other Ambulatory Visit: Payer: Self-pay | Admitting: *Deleted

## 2013-01-19 MED ORDER — METOPROLOL TARTRATE 50 MG PO TABS: 25.0000 mg | ORAL_TABLET | Freq: Every day | ORAL | Status: DC

## 2013-02-14 ENCOUNTER — Ambulatory Visit (INDEPENDENT_AMBULATORY_CARE_PROVIDER_SITE_OTHER): Payer: Medicare Other | Admitting: *Deleted

## 2013-02-14 DIAGNOSIS — Z7901 Long term (current) use of anticoagulants: Secondary | ICD-10-CM

## 2013-02-14 DIAGNOSIS — Z952 Presence of prosthetic heart valve: Secondary | ICD-10-CM

## 2013-02-14 DIAGNOSIS — I359 Nonrheumatic aortic valve disorder, unspecified: Secondary | ICD-10-CM

## 2013-02-14 DIAGNOSIS — Z954 Presence of other heart-valve replacement: Secondary | ICD-10-CM

## 2013-02-14 LAB — POCT INR: INR: 3.1

## 2013-03-13 ENCOUNTER — Encounter: Payer: Self-pay | Admitting: Cardiovascular Disease

## 2013-03-13 ENCOUNTER — Ambulatory Visit (INDEPENDENT_AMBULATORY_CARE_PROVIDER_SITE_OTHER): Payer: Medicare Other | Admitting: Cardiovascular Disease

## 2013-03-13 ENCOUNTER — Ambulatory Visit (INDEPENDENT_AMBULATORY_CARE_PROVIDER_SITE_OTHER): Payer: Medicare Other

## 2013-03-13 VITALS — BP 178/88 | HR 58 | Wt 155.0 lb

## 2013-03-13 DIAGNOSIS — IMO0002 Reserved for concepts with insufficient information to code with codable children: Secondary | ICD-10-CM

## 2013-03-13 DIAGNOSIS — Z954 Presence of other heart-valve replacement: Secondary | ICD-10-CM

## 2013-03-13 DIAGNOSIS — J449 Chronic obstructive pulmonary disease, unspecified: Secondary | ICD-10-CM

## 2013-03-13 DIAGNOSIS — S8011XS Contusion of right lower leg, sequela: Secondary | ICD-10-CM

## 2013-03-13 DIAGNOSIS — I359 Nonrheumatic aortic valve disorder, unspecified: Secondary | ICD-10-CM

## 2013-03-13 DIAGNOSIS — Z952 Presence of prosthetic heart valve: Secondary | ICD-10-CM

## 2013-03-13 DIAGNOSIS — I2510 Atherosclerotic heart disease of native coronary artery without angina pectoris: Secondary | ICD-10-CM

## 2013-03-13 DIAGNOSIS — Z7901 Long term (current) use of anticoagulants: Secondary | ICD-10-CM

## 2013-03-13 LAB — POCT INR: INR: 3.5

## 2013-03-13 NOTE — Assessment & Plan Note (Signed)
Stable with no angina and good activity level.  Continue medical Rx  

## 2013-03-13 NOTE — Assessment & Plan Note (Signed)
Normal valve exam with no AR  Continue f/u with coumadin clinic

## 2013-03-13 NOTE — Patient Instructions (Addendum)
Your physician wants you to follow-up in:  6 MONTHS WITH DR NISHAN  You will receive a reminder letter in the mail two months in advance. If you don't receive a letter, please call our office to schedule the follow-up appointment. Your physician recommends that you continue on your current medications as directed. Please refer to the Current Medication list given to you today. 

## 2013-03-13 NOTE — Assessment & Plan Note (Signed)
ILD exam with fine crackles F/U pulmonary

## 2013-03-13 NOTE — Assessment & Plan Note (Signed)
Resovled try to run INR 2.5 to 3.0

## 2013-03-13 NOTE — Progress Notes (Signed)
Patient ID: [name], male   DOB: 05/08/1930, 79 y.o.   MRN: [id number] [name] is seen today in F/U for dyspnea, AVR, CAD and anticoagulaiton. He had a St Jude AVR and SVG to RCA by Dr. Laneta Simmers in 2001. He has done well. He has had progressive dyspnea from previous smoking and interstitial lung disease. he sees Dr. Sherene Sires and has had his inhalers changed. He has not had any SSCP, palptitaitons, PND or orthopnea. His dypnsea is significant but improved. he is not coughing and has no hemoptysis. [name] follows his coumadin and he has been sub Rx the last few visits but around 2.2-2.4 without bleeding diathesis. HIs last echo on 01/2009 showed normal LV function and normal AVR with mean gradient and peak gradient of 22 mmHg.  He has pain in the thighs with ambulation which is not claudication as his pulses are excellent. Should F/U with primary regarding hips. SOB from interstitial fibrosis is stable  Needs flu shot and F/U with pulmonary for CXR.   Recent hospitalization for hematoma on foot with INR over 4  No better  ROS: Denies fever, malais, weight loss, blurry vision, decreased visual acuity, cough, sputum, SOB, hemoptysis, pleuritic pain, palpitaitons, heartburn, abdominal pain, melena, lower extremity edema, claudication, or rash.  All other systems reviewed and negative  General: Affect appropriate Chronically ill male Very hard of hearing HEENT: normal Neck supple with no adenopathy JVP normal no bruits no thyromegaly Lungs ILD with basilar crackles  no wheezing and good diaphragmatic motion Heart:  S1/S2 click SEM no AR  murmur, no rub, gallop or click PMI normal Abdomen: benighn, BS positve, no tenderness, no AAA no bruit.  No HSM or HJR Distal pulses intact with no bruits No edema Neuro non-focal Skin warm and dry No muscular weakness   Current Outpatient Prescriptions  Medication Sig Dispense Refill  . acetaminophen (TYLENOL) 500 MG tablet Take 500 mg by mouth  every 6 (six) hours as needed. For pain       . aspirin EC 81 MG tablet Take 81 mg by mouth daily.      Marland Kitchen atorvastatin (LIPITOR) 10 MG tablet Take 10 mg by mouth daily.        . brinzolamide (AZOPT) 1 % ophthalmic suspension Place 1 drop into both eyes 2 (two) times daily.       . cholecalciferol (VITAMIN D) 1000 UNITS tablet Take 1,000 Units by mouth daily.      . furosemide (LASIX) 20 MG tablet Take 20 mg by mouth as needed.       Marland Kitchen LUMIGAN 0.01 % SOLN Place 1 drop into both eyes At bedtime.      . metoprolol (LOPRESSOR) 50 MG tablet Take 0.5 tablets (25 mg total) by mouth daily.  30 tablet  12  . omeprazole (PRILOSEC) 20 MG capsule Take 20 mg by mouth daily.      Marland Kitchen tiotropium (SPIRIVA) 18 MCG inhalation capsule Place 18 mcg into inhaler and inhale daily.        Marland Kitchen warfarin (COUMADIN) 5 MG tablet Take as directed by anticoagulation clinic  45 tablet  3   No current facility-administered medications for this visit.    Allergies  Penicillins  Electrocardiogram:   SR rate 58 sinus arrhythmia otherwise normal  Assessment and Plan

## 2013-03-22 IMAGING — CR DG ABDOMEN 1V
1 series · 1 of 1 positions shown · non-contrast
Comparison: 01/28/2012

CLINICAL DATA: Small bowel obstruction.

ABDOMEN - 1 VIEW

[ap (kub)]
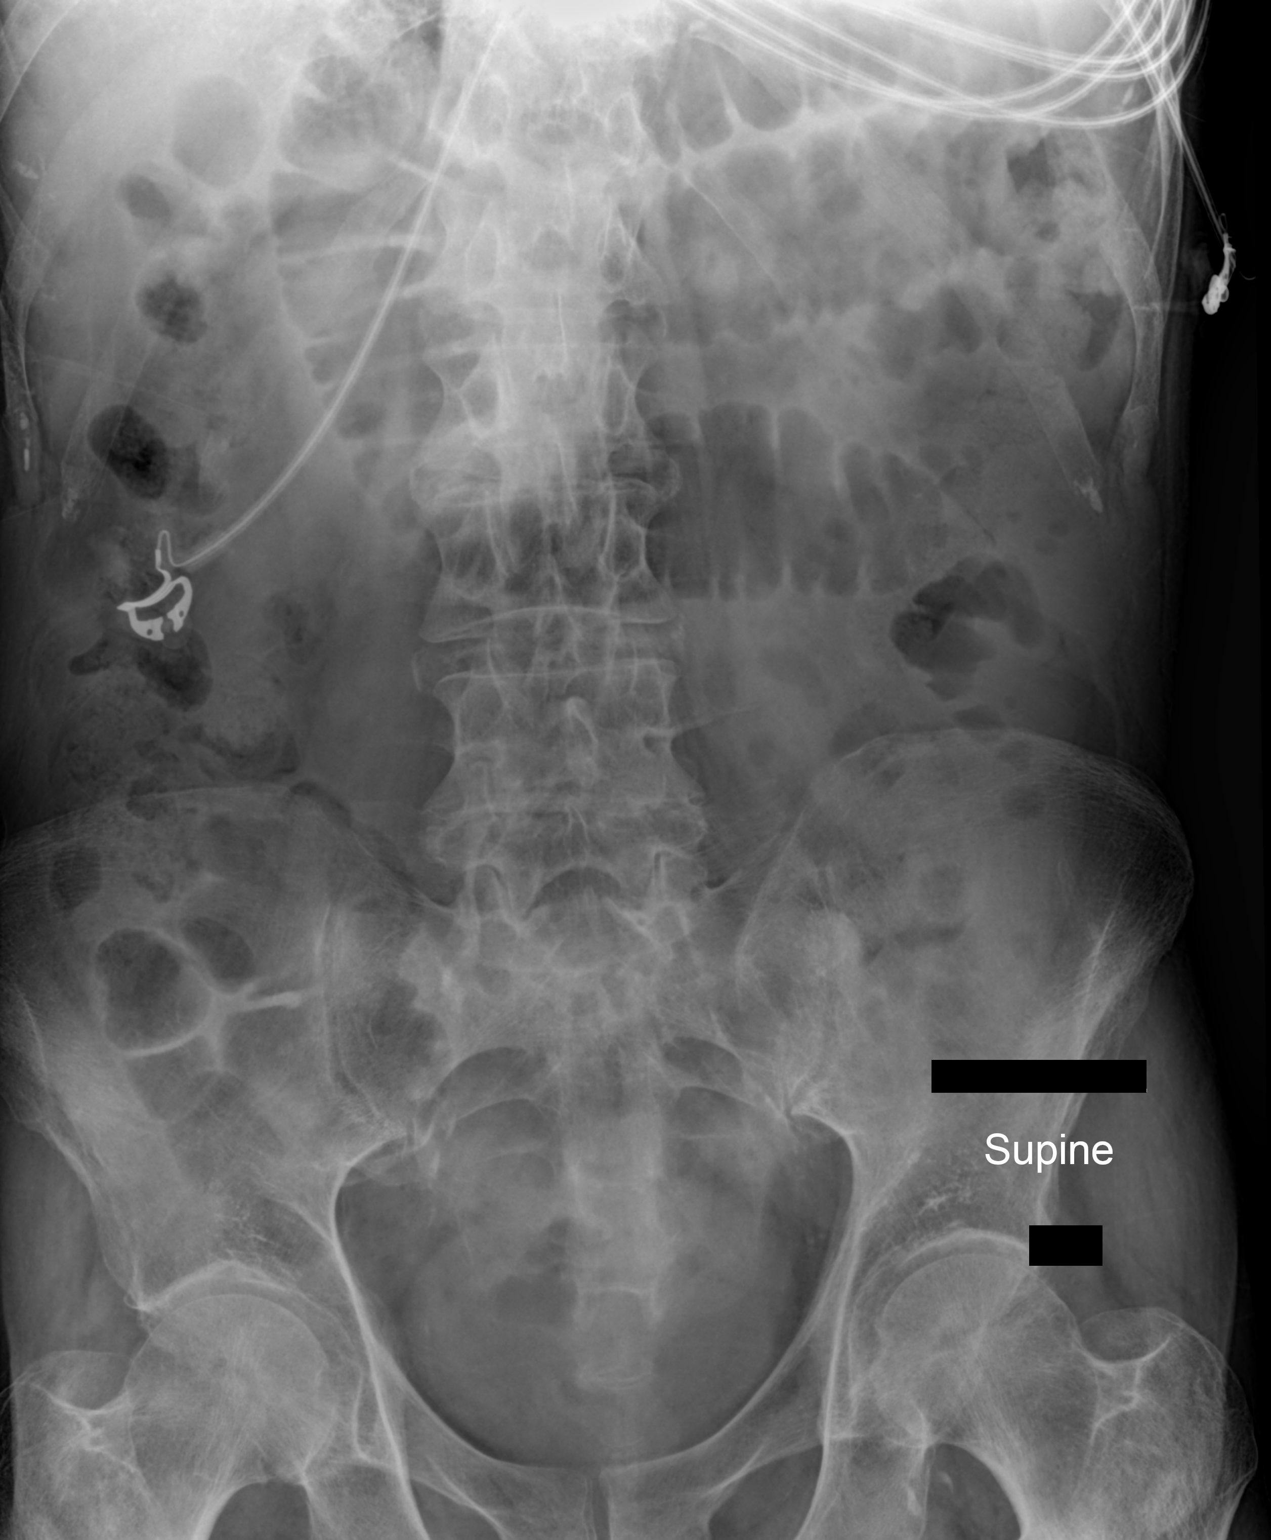

[1 of 1 positions shown; findings below may reference images not displayed]

FINDINGS: Supine view of the abdomen demonstrates persistent
dilatation of small bowel loops.  There is gas in nondilated loops
of large bowel.  No evidence for free intraperitoneal air on this
supine view.
IMPRESSION: Persistent dilatation of small bowel loops.

## 2013-04-07 IMAGING — CR DG ANKLE COMPLETE 3+V*R*
3 series · 3 of 3 positions shown · non-contrast
Comparison: None.

CLINICAL DATA: Right ankle bruising

RIGHT ANKLE - COMPLETE 3+ VIEW

[t ankle joint ap right]
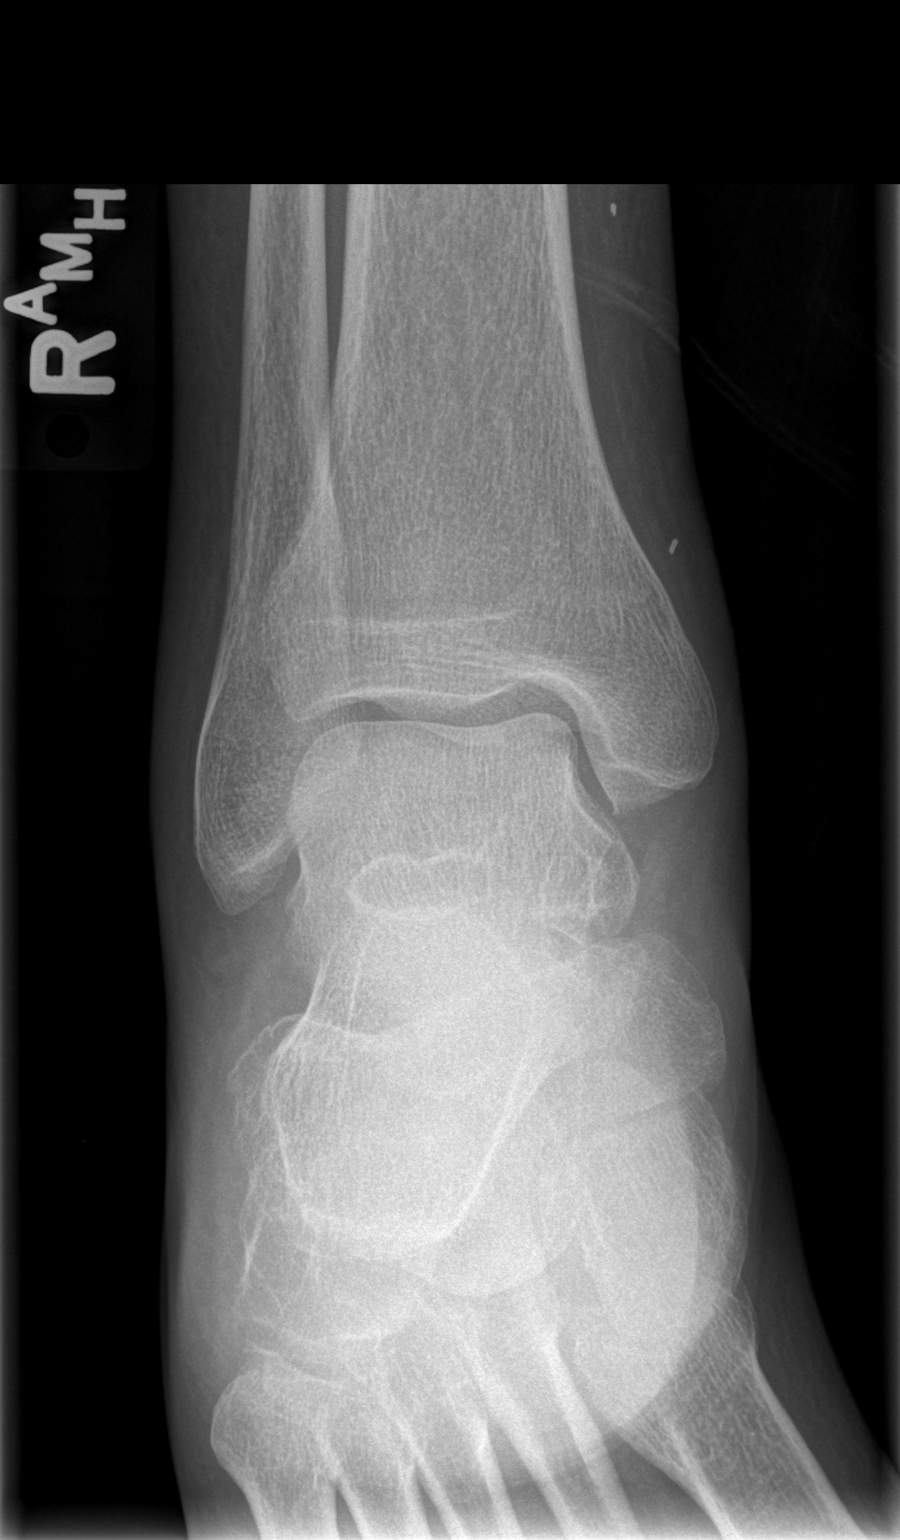

[t ankle joint lat right (1 of 2)]
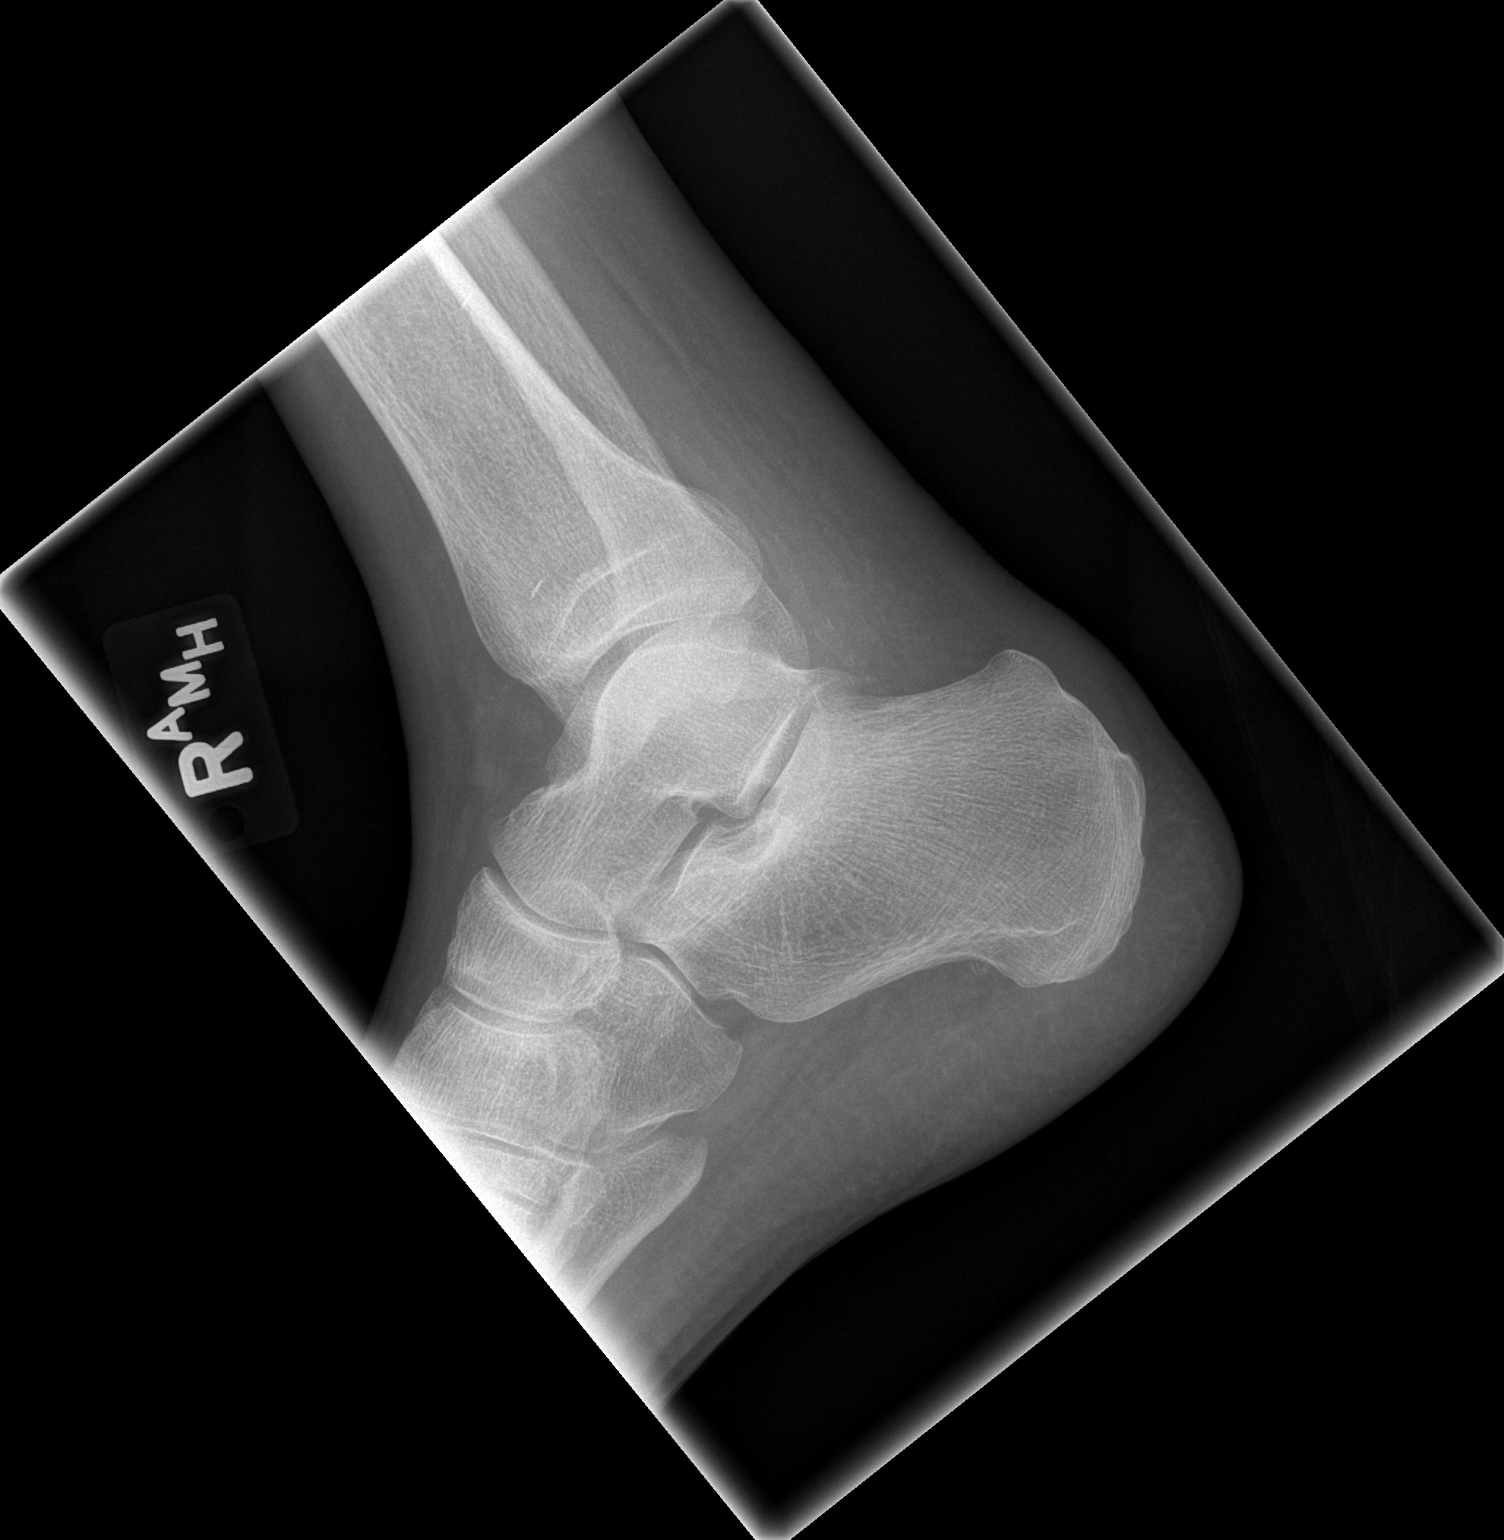

[t ankle joint lat right (2 of 2)]
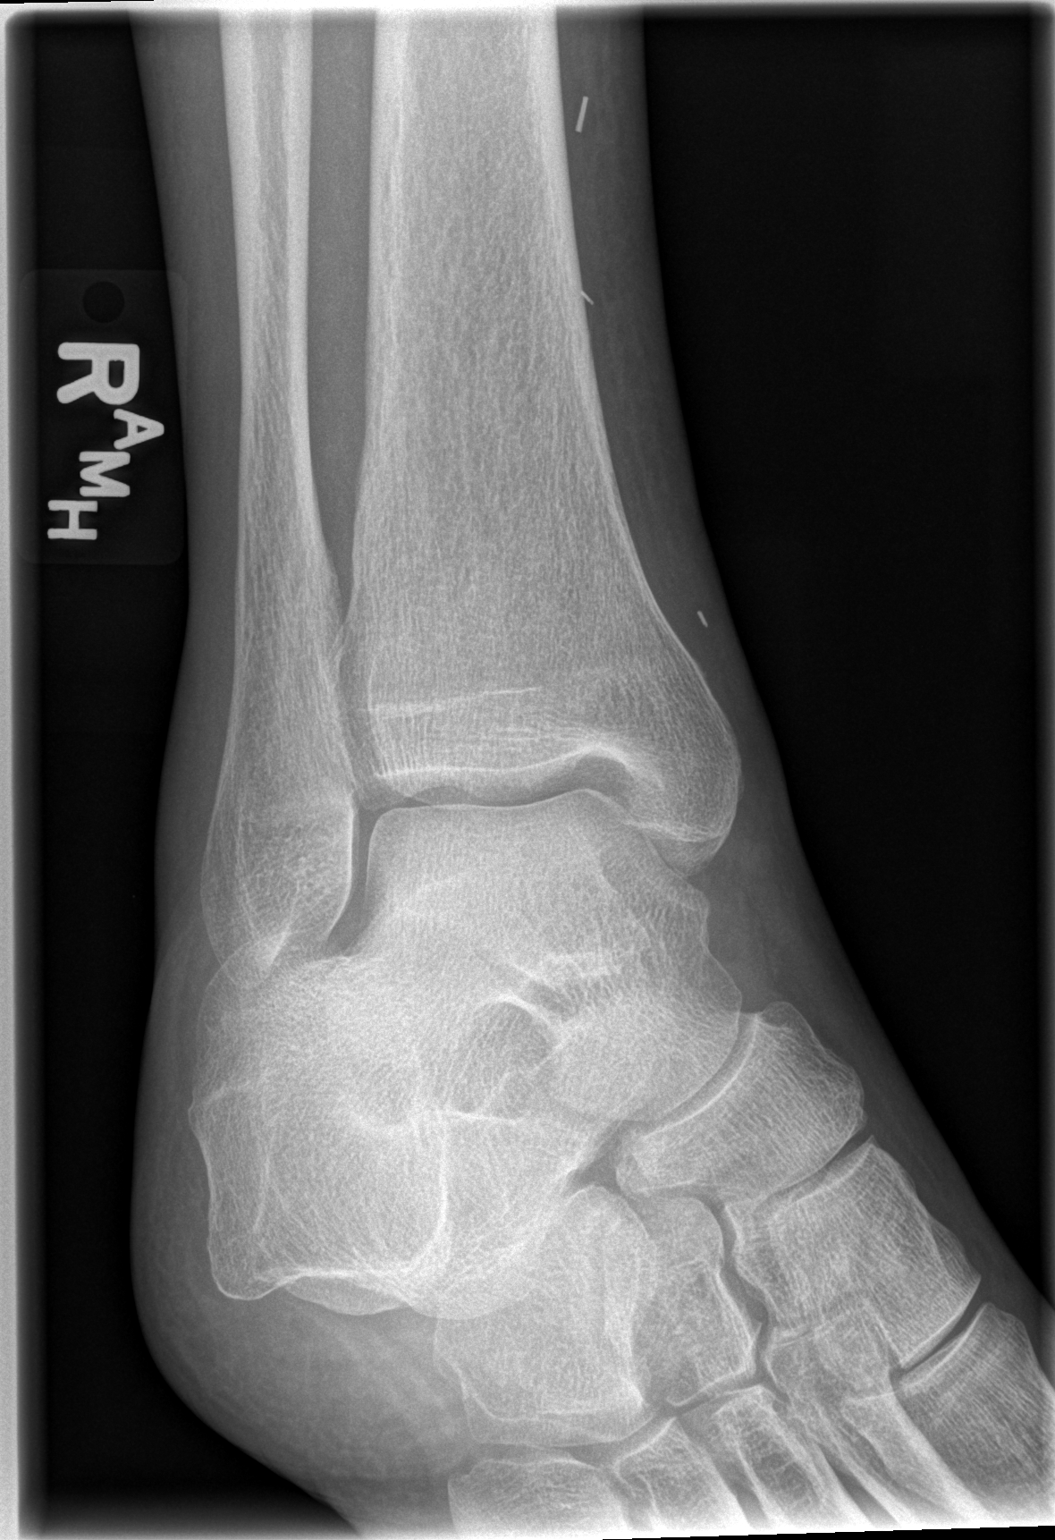

[3 of 3 positions shown; findings below may reference images not displayed]

FINDINGS: The ankle mortise intact.  Talar dome is normal.  No
malleolar fracture.  No joint effusion.
IMPRESSION: No acute osseous abnormality.

## 2013-04-07 IMAGING — CR DG FOOT COMPLETE 3+V*R*
3 series · 3 of 3 positions shown · non-contrast
Comparison: none

CLINICAL DATA: Right foot bruising

RIGHT FOOT COMPLETE - 3+ VIEW

[t foot ap right]
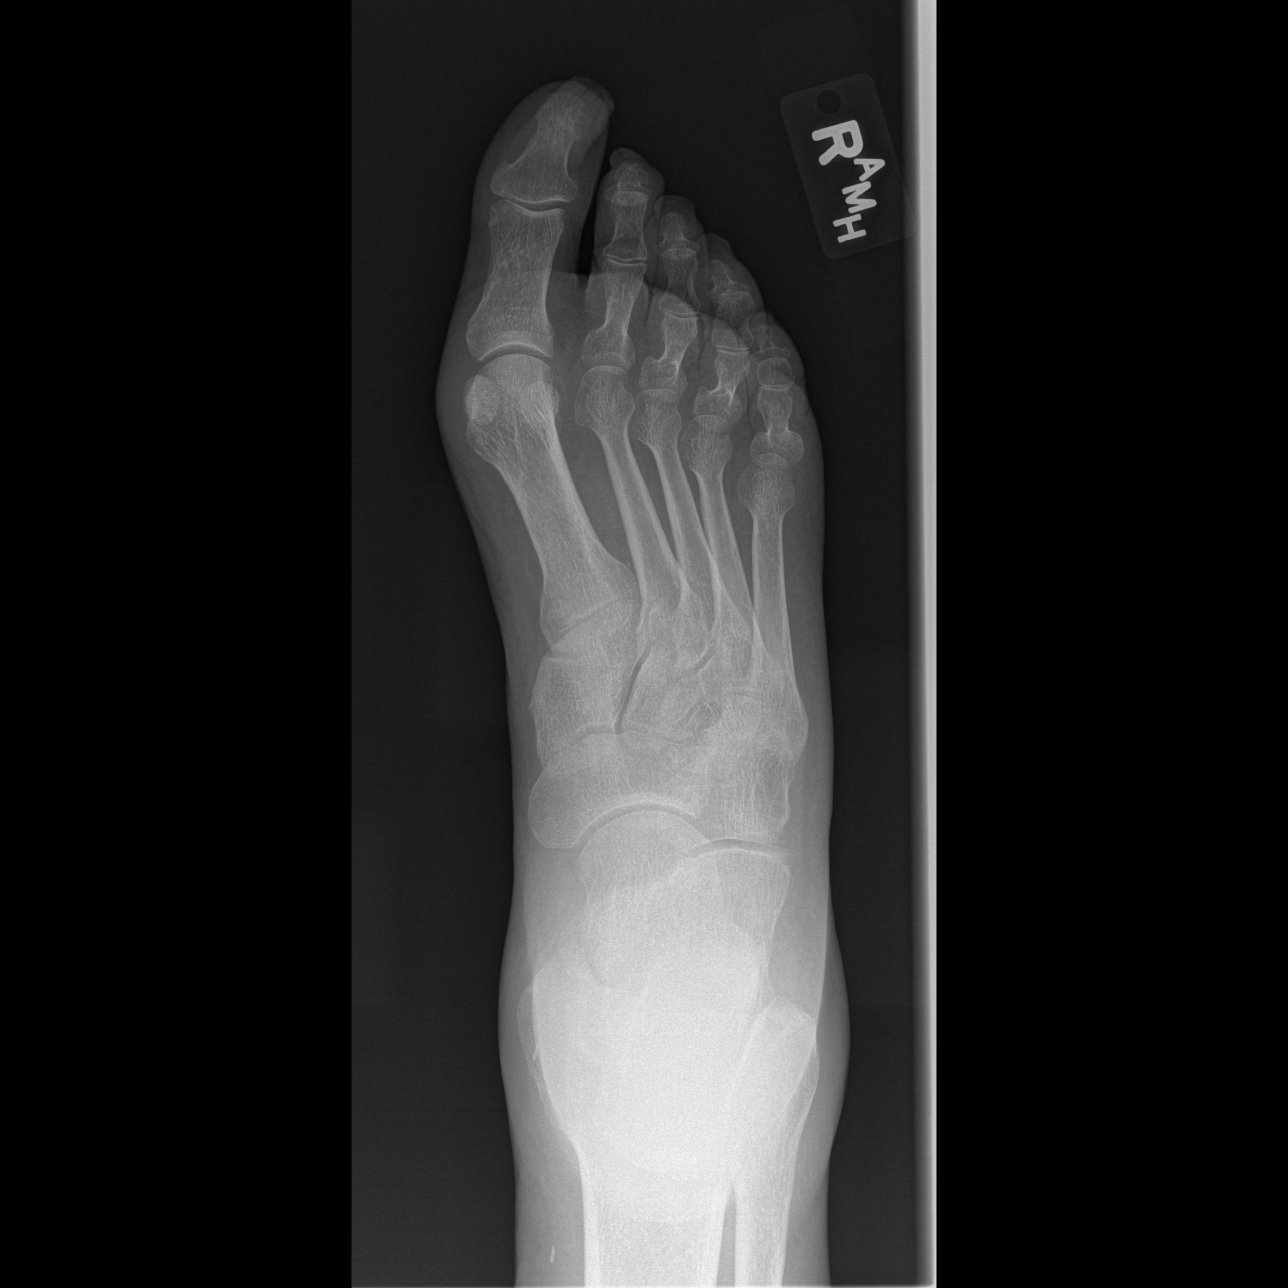

[t foot oblique right]
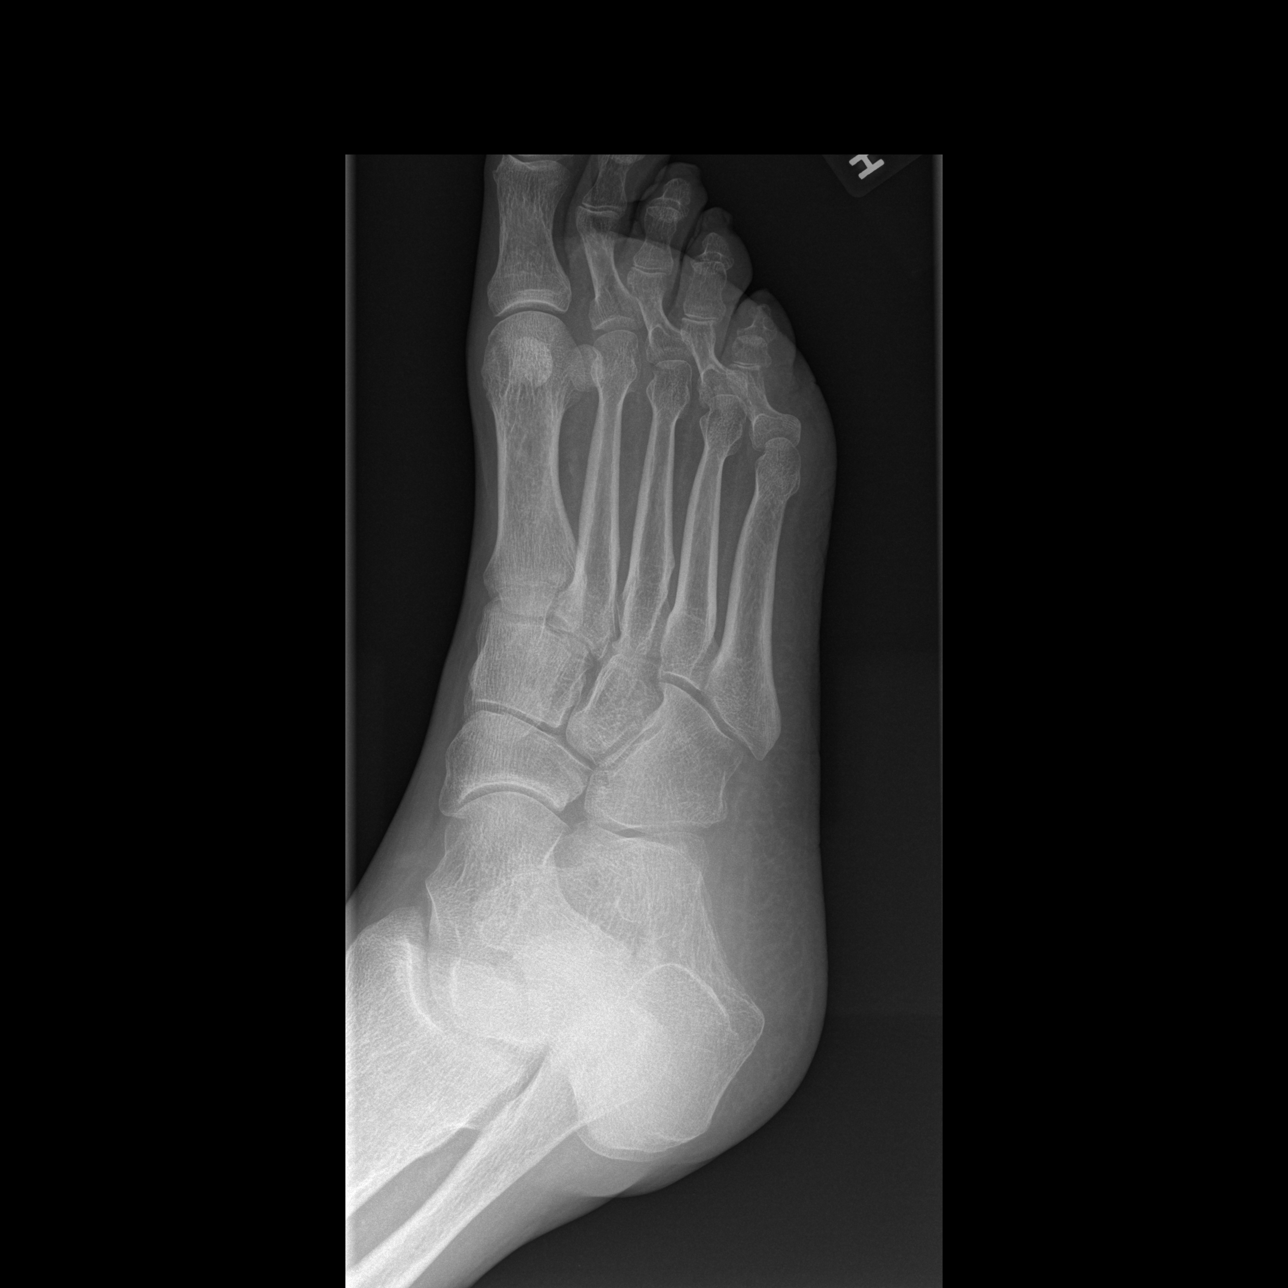

[t foot lat right]
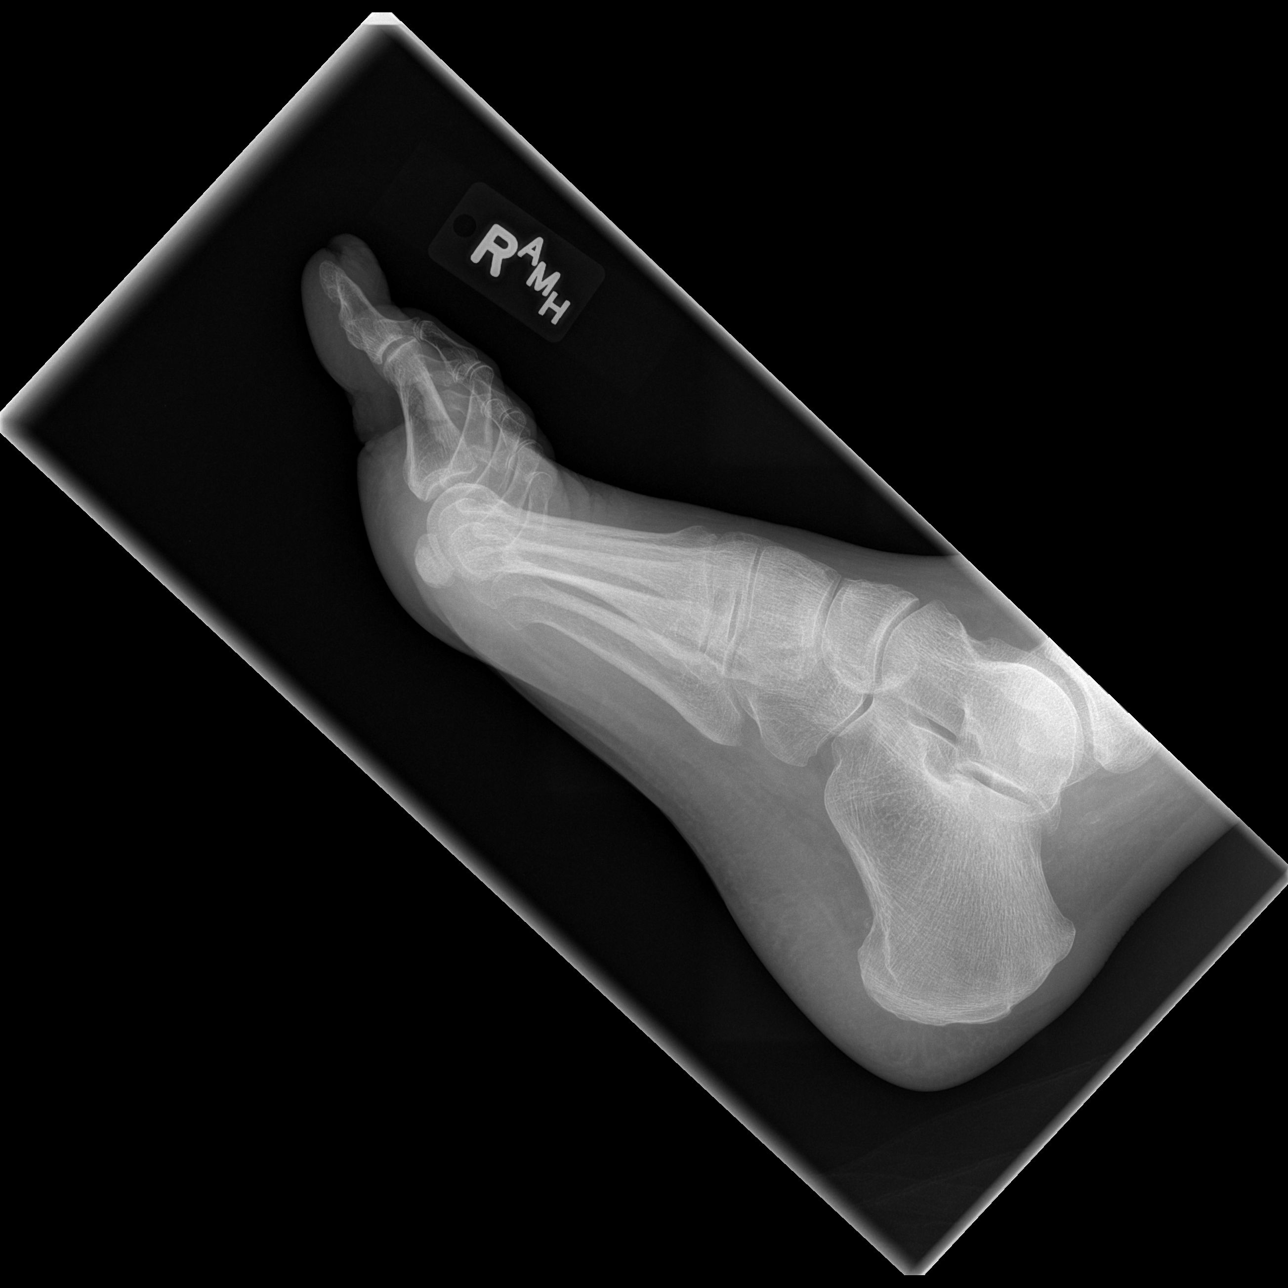

[3 of 3 positions shown; findings below may reference images not displayed]

FINDINGS: No fracture or dislocation of the right foot.  No soft
tissue abnormality.
IMPRESSION: No acute osseous abnormality.

## 2013-04-07 IMAGING — CT CT FEMUR *R* W/O CM
2 of 4 series · 8 of 14 positions shown, 9 images · non-contrast
Comparison: None.

CLINICAL DATA: Right leg pain and swelling.  Hematoma.

CT OF THE RIGHT FEMUR WITHOUT CONTRAST

[Series 2: lower ext · axial · 0.50mm/px · z∈[-702,-124]mm · 4 of 385 slices shown, 5 images (1 of 2)]
[im 77/385  soft-tissue]
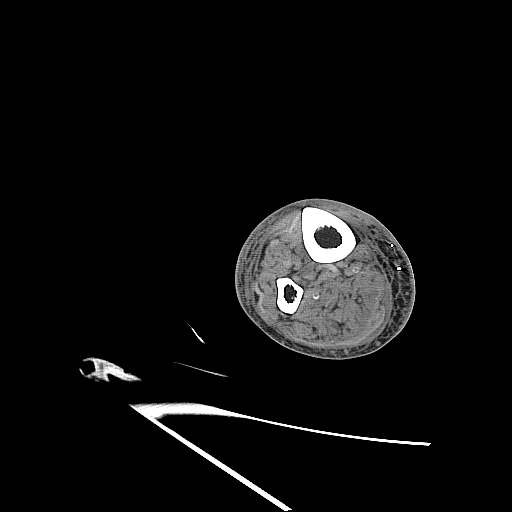
[im 77/385  bone]
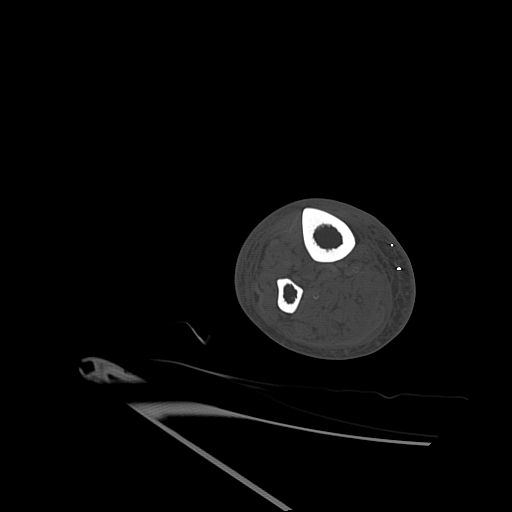
[im 154/385  bone]
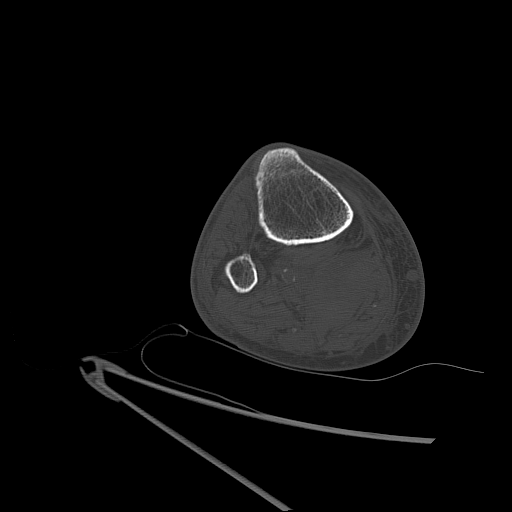
[im 231/385  bone]
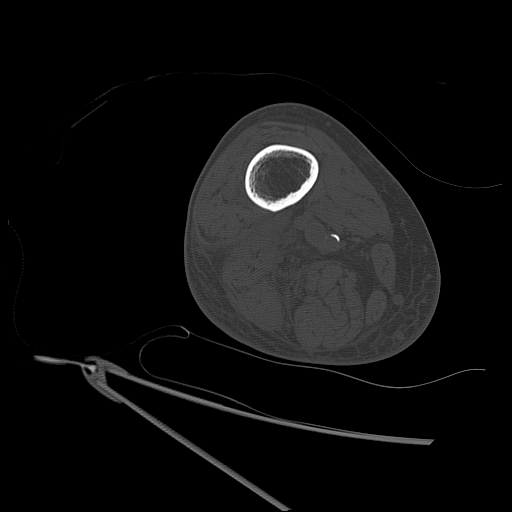
[im 308/385  bone]
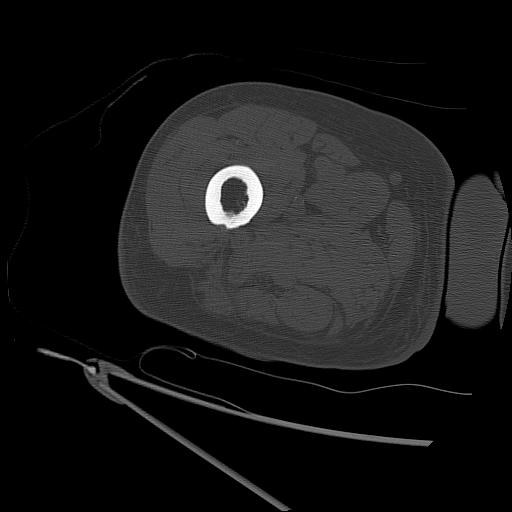

[Series 102: lower ext · axial · 0.50mm/px · z∈[-674,-142]mm · 4 of 355 slices shown (2 of 2)]
[im 71/355  bone]
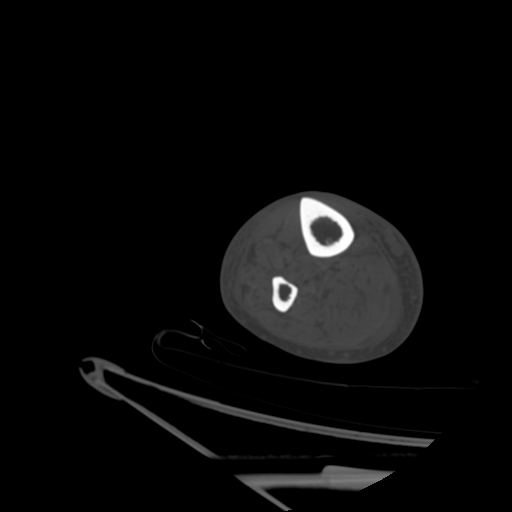
[im 142/355  bone]
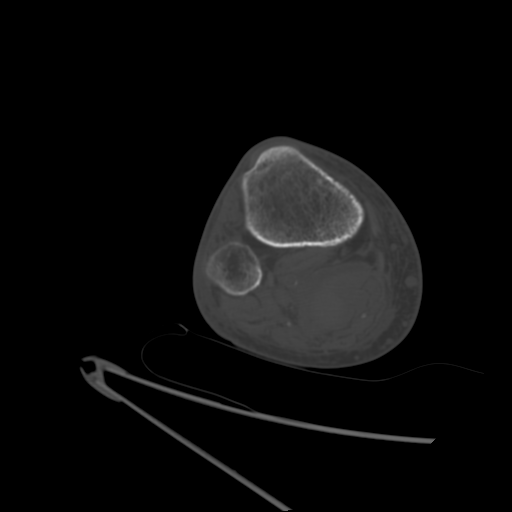
[im 213/355  bone]
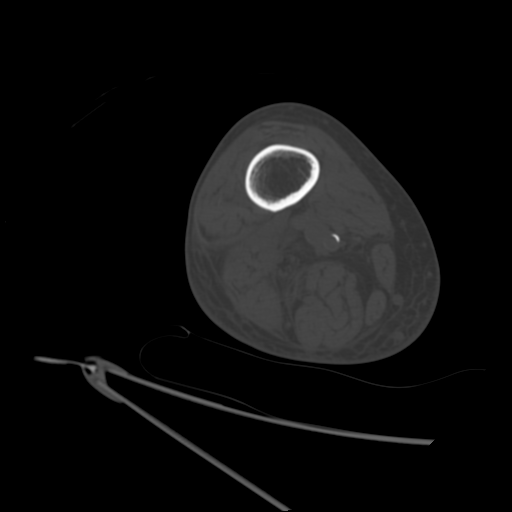
[im 284/355  bone]
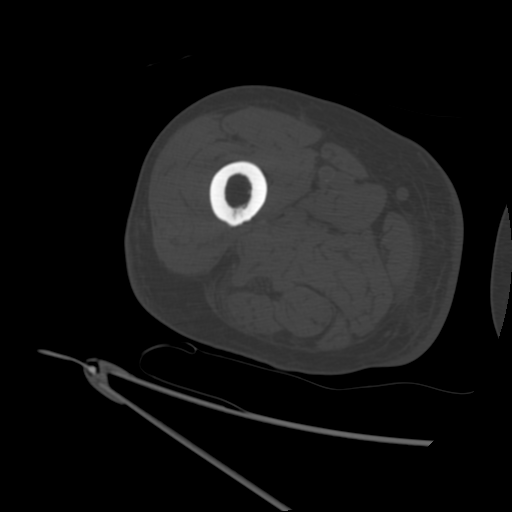

[8 of 14 positions shown; findings below may reference images not displayed]

FINDINGS: Partial visualization of visceral pelvis appears within
normal limits.  Atherosclerosis is present.  There is no hematoma
identified in the thigh.  Mild soft tissue stranding and
superficial fluid is present, which is nonspecific, most commonly
associated with edema.  The anterior and posterior muscular
compartments are within normal limits.  Adductor compartment
appears normal.  There is no fracture.  No osteolysis of the femur.
IMPRESSION: Mild nonspecific soft tissue stranding of the thigh most compatible
with edema.  No hematoma or focal fluid collection.

## 2013-04-10 ENCOUNTER — Ambulatory Visit (INDEPENDENT_AMBULATORY_CARE_PROVIDER_SITE_OTHER): Payer: Medicare Other | Admitting: *Deleted

## 2013-04-10 DIAGNOSIS — Z7901 Long term (current) use of anticoagulants: Secondary | ICD-10-CM

## 2013-04-10 DIAGNOSIS — Z952 Presence of prosthetic heart valve: Secondary | ICD-10-CM

## 2013-04-10 DIAGNOSIS — Z954 Presence of other heart-valve replacement: Secondary | ICD-10-CM

## 2013-04-10 DIAGNOSIS — I359 Nonrheumatic aortic valve disorder, unspecified: Secondary | ICD-10-CM

## 2013-05-11 ENCOUNTER — Other Ambulatory Visit: Payer: Self-pay | Admitting: *Deleted

## 2013-05-11 MED ORDER — WARFARIN SODIUM 5 MG PO TABS: ORAL_TABLET | ORAL | Status: DC

## 2013-05-11 NOTE — Telephone Encounter (Signed)
Patient came with wife to appointment and request refill for Warfarin. I let patient know I will give the request to Coumadin clinic for refill. He said that's great and thank you.   Kathelene Rumberger [employer]

## 2013-05-22 ENCOUNTER — Ambulatory Visit (INDEPENDENT_AMBULATORY_CARE_PROVIDER_SITE_OTHER): Payer: Medicare Other | Admitting: *Deleted

## 2013-05-22 DIAGNOSIS — Z954 Presence of other heart-valve replacement: Secondary | ICD-10-CM

## 2013-05-22 DIAGNOSIS — Z7901 Long term (current) use of anticoagulants: Secondary | ICD-10-CM

## 2013-05-22 DIAGNOSIS — Z952 Presence of prosthetic heart valve: Secondary | ICD-10-CM

## 2013-05-22 DIAGNOSIS — I359 Nonrheumatic aortic valve disorder, unspecified: Secondary | ICD-10-CM

## 2013-07-03 ENCOUNTER — Ambulatory Visit (INDEPENDENT_AMBULATORY_CARE_PROVIDER_SITE_OTHER): Payer: Medicare Other | Admitting: *Deleted

## 2013-07-03 ENCOUNTER — Encounter: Payer: Self-pay | Admitting: Physician Assistant

## 2013-07-03 DIAGNOSIS — Z954 Presence of other heart-valve replacement: Secondary | ICD-10-CM

## 2013-07-03 DIAGNOSIS — I359 Nonrheumatic aortic valve disorder, unspecified: Secondary | ICD-10-CM

## 2013-07-03 DIAGNOSIS — Z952 Presence of prosthetic heart valve: Secondary | ICD-10-CM

## 2013-07-03 DIAGNOSIS — Z7901 Long term (current) use of anticoagulants: Secondary | ICD-10-CM

## 2013-07-03 LAB — POCT INR: INR: 3.1

## 2013-08-14 ENCOUNTER — Ambulatory Visit (INDEPENDENT_AMBULATORY_CARE_PROVIDER_SITE_OTHER): Payer: Medicare Other | Admitting: *Deleted

## 2013-08-14 DIAGNOSIS — Z7901 Long term (current) use of anticoagulants: Secondary | ICD-10-CM

## 2013-08-14 DIAGNOSIS — I359 Nonrheumatic aortic valve disorder, unspecified: Secondary | ICD-10-CM

## 2013-08-14 DIAGNOSIS — Z952 Presence of prosthetic heart valve: Secondary | ICD-10-CM

## 2013-08-14 DIAGNOSIS — Z954 Presence of other heart-valve replacement: Secondary | ICD-10-CM

## 2013-08-29 ENCOUNTER — Ambulatory Visit (INDEPENDENT_AMBULATORY_CARE_PROVIDER_SITE_OTHER): Payer: Medicare Other | Admitting: *Deleted

## 2013-08-29 DIAGNOSIS — I359 Nonrheumatic aortic valve disorder, unspecified: Secondary | ICD-10-CM

## 2013-08-29 DIAGNOSIS — Z7901 Long term (current) use of anticoagulants: Secondary | ICD-10-CM

## 2013-08-29 DIAGNOSIS — Z954 Presence of other heart-valve replacement: Secondary | ICD-10-CM

## 2013-08-29 DIAGNOSIS — Z952 Presence of prosthetic heart valve: Secondary | ICD-10-CM

## 2013-09-13 ENCOUNTER — Encounter: Payer: Self-pay | Admitting: Physician Assistant

## 2013-09-13 ENCOUNTER — Ambulatory Visit (INDEPENDENT_AMBULATORY_CARE_PROVIDER_SITE_OTHER): Payer: Medicare Other | Admitting: Physician Assistant

## 2013-09-13 ENCOUNTER — Ambulatory Visit (INDEPENDENT_AMBULATORY_CARE_PROVIDER_SITE_OTHER): Payer: Medicare Other | Admitting: *Deleted

## 2013-09-13 VITALS — BP 150/88 | HR 60 | Ht 74.0 in | Wt 158.0 lb

## 2013-09-13 DIAGNOSIS — I359 Nonrheumatic aortic valve disorder, unspecified: Secondary | ICD-10-CM

## 2013-09-13 DIAGNOSIS — Z952 Presence of prosthetic heart valve: Secondary | ICD-10-CM

## 2013-09-13 DIAGNOSIS — E785 Hyperlipidemia, unspecified: Secondary | ICD-10-CM

## 2013-09-13 DIAGNOSIS — Z954 Presence of other heart-valve replacement: Secondary | ICD-10-CM

## 2013-09-13 DIAGNOSIS — J4489 Other specified chronic obstructive pulmonary disease: Secondary | ICD-10-CM

## 2013-09-13 DIAGNOSIS — I2510 Atherosclerotic heart disease of native coronary artery without angina pectoris: Secondary | ICD-10-CM

## 2013-09-13 DIAGNOSIS — I10 Essential (primary) hypertension: Secondary | ICD-10-CM

## 2013-09-13 DIAGNOSIS — Z7901 Long term (current) use of anticoagulants: Secondary | ICD-10-CM

## 2013-09-13 DIAGNOSIS — J449 Chronic obstructive pulmonary disease, unspecified: Secondary | ICD-10-CM

## 2013-09-13 LAB — POCT INR: INR: 3.6

## 2013-09-13 NOTE — Patient Instructions (Addendum)
Check your blood pressure once a day for 2 weeks.  Write this down and send it to me or Dr. Eden Emms.  Your physician recommends that you schedule a follow-up appointment in: 6 MONTHS WITH DR Eden Emms

## 2013-09-13 NOTE — Progress Notes (Signed)
[address], [address] [place], [place]  [address] Phone: [phone] Fax:  [phone]  Date:  09/13/2013   ID:  [name], DOB 1930-07-16, MRN [id number]  PCP:  [name], MD  Cardiologist:  Dr. Charlton Haws     History of Present Illness: [name] is a 79 y.o. male with a hx of CAD, aortic stenosis, s/p mechanical St Jude AVR + 1v CABG (S-RCA) in 2001, ILD, coumadin rx.  Myoview (12/06):  Normal, EF 70%.  Echo (02/2012):  EF 60-65%, mechanical AVR ok with mean gradient 11 mmHg.  Last seen by Dr. Charlton Haws in 03/2013.  He continues to note chronic DOE.  This is unchanged.  He describes NYHA Class 2b symptoms.  No orthopnea, PND, edema.  No chest pain or syncope.    Recent Labs: No results found for requested labs within last 365 days.  Wt Readings from Last 3 Encounters:  09/13/13 158 lb (71.668 kg)  03/13/13 155 lb (70.308 kg)  02/14/12 158 lb 11.7 oz (72 kg)     Past Medical History  Diagnosis Date  . CORONARY ATHEROSCLEROSIS NATIVE CORONARY ARTERY 04/23/2009  . DRY EYE SYNDROME 10/09/2008  . COPD 10/09/2008  . CAD, ARTERY BYPASS GRAFT 10/09/2008  . Aortic valve disorder 12/07/2010  . Palpitations   . Emphysema   . HOH (hard of hearing)     Current Outpatient Prescriptions  Medication Sig Dispense Refill  . acetaminophen (TYLENOL) 500 MG tablet Take 500 mg by mouth every 6 (six) hours as needed. For pain       . aspirin EC 81 MG tablet Take 81 mg by mouth daily.      Marland Kitchen atorvastatin (LIPITOR) 10 MG tablet Take 10 mg by mouth daily.        . brinzolamide (AZOPT) 1 % ophthalmic suspension Place 1 drop into both eyes 2 (two) times daily.       . cholecalciferol (VITAMIN D) 1000 UNITS tablet Take 1,000 Units by mouth daily.      . furosemide (LASIX) 20 MG tablet Take 20 mg by mouth as needed.       Marland Kitchen LUMIGAN 0.01 % SOLN Place 1 drop into both eyes At bedtime.      . metoprolol (LOPRESSOR) 50 MG tablet Take 0.5 tablets (25 mg total) by mouth  daily.  30 tablet  12  . omeprazole (PRILOSEC) 20 MG capsule Take 20 mg by mouth daily.      Marland Kitchen tiotropium (SPIRIVA) 18 MCG inhalation capsule Place 18 mcg into inhaler and inhale daily.        Marland Kitchen warfarin (COUMADIN) 5 MG tablet Take as directed by anticoagulation clinic  45 tablet  3   No current facility-administered medications for this visit.    Allergies:   Penicillins   Social History:  The patient  reports that he quit smoking about 13 years ago. His smoking use included Cigarettes. He has a 30 pack-year smoking history. He has never used smokeless tobacco. He reports that he does not drink alcohol or use illicit drugs.   Family History:  The patient's family history includes Cancer in an other family member; Colon cancer in his brother; Emphysema in his brother; Heart attack in his father; Hypertension in an other family member; Stroke in his mother.   ROS:  Please see the history of present illness.   All other systems reviewed and negative.   PHYSICAL EXAM: VS:  BP 150/88  Pulse 60  Ht 6\' 2"  (1.88 m)  Wt 158 lb (71.668 kg)  BMI 20.28 kg/m2 Well nourished, well developed, in no acute distress HEENT: normal Neck: no JVD Cardiac:  normal S1, mechanical S2; RRR; 2/6 systolic murmur along LLSB Lungs:  Decreased breath sounds bilaterally, no wheezing, rhonchi or rales Abd: soft, nontender, no hepatomegaly Ext: no edema Skin: warm and dry Neuro:  CNs 2-12 intact, no focal abnormalities noted  EKG:  NSR, HR 60, normal axis, NSSTTW changes     ASSESSMENT AND PLAN:  1. Aortic Stenosis, s/p St. Jude AVR:  Doing well.  AVR ok by echo in 2013.  Systolic murmur heard on exam described in the past.  He is tolerating coumadin.  This is managed in the CVRR clinic.   2. Hypertension:  BP elevated. He only takes metoprolol QD.  I suspect this is due to hx of bradycardia by his description.  I have asked him to monitor his BP for 2 weeks.  If BP remains above target, consider adding  Amlodipine 2.5 QD.   3. Hyperlipidemia:  Continue statin.  Request labs from PCP. 4. CAD:  No angina.  Continue ASA, statin.   5. Disposition:  F/u with Dr. Charlton Haws in 6 mos.   Signed, Tereso Newcomer, PA-C  09/13/2013 12:36 PM

## 2013-09-24 ENCOUNTER — Other Ambulatory Visit: Payer: Self-pay | Admitting: Cardiovascular Disease

## 2013-09-27 ENCOUNTER — Ambulatory Visit (INDEPENDENT_AMBULATORY_CARE_PROVIDER_SITE_OTHER): Payer: Medicare Other | Admitting: Pharmacist

## 2013-09-27 DIAGNOSIS — I359 Nonrheumatic aortic valve disorder, unspecified: Secondary | ICD-10-CM

## 2013-09-27 DIAGNOSIS — Z954 Presence of other heart-valve replacement: Secondary | ICD-10-CM

## 2013-09-27 DIAGNOSIS — Z952 Presence of prosthetic heart valve: Secondary | ICD-10-CM

## 2013-09-27 DIAGNOSIS — Z7901 Long term (current) use of anticoagulants: Secondary | ICD-10-CM

## 2013-09-28 ENCOUNTER — Telehealth: Payer: Self-pay | Admitting: Physician Assistant

## 2013-09-28 ENCOUNTER — Telehealth: Payer: Self-pay | Admitting: *Deleted

## 2013-09-28 NOTE — Telephone Encounter (Signed)
I have reviewed BP readings from patient.  Most readings are at target. There are a few that are high. Have him watch his salt intake. Continue to monitor and let us know if BPs consistent > 150/90. Tereso Newcomer, PA-C   09/28/2013 1:49 PM

## 2013-09-28 NOTE — Telephone Encounter (Signed)
I will try Monday to reach pt about BP readings and advise per Bing Neighbors. PA to watch na intake.

## 2013-09-28 NOTE — Telephone Encounter (Signed)
I will try Monday to reach pt about BP readings and advise per Scott W. PA to watch na intake. 

## 2013-10-01 NOTE — Telephone Encounter (Signed)
No answer, I did lvm with pt's son to have pt cb about his BP readings and recommendation per Bing Neighbors. PA

## 2013-10-02 ENCOUNTER — Telehealth: Payer: Self-pay | Admitting: Cardiovascular Disease

## 2013-10-02 NOTE — Telephone Encounter (Signed)
Spoke with pt's wife and gave her information from [place], [place] regarding blood pressure and limiting salt intake.(see phone note dated 12/19).  Pt will be here for coumadin clinic appt on October 18, 2013 and bring blood pressure readings to this appt.  Wife will call if blood pressures elevated prior to this appt

## 2013-10-02 NOTE — Telephone Encounter (Signed)
Spoke with pt's wife and gave her instructions from [place], [place]. (see phone note dated 12/23)

## 2013-10-02 NOTE — Telephone Encounter (Signed)
Follow Up  Pt wife returned call to receive results for husband.. Please call

## 2013-10-17 ENCOUNTER — Ambulatory Visit (INDEPENDENT_AMBULATORY_CARE_PROVIDER_SITE_OTHER): Payer: Medicare Other | Admitting: Pharmacist

## 2013-10-17 DIAGNOSIS — Z954 Presence of other heart-valve replacement: Secondary | ICD-10-CM

## 2013-10-17 DIAGNOSIS — Z952 Presence of prosthetic heart valve: Secondary | ICD-10-CM

## 2013-10-17 DIAGNOSIS — I359 Nonrheumatic aortic valve disorder, unspecified: Secondary | ICD-10-CM

## 2013-10-17 DIAGNOSIS — Z7901 Long term (current) use of anticoagulants: Secondary | ICD-10-CM

## 2013-10-17 LAB — POCT INR: INR: 2.4

## 2013-11-14 ENCOUNTER — Other Ambulatory Visit: Payer: Self-pay | Admitting: Dermatology

## 2013-11-14 ENCOUNTER — Ambulatory Visit (INDEPENDENT_AMBULATORY_CARE_PROVIDER_SITE_OTHER): Payer: Medicare Other | Admitting: Pharmacist

## 2013-11-14 DIAGNOSIS — Z954 Presence of other heart-valve replacement: Secondary | ICD-10-CM

## 2013-11-14 DIAGNOSIS — Z7901 Long term (current) use of anticoagulants: Secondary | ICD-10-CM

## 2013-11-14 DIAGNOSIS — Z5181 Encounter for therapeutic drug level monitoring: Secondary | ICD-10-CM | POA: Insufficient documentation

## 2013-11-14 DIAGNOSIS — Z952 Presence of prosthetic heart valve: Secondary | ICD-10-CM

## 2013-11-14 DIAGNOSIS — I359 Nonrheumatic aortic valve disorder, unspecified: Secondary | ICD-10-CM

## 2013-11-14 LAB — POCT INR: INR: 3.2

## 2013-12-13 ENCOUNTER — Other Ambulatory Visit: Payer: Self-pay | Admitting: Dermatology

## 2013-12-19 ENCOUNTER — Ambulatory Visit (INDEPENDENT_AMBULATORY_CARE_PROVIDER_SITE_OTHER): Payer: Medicare Other | Admitting: *Deleted

## 2013-12-19 DIAGNOSIS — Z952 Presence of prosthetic heart valve: Secondary | ICD-10-CM

## 2013-12-19 DIAGNOSIS — Z5181 Encounter for therapeutic drug level monitoring: Secondary | ICD-10-CM

## 2013-12-19 DIAGNOSIS — I359 Nonrheumatic aortic valve disorder, unspecified: Secondary | ICD-10-CM

## 2013-12-19 DIAGNOSIS — Z7901 Long term (current) use of anticoagulants: Secondary | ICD-10-CM

## 2013-12-19 DIAGNOSIS — Z954 Presence of other heart-valve replacement: Secondary | ICD-10-CM

## 2013-12-19 LAB — POCT INR: INR: 2.8

## 2014-01-23 ENCOUNTER — Ambulatory Visit (INDEPENDENT_AMBULATORY_CARE_PROVIDER_SITE_OTHER): Payer: Medicare Other | Admitting: *Deleted

## 2014-01-23 DIAGNOSIS — I359 Nonrheumatic aortic valve disorder, unspecified: Secondary | ICD-10-CM

## 2014-01-23 DIAGNOSIS — Z954 Presence of other heart-valve replacement: Secondary | ICD-10-CM

## 2014-01-23 DIAGNOSIS — Z5181 Encounter for therapeutic drug level monitoring: Secondary | ICD-10-CM

## 2014-01-23 DIAGNOSIS — Z952 Presence of prosthetic heart valve: Secondary | ICD-10-CM

## 2014-01-23 DIAGNOSIS — Z7901 Long term (current) use of anticoagulants: Secondary | ICD-10-CM

## 2014-01-23 LAB — POCT INR: INR: 2.7

## 2014-03-06 ENCOUNTER — Ambulatory Visit (INDEPENDENT_AMBULATORY_CARE_PROVIDER_SITE_OTHER): Payer: Medicare Other | Admitting: Surgery

## 2014-03-06 DIAGNOSIS — Z7901 Long term (current) use of anticoagulants: Secondary | ICD-10-CM

## 2014-03-06 DIAGNOSIS — Z954 Presence of other heart-valve replacement: Secondary | ICD-10-CM

## 2014-03-06 DIAGNOSIS — Z5181 Encounter for therapeutic drug level monitoring: Secondary | ICD-10-CM

## 2014-03-06 DIAGNOSIS — Z952 Presence of prosthetic heart valve: Secondary | ICD-10-CM

## 2014-03-06 DIAGNOSIS — I359 Nonrheumatic aortic valve disorder, unspecified: Secondary | ICD-10-CM

## 2014-03-06 LAB — POCT INR: INR: 3.7

## 2014-03-12 ENCOUNTER — Other Ambulatory Visit: Payer: Self-pay | Admitting: Cardiovascular Disease

## 2014-03-20 ENCOUNTER — Encounter (HOSPITAL_COMMUNITY): Payer: Self-pay | Admitting: Emergency Medicine

## 2014-03-20 ENCOUNTER — Emergency Department (HOSPITAL_COMMUNITY): Payer: Medicare Other

## 2014-03-20 ENCOUNTER — Inpatient Hospital Stay (HOSPITAL_COMMUNITY)
Admission: EM | Admit: 2014-03-20 | Discharge: 2014-03-23 | DRG: 193 | Disposition: A | Discharge status: Home / Self Care | Payer: Medicare Other | Attending: Internal Medicine | Admitting: Internal Medicine

## 2014-03-20 DIAGNOSIS — K56609 Unspecified intestinal obstruction, unspecified as to partial versus complete obstruction: Secondary | ICD-10-CM

## 2014-03-20 DIAGNOSIS — D649 Anemia, unspecified: Secondary | ICD-10-CM | POA: Diagnosis present

## 2014-03-20 DIAGNOSIS — Z954 Presence of other heart-valve replacement: Secondary | ICD-10-CM

## 2014-03-20 DIAGNOSIS — R112 Nausea with vomiting, unspecified: Secondary | ICD-10-CM

## 2014-03-20 DIAGNOSIS — Z7901 Long term (current) use of anticoagulants: Secondary | ICD-10-CM

## 2014-03-20 DIAGNOSIS — J189 Pneumonia, unspecified organism: Principal | ICD-10-CM | POA: Diagnosis present

## 2014-03-20 DIAGNOSIS — R0902 Hypoxemia: Secondary | ICD-10-CM

## 2014-03-20 DIAGNOSIS — Z800 Family history of malignant neoplasm of digestive organs: Secondary | ICD-10-CM

## 2014-03-20 DIAGNOSIS — Z8679 Personal history of other diseases of the circulatory system: Secondary | ICD-10-CM

## 2014-03-20 DIAGNOSIS — J690 Pneumonitis due to inhalation of food and vomit: Secondary | ICD-10-CM

## 2014-03-20 DIAGNOSIS — J441 Chronic obstructive pulmonary disease with (acute) exacerbation: Secondary | ICD-10-CM | POA: Diagnosis present

## 2014-03-20 DIAGNOSIS — Z87891 Personal history of nicotine dependence: Secondary | ICD-10-CM

## 2014-03-20 DIAGNOSIS — R0602 Shortness of breath: Secondary | ICD-10-CM

## 2014-03-20 DIAGNOSIS — R918 Other nonspecific abnormal finding of lung field: Secondary | ICD-10-CM

## 2014-03-20 DIAGNOSIS — R0609 Other forms of dyspnea: Secondary | ICD-10-CM

## 2014-03-20 DIAGNOSIS — J9600 Acute respiratory failure, unspecified whether with hypoxia or hypercapnia: Secondary | ICD-10-CM | POA: Diagnosis present

## 2014-03-20 DIAGNOSIS — R791 Abnormal coagulation profile: Secondary | ICD-10-CM | POA: Diagnosis present

## 2014-03-20 DIAGNOSIS — E785 Hyperlipidemia, unspecified: Secondary | ICD-10-CM

## 2014-03-20 DIAGNOSIS — Z951 Presence of aortocoronary bypass graft: Secondary | ICD-10-CM

## 2014-03-20 DIAGNOSIS — H04129 Dry eye syndrome of unspecified lacrimal gland: Secondary | ICD-10-CM

## 2014-03-20 DIAGNOSIS — I25810 Atherosclerosis of coronary artery bypass graft(s) without angina pectoris: Secondary | ICD-10-CM

## 2014-03-20 DIAGNOSIS — Z952 Presence of prosthetic heart valve: Secondary | ICD-10-CM

## 2014-03-20 DIAGNOSIS — E860 Dehydration: Secondary | ICD-10-CM

## 2014-03-20 DIAGNOSIS — R0989 Other specified symptoms and signs involving the circulatory and respiratory systems: Secondary | ICD-10-CM

## 2014-03-20 DIAGNOSIS — Z823 Family history of stroke: Secondary | ICD-10-CM

## 2014-03-20 DIAGNOSIS — I10 Essential (primary) hypertension: Secondary | ICD-10-CM

## 2014-03-20 DIAGNOSIS — I2510 Atherosclerotic heart disease of native coronary artery without angina pectoris: Secondary | ICD-10-CM | POA: Diagnosis present

## 2014-03-20 DIAGNOSIS — E278 Other specified disorders of adrenal gland: Secondary | ICD-10-CM

## 2014-03-20 DIAGNOSIS — Z8249 Family history of ischemic heart disease and other diseases of the circulatory system: Secondary | ICD-10-CM

## 2014-03-20 DIAGNOSIS — K5641 Fecal impaction: Secondary | ICD-10-CM

## 2014-03-20 DIAGNOSIS — Z5181 Encounter for therapeutic drug level monitoring: Secondary | ICD-10-CM

## 2014-03-20 DIAGNOSIS — I359 Nonrheumatic aortic valve disorder, unspecified: Secondary | ICD-10-CM

## 2014-03-20 DIAGNOSIS — T45515A Adverse effect of anticoagulants, initial encounter: Secondary | ICD-10-CM | POA: Diagnosis present

## 2014-03-20 DIAGNOSIS — J449 Chronic obstructive pulmonary disease, unspecified: Secondary | ICD-10-CM

## 2014-03-20 LAB — CBC WITH DIFFERENTIAL/PLATELET
Basophils Absolute: 0 10*3/uL (ref 0.0–0.1)
Basophils Relative: 0 % (ref 0–1)
EOS ABS: 0 10*3/uL (ref 0.0–0.7)
Eosinophils Relative: 0 % (ref 0–5)
HCT: 33.3 % — ABNORMAL LOW (ref 39.0–52.0)
Hemoglobin: 11.2 g/dL — ABNORMAL LOW (ref 13.0–17.0)
LYMPHS PCT: 4 % — AB (ref 12–46)
Lymphs Abs: 0.5 10*3/uL — ABNORMAL LOW (ref 0.7–4.0)
MCH: 30.3 pg (ref 26.0–34.0)
MCHC: 33.6 g/dL (ref 30.0–36.0)
MCV: 90 fL (ref 78.0–100.0)
Monocytes Absolute: 0.6 10*3/uL (ref 0.1–1.0)
Monocytes Relative: 5 % (ref 3–12)
NEUTROS PCT: 91 % — AB (ref 43–77)
Neutro Abs: 10.3 10*3/uL — ABNORMAL HIGH (ref 1.7–7.7)
Platelets: 200 10*3/uL (ref 150–400)
RBC: 3.7 MIL/uL — ABNORMAL LOW (ref 4.22–5.81)
RDW: 13.1 % (ref 11.5–15.5)
WBC: 11.4 10*3/uL — ABNORMAL HIGH (ref 4.0–10.5)

## 2014-03-20 LAB — COMPREHENSIVE METABOLIC PANEL
ALK PHOS: 159 U/L — AB (ref 39–117)
ALT: 12 U/L (ref 0–53)
AST: 16 U/L (ref 0–37)
Albumin: 2.7 g/dL — ABNORMAL LOW (ref 3.5–5.2)
BILIRUBIN TOTAL: 0.7 mg/dL (ref 0.3–1.2)
BUN: 19 mg/dL (ref 6–23)
CHLORIDE: 96 meq/L (ref 96–112)
CO2: 23 meq/L (ref 19–32)
Calcium: 8.9 mg/dL (ref 8.4–10.5)
Creatinine, Ser: 1.12 mg/dL (ref 0.50–1.35)
GFR calc non Af Amer: 60 mL/min — ABNORMAL LOW (ref >90)
GFR, EST AFRICAN AMERICAN: 69 mL/min — AB (ref >90)
GLUCOSE: 178 mg/dL — AB (ref 70–99)
POTASSIUM: 4.2 meq/L (ref 3.7–5.3)
SODIUM: 133 meq/L — AB (ref 137–147)
Total Protein: 7.3 g/dL (ref 6.0–8.3)

## 2014-03-20 LAB — PROTIME-INR
INR: 6.15 — AB (ref 0.00–1.49)
Prothrombin Time: 52 seconds — ABNORMAL HIGH (ref 11.6–15.2)

## 2014-03-20 LAB — URINALYSIS, ROUTINE W REFLEX MICROSCOPIC
GLUCOSE, UA: NEGATIVE mg/dL
HGB URINE DIPSTICK: NEGATIVE
Ketones, ur: 15 mg/dL — AB
Leukocytes, UA: NEGATIVE
Nitrite: NEGATIVE
PROTEIN: NEGATIVE mg/dL
Specific Gravity, Urine: 1.03 (ref 1.005–1.030)
Urobilinogen, UA: 2 mg/dL — ABNORMAL HIGH (ref 0.0–1.0)
pH: 5.5 (ref 5.0–8.0)

## 2014-03-20 LAB — D-DIMER, QUANTITATIVE: D-Dimer, Quant: 0.27 ug/mL-FEU (ref 0.00–0.48)

## 2014-03-20 LAB — TROPONIN I: Troponin I: <0.3 ng/mL (ref <0.30)

## 2014-03-20 LAB — PRO B NATRIURETIC PEPTIDE: Pro B Natriuretic peptide (BNP): 1766 pg/mL — ABNORMAL HIGH (ref 0–450)

## 2014-03-20 MED ORDER — VITAMIN D3 25 MCG (1000 UNIT) PO TABS
1000.0000 [IU] | ORAL_TABLET | Freq: Every day | ORAL | Status: DC
  Administered 2014-03-21 – 2014-03-23 (×3): 1000 [IU] via ORAL
  Filled 2014-03-20 (×3): qty 1

## 2014-03-20 MED ORDER — ALBUTEROL SULFATE (2.5 MG/3ML) 0.083% IN NEBU
2.5000 mg | INHALATION_SOLUTION | RESPIRATORY_TRACT | Status: DC | PRN
Start: 2014-03-20 — End: 2014-03-23

## 2014-03-20 MED ORDER — IPRATROPIUM BROMIDE 0.02 % IN SOLN
0.5000 mg | Freq: Once | RESPIRATORY_TRACT | Status: AC
  Administered 2014-03-20: 0.5 mg via RESPIRATORY_TRACT
  Filled 2014-03-20: qty 2.5

## 2014-03-20 MED ORDER — FUROSEMIDE 10 MG/ML IJ SOLN
20.0000 mg | Freq: Every day | INTRAMUSCULAR | Status: DC
  Administered 2014-03-21 (×2): 20 mg via INTRAVENOUS
  Filled 2014-03-20: qty 2

## 2014-03-20 MED ORDER — SODIUM CHLORIDE 0.9 % IJ SOLN
3.0000 mL | Freq: Two times a day (BID) | INTRAMUSCULAR | Status: DC
Start: 2014-03-20 — End: 2014-03-23
  Administered 2014-03-21 – 2014-03-22 (×4): 3 mL via INTRAVENOUS

## 2014-03-20 MED ORDER — ALBUTEROL SULFATE (2.5 MG/3ML) 0.083% IN NEBU
5.0000 mg | INHALATION_SOLUTION | Freq: Once | RESPIRATORY_TRACT | Status: AC
  Administered 2014-03-20: 5 mg via RESPIRATORY_TRACT
  Filled 2014-03-20: qty 6

## 2014-03-20 MED ORDER — ATORVASTATIN CALCIUM 10 MG PO TABS
10.0000 mg | ORAL_TABLET | Freq: Every day | ORAL | Status: DC
  Administered 2014-03-21 – 2014-03-23 (×3): 10 mg via ORAL
  Filled 2014-03-20 (×3): qty 1

## 2014-03-20 MED ORDER — IPRATROPIUM BROMIDE 0.02 % IN SOLN: 0.5000 mg | Freq: Four times a day (QID) | RESPIRATORY_TRACT | Status: DC

## 2014-03-20 MED ORDER — LATANOPROST 0.005 % OP SOLN
1.0000 [drp] | Freq: Every day | OPHTHALMIC | Status: DC
  Administered 2014-03-21 – 2014-03-22 (×3): 1 [drp] via OPHTHALMIC
  Filled 2014-03-20 (×3): qty 2.5

## 2014-03-20 MED ORDER — SODIUM CHLORIDE 0.9 % IJ SOLN
3.0000 mL | Freq: Two times a day (BID) | INTRAMUSCULAR | Status: DC
  Administered 2014-03-21 – 2014-03-23 (×4): 3 mL via INTRAVENOUS

## 2014-03-20 MED ORDER — ACETAMINOPHEN 325 MG PO TABS: 650.0000 mg | ORAL_TABLET | Freq: Four times a day (QID) | ORAL | Status: DC | PRN

## 2014-03-20 MED ORDER — ASPIRIN EC 81 MG PO TBEC
81.0000 mg | DELAYED_RELEASE_TABLET | Freq: Every day | ORAL | Status: DC
  Administered 2014-03-21 – 2014-03-23 (×3): 81 mg via ORAL
  Filled 2014-03-20 (×3): qty 1

## 2014-03-20 MED ORDER — ACETAMINOPHEN 650 MG RE SUPP: 650.0000 mg | Freq: Four times a day (QID) | RECTAL | Status: DC | PRN

## 2014-03-20 MED ORDER — METOPROLOL TARTRATE 25 MG PO TABS
25.0000 mg | ORAL_TABLET | Freq: Every day | ORAL | Status: DC
  Administered 2014-03-21 – 2014-03-23 (×3): 25 mg via ORAL
  Filled 2014-03-20 (×3): qty 1

## 2014-03-20 MED ORDER — ALBUTEROL SULFATE (2.5 MG/3ML) 0.083% IN NEBU
2.5000 mg | INHALATION_SOLUTION | RESPIRATORY_TRACT | Status: DC
  Administered 2014-03-21: 2.5 mg via RESPIRATORY_TRACT
  Filled 2014-03-20: qty 3

## 2014-03-20 MED ORDER — BRINZOLAMIDE 1 % OP SUSP
1.0000 [drp] | Freq: Two times a day (BID) | OPHTHALMIC | Status: DC
  Administered 2014-03-21 – 2014-03-23 (×6): 1 [drp] via OPHTHALMIC
  Filled 2014-03-20 (×2): qty 10

## 2014-03-20 MED ORDER — LEVOFLOXACIN IN D5W 750 MG/150ML IV SOLN
750.0000 mg | Freq: Once | INTRAVENOUS | Status: AC
  Administered 2014-03-20: 750 mg via INTRAVENOUS
  Filled 2014-03-20: qty 150

## 2014-03-20 MED ORDER — ONDANSETRON HCL 4 MG PO TABS: 4.0000 mg | ORAL_TABLET | Freq: Four times a day (QID) | ORAL | Status: DC | PRN

## 2014-03-20 MED ORDER — ONDANSETRON HCL 4 MG/2ML IJ SOLN: 4.0000 mg | Freq: Four times a day (QID) | INTRAMUSCULAR | Status: DC | PRN

## 2014-03-20 MED ORDER — BUDESONIDE 0.25 MG/2ML IN SUSP
0.2500 mg | Freq: Two times a day (BID) | RESPIRATORY_TRACT | Status: DC
  Administered 2014-03-21 – 2014-03-23 (×5): 0.25 mg via RESPIRATORY_TRACT
  Filled 2014-03-20 (×8): qty 2

## 2014-03-20 MED ORDER — PANTOPRAZOLE SODIUM 40 MG PO TBEC
40.0000 mg | DELAYED_RELEASE_TABLET | Freq: Every day | ORAL | Status: DC
  Administered 2014-03-21 – 2014-03-23 (×3): 40 mg via ORAL
  Filled 2014-03-20 (×3): qty 1

## 2014-03-20 NOTE — Progress Notes (Signed)
Called report, nurse in ED not available to give report at this time.

## 2014-03-20 NOTE — ED Provider Notes (Signed)
CSN: [id number]     Arrival date & time 03/20/14  1636 History   First MD Initiated Contact with Patient 03/20/14 1640     Chief Complaint  Patient presents with  . Shortness of Breath     (Consider location/radiation/quality/duration/timing/severity/associated sxs/prior Treatment) HPI Pt with hx of CAD, COPD presents with c/o feeling short of breath, generalized fatigue over the past 3-4 days.  No fever/chills.  No chest pain.  He went to see his doctor this morning and O2 sat was 80% prompting EMS brining him to ED for evaluation.  He also states he has been coughing up more sputum than usual.  No leg swelling. He states he feels improved after nebs via EMS.  He was also given solumedrol.  Pt does not use O2 at home.  There are no other associated systemic symptoms, there are no other alleviating or modifying factors.   Past Medical History  Diagnosis Date  . CORONARY ATHEROSCLEROSIS NATIVE CORONARY ARTERY 04/23/2009  . DRY EYE SYNDROME 10/09/2008  . COPD 10/09/2008  . CAD, ARTERY BYPASS GRAFT 10/09/2008  . Aortic valve disorder 12/07/2010  . Palpitations   . Emphysema   . HOH (hard of hearing)    Past Surgical History  Procedure Laterality Date  . Coronary artery bypass graft    . Aortic valve replacement     Family History  Problem Relation Age of Onset  . Heart attack Father   . Stroke Mother   . Hypertension    . Cancer    . Colon cancer Brother   . Emphysema Brother    History  Substance Use Topics  . Smoking status: Former Smoker -- 2.00 packs/day for 15 years    Types: Cigarettes    Quit date: 10/12/1999  . Smokeless tobacco: Never Used  . Alcohol Use: No    Review of Systems ROS reviewed and all otherwise negative except for mentioned in HPI    Allergies  Penicillins  Home Medications   Prior to Admission medications   Medication Sig Start Date End Date Taking? Authorizing Provider  acetaminophen (TYLENOL) 500 MG tablet Take 500 mg by mouth every 6  (six) hours as needed. For pain    Yes Historical Provider, MD  aspirin EC 81 MG tablet Take 81 mg by mouth daily.   Yes Historical Provider, MD  atorvastatin (LIPITOR) 10 MG tablet Take 10 mg by mouth daily.   08/25/11  Yes Historical Provider, MD  brinzolamide (AZOPT) 1 % ophthalmic suspension Place 1 drop into both eyes 2 (two) times daily.    Yes Historical Provider, MD  cholecalciferol (VITAMIN D) 1000 UNITS tablet Take 1,000 Units by mouth daily.   Yes Historical Provider, MD  LUMIGAN 0.01 % SOLN Place 1 drop into both eyes At bedtime. 12/15/10  Yes Historical Provider, MD  metoprolol (LOPRESSOR) 50 MG tablet Take 0.5 tablets (25 mg total) by mouth daily. 01/19/13  Yes [name], MD  omeprazole (PRILOSEC) 20 MG capsule Take 20 mg by mouth daily.   Yes Historical Provider, MD  tiotropium (SPIRIVA) 18 MCG inhalation capsule Place 18 mcg into inhaler and inhale daily.     Yes Historical Provider, MD  warfarin (COUMADIN) 5 MG tablet Take 5-7.5 mg by mouth daily. Take 5mg  daily EXCEPT on Thurs & Sun take 7.5mg    Yes Historical Provider, MD  furosemide (LASIX) 20 MG tablet Take 20 mg by mouth as needed.  02/07/12 09/13/13  Barbara Cower, MD   BP 138/74  Pulse 98  Temp(Src) 97.9 F (36.6 C) (Oral)  Resp 18  SpO2 97% Vitals reviewed Physical Exam Physical Examination: General appearance - alert, well appearing, and in no distress Mental status - alert, oriented to person, place, and time Eyes - no conjunctival injection, no scleral icterus Mouth - mucous membranes moist, pharynx normal without lesions Chest - clear to auscultation, no wheezes, rales or rhonchi, symmetric air entry, mild increase in respiratory effort Heart - normal rate, regular rhythm, normal S1, S2, no murmurs, rubs, clicks or gallops Abdomen - soft, nontender, nondistended, no masses or organomegaly Extremities - peripheral pulses normal, no pedal edema, no clubbing or cyanosis Skin - normal coloration and turgor, no  rashes  ED Course  Procedures (including critical care time)  9:39 PM d/w Dr. Hal Hope, triad for admission to telemetry bed.  Labs Review Labs Reviewed  CBC WITH DIFFERENTIAL - Abnormal; Notable for the following:    WBC 11.4 (*)    RBC 3.70 (*)    Hemoglobin 11.2 (*)    HCT 33.3 (*)    Neutrophils Relative % 91 (*)    Lymphocytes Relative 4 (*)    Neutro Abs 10.3 (*)    Lymphs Abs 0.5 (*)    All other components within normal limits  COMPREHENSIVE METABOLIC PANEL - Abnormal; Notable for the following:    Sodium 133 (*)    Glucose, Bld 178 (*)    Albumin 2.7 (*)    Alkaline Phosphatase 159 (*)    GFR calc non Af Amer 60 (*)    GFR calc Af Amer 69 (*)    All other components within normal limits  PRO B NATRIURETIC PEPTIDE - Abnormal; Notable for the following:    Pro B Natriuretic peptide (BNP) 1766.0 (*)    All other components within normal limits  URINALYSIS, ROUTINE W REFLEX MICROSCOPIC - Abnormal; Notable for the following:    Color, Urine AMBER (*)    APPearance CLOUDY (*)    Bilirubin Urine SMALL (*)    Ketones, ur 15 (*)    Urobilinogen, UA 2.0 (*)    All other components within normal limits  PROTIME-INR - Abnormal; Notable for the following:    Prothrombin Time 52.0 (*)    INR 6.15 (*)    All other components within normal limits  D-DIMER, QUANTITATIVE  TROPONIN I    Imaging Review Dg Chest 2 View  03/20/2014   CLINICAL DATA:  Shortness of breath. History of emphysema, congestive heart failure and aortic valve replacement.  EXAM: CHEST  2 VIEW  COMPARISON:  Chest radiographs 08/10/2011 and 02/14/2012. Chest CT 01/01/2009.  FINDINGS: The heart size and mediastinal contours are stable status post CABG and median sternotomy. There is extensive chronic lung disease with hyperinflation, architectural distortion and subpleural reticulation as correlated with prior CT. Compared with the prior radiographs, there is progressive generalized interstitial prominence  which could reflect superimposed edema. At the right apex, there is an irregular spiculated density on the frontal examination which is new. This is not clearly seen on the lateral view. There is no pleural effusion or pneumothorax.  IMPRESSION: 1. Progressive generalized interstitial prominence suspicious for edema superimposed on chronic lung disease. Alternatively, these findings could be due to progressive fibrosis and emphysema. 2. Spiculated right upper lobe density appears new and is worrisome for possible bronchogenic carcinoma. Focal inflammation or scarring are considered less likely. Chest CT is recommended for further evaluation. Ideally, this would be performed after the patient's current acute  illness has resolved.   Electronically Signed   By: Camie Patience M.D.   On: 03/20/2014 17:45     EKG Interpretation   Date/Time:  Wednesday March 20 2014 16:49:14 EDT Ventricular Rate:  91 PR Interval:  168 QRS Duration: 92 QT Interval:  357 QTC Calculation: 439 R Axis:   16 Text Interpretation:  Sinus rhythm Borderline repolarization abnormality  Baseline wander in lead(s) V3 No significant change since last tracing  Confirmed by [facility]  MD, MARTHA [phone]) on 03/20/2014 9:17:39 PM      MDM   Final diagnoses:  Shortness of breath  COPD (chronic obstructive pulmonary disease)  Supratherapeutic INR  Hypoxia    Pt presenting with increased shortness of breath and fatigue.  Pt treated for COPD exacerbation with steroids, nebs, also started on levofloxacin.  CXR shows spiculated RUL density which may be mass versus infiltrate- also possible that there is some edema overlying chronic lung changes.  BNP not significantly elevated, no lower extremity edema, but CHF is still a consideration.  D-dimer and troponin are reassuring.  Pt feeling improved after nebs in the ED.  Also, INR suprathepeutic-  No active bleeding, will need coumadin to be held and INR monitored. Admitted to triad for further  evaluation and management.      Threasa Beards, MD 03/20/14 2226

## 2014-03-20 NOTE — ED Notes (Signed)
Pt placed on monitor returning from xray. Asked pt to provide urine specimen pt stated he could not provide one at this time.

## 2014-03-20 NOTE — ED Notes (Signed)
Pt's family concerned that pt has not taken night time medications. Discussed with Dr. Canary Brim who said it was ok for pt to take home meds. Pt was given [place] sandwich to take his medications with.

## 2014-03-20 NOTE — ED Notes (Signed)
[name] from PCP via [facility], per report pt was seen for pink tinged sputum onset today & SOB, RA sats 80% upon arrival to PCP, pt rcvd 125 mg Solumedrol, 5mg  Albuterol & 0.5 mg Atrovent in route, pt O2 sats on 2 L 98%, pt NSR in route, pt takes Coumadin for heart valve replacement, pt hx of COPD, pt A&O x4, follows commands, speaks in complete sentences

## 2014-03-20 NOTE — H&P (Signed)
[facility] History and Physical  SHLOIMA CLINCH UYQ:[id number] DOB: 10-02-1930 DOA: 03/20/2014  Referring physician: ER physician. PCP: [name], MD   Chief Complaint: Shortness of breath.  HPI: [name] is a 80 y.o. male history of COPD, mechanical aortic valve on Coumadin, CAD status post CABG, chronic anemia, hyperlipidemia has been having flulike symptoms over the last 3 days. Patient was also having productive cough with shortness of breath. He had gone to his PCP and was referred to the ER. In the ER initially patient was found to be wheezing and was placed on nebulizer after which patient's symptoms has improved. Patient's family noticed that patient has been having increasing edema of the lower extremities. Patient denies any chest pain nausea vomiting diaphoresis abdominal pain or diarrhea. Chest x-ray shows chronic changes with right upper lobe spiculated lesion concerning for bronchogenic carcinoma and CT chest was suggested. On my exam patient is mildly short of breath but not wheezing. Patient does have mild edema of the lower extremities.   Review of Systems: As presented in the history of presenting illness, rest negative.  Past Medical History  Diagnosis Date  . CORONARY ATHEROSCLEROSIS NATIVE CORONARY ARTERY 04/23/2009  . DRY EYE SYNDROME 10/09/2008  . COPD 10/09/2008  . CAD, ARTERY BYPASS GRAFT 10/09/2008  . Aortic valve disorder 12/07/2010  . Palpitations   . Emphysema   . HOH (hard of hearing)    Past Surgical History  Procedure Laterality Date  . Coronary artery bypass graft    . Aortic valve replacement     Social History:  reports that he quit smoking about 14 years ago. His smoking use included Cigarettes. He has a 30 pack-year smoking history. He has never used smokeless tobacco. He reports that he does not drink alcohol or use illicit drugs. Where does patient live home. Can patient participate in ADLs? Yes.  Allergies  Allergen  Reactions  . Penicillins Anaphylaxis    Family History:  Family History  Problem Relation Age of Onset  . Heart attack Father   . Stroke Mother   . Hypertension    . Cancer    . Colon cancer Brother   . Emphysema Brother       Prior to Admission medications   Medication Sig Start Date End Date Taking? Authorizing Provider  acetaminophen (TYLENOL) 500 MG tablet Take 500 mg by mouth every 6 (six) hours as needed. For pain    Yes Historical Provider, MD  aspirin EC 81 MG tablet Take 81 mg by mouth daily.   Yes Historical Provider, MD  atorvastatin (LIPITOR) 10 MG tablet Take 10 mg by mouth daily.   08/25/11  Yes Historical Provider, MD  brinzolamide (AZOPT) 1 % ophthalmic suspension Place 1 drop into both eyes 2 (two) times daily.    Yes Historical Provider, MD  cholecalciferol (VITAMIN D) 1000 UNITS tablet Take 1,000 Units by mouth daily.   Yes Historical Provider, MD  LUMIGAN 0.01 % SOLN Place 1 drop into both eyes At bedtime. 12/15/10  Yes Historical Provider, MD  metoprolol (LOPRESSOR) 50 MG tablet Take 0.5 tablets (25 mg total) by mouth daily. 01/19/13  Yes [name], MD  omeprazole (PRILOSEC) 20 MG capsule Take 20 mg by mouth daily.   Yes Historical Provider, MD  tiotropium (SPIRIVA) 18 MCG inhalation capsule Place 18 mcg into inhaler and inhale daily.     Yes Historical Provider, MD  warfarin (COUMADIN) 5 MG tablet Take 5-7.5 mg by mouth daily. Take  5mg  daily EXCEPT on Thurs & Sun take 7.5mg    Yes Historical Provider, MD  furosemide (LASIX) 20 MG tablet Take 20 mg by mouth as needed.  02/07/12 09/13/13  Barbara Cower, MD    Physical Exam: Filed Vitals:   03/20/14 2124 03/20/14 2148 03/20/14 2149 03/20/14 2311  BP: 142/72  138/74 136/64  Pulse: 98  98 102  Temp:    97.3 F (36.3 C)  TempSrc:    Oral  Resp: 19  18 18   Height:    6\' 2"  (1.88 m)  Weight:    67.722 kg (149 lb 4.8 oz)  SpO2: 97% 97%  94%     General:  Well-developed and moderately nourished.  Eyes:  Anicteric no pallor.  ENT: No discharge from the ears eyes nose mouth.  Neck: No mass felt. JVD elevated.  Cardiovascular: S1-S2 heard.  Respiratory: No rhonchi or crepitations.  Abdomen: Soft nontender bowel sounds present. No guarding rigidity.  Skin: No rash.  Musculoskeletal: Mild edema of the lower extremities.  Psychiatric: Appears normal.  Neurologic: Alert awake oriented to time place and person. Moves all extremities.  Labs on Admission:  Basic Metabolic Panel:  Recent Labs Lab 03/20/14 1702  NA 133*  K 4.2  CL 96  CO2 23  GLUCOSE 178*  BUN 19  CREATININE 1.12  CALCIUM 8.9   Liver Function Tests:  Recent Labs Lab 03/20/14 1702  AST 16  ALT 12  ALKPHOS 159*  BILITOT 0.7  PROT 7.3  ALBUMIN 2.7*   No results found for this basename: LIPASE, AMYLASE,  in the last 168 hours No results found for this basename: AMMONIA,  in the last 168 hours CBC:  Recent Labs Lab 03/20/14 1702  WBC 11.4*  NEUTROABS 10.3*  HGB 11.2*  HCT 33.3*  MCV 90.0  PLT 200   Cardiac Enzymes:  Recent Labs Lab 03/20/14 2025  TROPONINI <0.30    BNP (last 3 results)  Recent Labs  03/20/14 1702  PROBNP 1766.0*   CBG: No results found for this basename: GLUCAP,  in the last 168 hours  Radiological Exams on Admission: Dg Chest 2 View  03/20/2014   CLINICAL DATA:  Shortness of breath. History of emphysema, congestive heart failure and aortic valve replacement.  EXAM: CHEST  2 VIEW  COMPARISON:  Chest radiographs 08/10/2011 and 02/14/2012. Chest CT 01/01/2009.  FINDINGS: The heart size and mediastinal contours are stable status post CABG and median sternotomy. There is extensive chronic lung disease with hyperinflation, architectural distortion and subpleural reticulation as correlated with prior CT. Compared with the prior radiographs, there is progressive generalized interstitial prominence which could reflect superimposed edema. At the right apex, there is an  irregular spiculated density on the frontal examination which is new. This is not clearly seen on the lateral view. There is no pleural effusion or pneumothorax.  IMPRESSION: 1. Progressive generalized interstitial prominence suspicious for edema superimposed on chronic lung disease. Alternatively, these findings could be due to progressive fibrosis and emphysema. 2. Spiculated right upper lobe density appears new and is worrisome for possible bronchogenic carcinoma. Focal inflammation or scarring are considered less likely. Chest CT is recommended for further evaluation. Ideally, this would be performed after the patient's current acute illness has resolved.   Electronically Signed   By: Camie Patience M.D.   On: 03/20/2014 17:45    EKG: Independently reviewed. Normal sinus rhythm with nonspecific ST-T changes in the lateral leads.  Assessment/Plan Active Problems:   Shortness of breath  Acute respiratory failure   1. Acute respiratory failure probably from a combination of COPD and possible CHF - continue with nebulizer Pulmicort and Levaquin and I have ordered one dose of Lasix 20 mg IV now and daily. Closely follow intake output and metabolic panel. Patient's last 2-D echo done on May 2013 was showing normal systolic function with EF of 60-65%. A repeat today has been ordered. 2. Abnormal lesion in the right upper lobe chest x-ray - CT chest without contrast has been ordered. 3. Coagulopathy secondary to Coumadin - Coumadin per pharmacy. No overt bleeding noticed. 4. Mechanical aortic valve - on Coumadin. See #3. 5. Chronic anemia - hemoglobin appears to be improved from previous. Follow CBC. 6. CAD status post CABG - denies any chest pain. Check troponins given shortness of breath.    Code Status: Full code.  Family Communication: Patient's wife and daughter.  Disposition Plan: Admit to inpatient.    Jorgina Binning N. [facility] Pager [phone].  If 7PM-7AM, please contact  night-coverage [email or web address] Password [facility] 03/20/2014, 11:28 PM

## 2014-03-21 ENCOUNTER — Inpatient Hospital Stay (HOSPITAL_COMMUNITY): Payer: Medicare Other

## 2014-03-21 DIAGNOSIS — Z5181 Encounter for therapeutic drug level monitoring: Secondary | ICD-10-CM

## 2014-03-21 DIAGNOSIS — Z7901 Long term (current) use of anticoagulants: Secondary | ICD-10-CM

## 2014-03-21 DIAGNOSIS — J189 Pneumonia, unspecified organism: Principal | ICD-10-CM

## 2014-03-21 DIAGNOSIS — R918 Other nonspecific abnormal finding of lung field: Secondary | ICD-10-CM | POA: Diagnosis present

## 2014-03-21 DIAGNOSIS — I517 Cardiomegaly: Secondary | ICD-10-CM

## 2014-03-21 LAB — COMPREHENSIVE METABOLIC PANEL
ALBUMIN: 2.3 g/dL — AB (ref 3.5–5.2)
ALK PHOS: 148 U/L — AB (ref 39–117)
ALT: 11 U/L (ref 0–53)
AST: 14 U/L (ref 0–37)
BILIRUBIN TOTAL: 0.4 mg/dL (ref 0.3–1.2)
BUN: 21 mg/dL (ref 6–23)
CHLORIDE: 97 meq/L (ref 96–112)
CO2: 22 mEq/L (ref 19–32)
Calcium: 9.1 mg/dL (ref 8.4–10.5)
Creatinine, Ser: 1.1 mg/dL (ref 0.50–1.35)
GFR calc non Af Amer: 61 mL/min — ABNORMAL LOW (ref >90)
GFR, EST AFRICAN AMERICAN: 71 mL/min — AB (ref >90)
GLUCOSE: 204 mg/dL — AB (ref 70–99)
POTASSIUM: 3.7 meq/L (ref 3.7–5.3)
Sodium: 135 mEq/L — ABNORMAL LOW (ref 137–147)
Total Protein: 6.9 g/dL (ref 6.0–8.3)

## 2014-03-21 LAB — CBC WITH DIFFERENTIAL/PLATELET
BASOS ABS: 0 10*3/uL (ref 0.0–0.1)
BASOS PCT: 0 % (ref 0–1)
EOS PCT: 0 % (ref 0–5)
Eosinophils Absolute: 0 10*3/uL (ref 0.0–0.7)
HCT: 31.8 % — ABNORMAL LOW (ref 39.0–52.0)
Hemoglobin: 10.3 g/dL — ABNORMAL LOW (ref 13.0–17.0)
LYMPHS PCT: 5 % — AB (ref 12–46)
Lymphs Abs: 0.5 10*3/uL — ABNORMAL LOW (ref 0.7–4.0)
MCH: 28.9 pg (ref 26.0–34.0)
MCHC: 32.4 g/dL (ref 30.0–36.0)
MCV: 89.3 fL (ref 78.0–100.0)
Monocytes Absolute: 0.5 10*3/uL (ref 0.1–1.0)
Monocytes Relative: 5 % (ref 3–12)
Neutro Abs: 9 10*3/uL — ABNORMAL HIGH (ref 1.7–7.7)
Neutrophils Relative %: 90 % — ABNORMAL HIGH (ref 43–77)
PLATELETS: 192 10*3/uL (ref 150–400)
RBC: 3.56 MIL/uL — ABNORMAL LOW (ref 4.22–5.81)
RDW: 13.2 % (ref 11.5–15.5)
WBC: 10 10*3/uL (ref 4.0–10.5)

## 2014-03-21 LAB — TROPONIN I
Troponin I: <0.3 ng/mL (ref <0.30)
Troponin I: <0.3 ng/mL (ref <0.30)
Troponin I: <0.3 ng/mL (ref <0.30)

## 2014-03-21 LAB — PROTIME-INR
INR: 6.86 (ref 0.00–1.49)
Prothrombin Time: 56.6 seconds — ABNORMAL HIGH (ref 11.6–15.2)

## 2014-03-21 LAB — TSH: TSH: 1.51 u[IU]/mL (ref 0.350–4.500)

## 2014-03-21 MED ORDER — WARFARIN - PHARMACIST DOSING INPATIENT: Freq: Every day | Status: DC

## 2014-03-21 MED ORDER — METHYLPREDNISOLONE SODIUM SUCC 125 MG IJ SOLR
60.0000 mg | Freq: Two times a day (BID) | INTRAMUSCULAR | Status: DC
  Administered 2014-03-21 – 2014-03-22 (×3): 60 mg via INTRAVENOUS
  Filled 2014-03-21 (×4): qty 0.96

## 2014-03-21 MED ORDER — IPRATROPIUM-ALBUTEROL 0.5-2.5 (3) MG/3ML IN SOLN
3.0000 mL | Freq: Three times a day (TID) | RESPIRATORY_TRACT | Status: DC
  Administered 2014-03-21 – 2014-03-23 (×7): 3 mL via RESPIRATORY_TRACT
  Filled 2014-03-21 (×7): qty 3

## 2014-03-21 MED ORDER — LEVOFLOXACIN IN D5W 750 MG/150ML IV SOLN: 750.0000 mg | INTRAVENOUS | Status: DC

## 2014-03-21 NOTE — Evaluation (Signed)
Clinical/Bedside Swallow Evaluation Patient Details  Name: [name] MRN: [id number] Date of Birth: Oct 19, 1929  Today's Date: 03/21/2014 Time: 1025-1040 SLP Time Calculation (min): 15 min  Past Medical History:  Past Medical History  Diagnosis Date  . CORONARY ATHEROSCLEROSIS NATIVE CORONARY ARTERY 04/23/2009  . DRY EYE SYNDROME 10/09/2008  . COPD 10/09/2008  . CAD, ARTERY BYPASS GRAFT 10/09/2008  . Aortic valve disorder 12/07/2010  . Palpitations   . Emphysema   . HOH (hard of hearing)    Past Surgical History:  Past Surgical History  Procedure Laterality Date  . Coronary artery bypass graft    . Aortic valve replacement     HPI:  [name] is a 80 y.o. male history of COPD, mechanical aortic valve on Coumadin, CAD status post CABG, chronic anemia, hyperlipidemia, admitted with respiratory distress due to combination of COPD and possible CHF.  Chest CT 6/11: advanced emphysema with new patchy areas of bilateral airspace, suspicious for infection or aspiration, nodular components in the right upper lobe and rigth middle lobe favored to be infectious or inflammatory with recommendations for f/u CT in 6 weeks to confirm resolution.    Assessment / Plan / Recommendation Clinical Impression  Patient presents with a functional appearing oropharyngeal swallow without overt indication of aspiration. Patient however has mutliple risk factors for dysphagia with silent aspiration including age, COPD, and GER with c/o occassional globus warranting instrumental testing. SLP provided education regarding current swallowing function, risk of aspiration, and importance of general safe swallowing strategies to decrease risks pending MBS results. Will f/u with MBS in am 6/12.     Aspiration Risk  Moderate    Diet Recommendation Regular;Thin liquid   Liquid Administration via: Cup;Straw Medication Administration: Whole meds with liquid Supervision: Patient able to self  feed Compensations: Slow rate;Small sips/bites Postural Changes and/or Swallow Maneuvers: Seated upright 90 degrees    Other  Recommendations Recommended Consults: MBS Oral Care Recommendations: Oral care BID   Follow Up Recommendations   (TBD)    Frequency and Duration        Pertinent Vitals/Pain n/a     Swallow Study    General HPI: [name] is a 80 y.o. male history of COPD, mechanical aortic valve on Coumadin, CAD status post CABG, chronic anemia, hyperlipidemia, admitted with respiratory distress due to combination of COPD and possible CHF.  Chest CT 6/11: advanced emphysema with new patchy areas of bilateral airspace, suspicious for infection or aspiration, nodular components in the right upper lobe and rigth middle lobe favored to be infectious or inflammatory with recommendations for f/u CT in 6 weeks to confirm resolution.  Type of Study: Bedside swallow evaluation Previous Swallow Assessment: none noted Diet Prior to this Study: Regular;Thin liquids Temperature Spikes Noted: No Respiratory Status: Nasal cannula History of Recent Intubation: No Behavior/Cognition: Alert;Cooperative;Pleasant mood Oral Cavity - Dentition: Dentures, top;Dentures, bottom Self-Feeding Abilities: Able to feed self Patient Positioning: Upright in bed Baseline Vocal Quality: Hoarse;Low vocal intensity (baseline per wife) Volitional Cough: Strong Volitional Swallow: Able to elicit    Oral/Motor/Sensory Function Overall Oral Motor/Sensory Function: Appears within functional limits for tasks assessed   Ice Chips Ice chips: Not tested   Thin Liquid Thin Liquid: Within functional limits Presentation: Cup;Self Fed;Straw    Nectar Thick Nectar Thick Liquid: Not tested   Honey Thick Honey Thick Liquid: Not tested   Puree Puree: Within functional limits Presentation: Self Fed;Spoon   Solid   GO   Jakaden Ouzts MA, CCC-SLP (  [phone]  Solid: Within functional limits Presentation: [place] 03/21/2014,10:46 AM

## 2014-03-21 NOTE — Progress Notes (Signed)
Admitted pt to rm 3E04 from ED via stretcher, pt alert and oriented, denied pain at this time, oriented to room, call bell placed within reach. Admission assessment done, orders carried out. Wife at bedside, will continue to monitor.

## 2014-03-21 NOTE — Progress Notes (Signed)
Inpatient Diabetes Program Recommendations  AACE/ADA: New Consensus Statement on Inpatient Glycemic Control (2013)  Target Ranges:  Prepandial:   less than 140 mg/dL      Peak postprandial:   less than 180 mg/dL (1-2 hours)      Critically ill patients:  140 - 180 mg/dL  Results for TAYVIN, PRESLAR (MRN [id number]) as of 03/21/2014 10:14  Ref. Range 03/06/2014 11:56 03/20/2014 17:02 03/20/2014 20:25 03/21/2014 00:08 03/21/2014 05:21  Glucose Latest Range: 70-99 mg/dL  178 (H)   204 (H)   Recommend monitoring CBG's during steroid therapy and initiate Novolog correction if needed. Thank you  Raoul Pitch BSN, RN,CDE Inpatient Diabetes Coordinator [phone] (team pager)

## 2014-03-21 NOTE — Progress Notes (Signed)
ANTICOAGULATION/ANTIBIOTIC CONSULT NOTE   Pharmacy Consult for Warfarin  Indication: Mechanical Valve   Allergies  Allergen Reactions  . Penicillins Anaphylaxis   Patient Measurements: Height: 6\' 2"  (188 cm) Weight: 148 lb (67.132 kg) (a scale) IBW/kg (Calculated) : 82.2  Labs:  Recent Labs  03/20/14 1702 03/20/14 2025 03/21/14 0008 03/21/14 0521  HGB 11.2*  --   --  10.3*  HCT 33.3*  --   --  31.8*  PLT 200  --   --  192  LABPROT 52.0*  --   --  56.6*  INR 6.15*  --   --  6.86*  CREATININE 1.12  --   --  1.10  TROPONINI  --  <0.30 <0.30 <0.30    Estimated Creatinine Clearance: 50 ml/min (by C-G formula based on Cr of 1.1).  Assessment: 28 YOM on warfarin PTA for mechanical valve. Home dose 5mg  daily except 7.5mg  on Thursdays and Sundays. Admission INR elevated at 6.1, this morning increased further to 6.8. Hgb 10.3, plts 192. No bleeding noted,  Goal of Therapy:  INR 2.5-3.5 (per outpatient anti-coag notes) Monitor platelets by anticoagulation protocol: Yes   Plan:  1. Hold warfarin 2. Daily PT/INR 3. Follow CBC and for any s/s bleeding 4. Follow need for reversal if patient needs invasive testing or starts bleeding  Lavar Rosenzweig D. Placida Cambre, PharmD, BCPS Clinical Pharmacist Pager: [phone] 03/21/2014 8:29 AM

## 2014-03-21 NOTE — Progress Notes (Signed)
Pt's INR=6.8; NP on call notified, no new orders made. Will continue to monitor.

## 2014-03-21 NOTE — Progress Notes (Signed)
[facility] PROGRESS NOTE  Filed Weights   03/20/14 2311 03/21/14 0555  Weight: 67.722 kg (149 lb 4.8 oz) 67.132 kg (148 lb)        Intake/Output Summary (Last 24 hours) at 03/21/14 0750 Last data filed at 03/21/14 0558  Gross per 24 hour  Intake    120 ml  Output    425 ml  Net   -305 ml     Assessment/Plan: Acute respiratory failure due to COPD (chronic obstructive pulmonary disease) and CAP: - On Levaquin, steroids and inhalers. - Ct chest as below, will need a follow up CXR in 6 weeks. - check a swallowing evaluation, afebrile no leukocytosis. - liberalize fluid, no further lasix no JVD or lower ext edema.  Abnormal lung finding: - Ct chest showed as below most likely compatible with PNA. - Will need CXR in 6 weeks.  Coagulopathy secondary to Coumadin/  AORTIC Mechanical VALVE REPLACEMENT, HX OF: - Coumadin per pharmacy. No overt bleeding noticed. - Supra therapeutic INR. - Mechanical aortic valve - on Coumadin.   Chronic anemia - hemoglobin appears to be improved from previous.  - Follow CBC.   Code Status: Full code.  Family Communication: Patient's wife and daughter.  Disposition Plan: Admit to inpatient.   Consultants:  none  Procedures: ECHO: none Ct chest 6.11.2015: Advanced centrilobular emphysema and scarring with new patchy areas of dependent bilateral airspace disease. These are suspicious  for infection or aspiration.   Apparent nodular components within the posterior right upper lobe and right middle lobe are favored also be infectious or inflammatory. Recommend followup with chest CT in approximately 6 weeks (ideally after appropriate antibiotic therapy) to confirm resolution.  Pleural thickening with subtle bilateral pleural calcifications. 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   Antibiotics:  levaquin 6.10.2015.  HPI/Subjective: Relates SOB is about the same  Objective: Filed Vitals:   03/20/14 2149 03/20/14 2311 03/21/14  0555 03/21/14 0729  BP: 138/74 136/64 146/72   Pulse: 98 102 95   Temp:  97.3 F (36.3 C) 97.8 F (36.6 C)   TempSrc:  Oral Oral   Resp: 18 18 18    Height:  6\' 2"  (1.88 m)    Weight:  67.722 kg (149 lb 4.8 oz) 67.132 kg (148 lb)   SpO2:  94% 96% 96%     Exam:  General: Alert, awake, oriented x3, in no acute distress.  HEENT: No bruits, no goiter. -JVD Heart: Regular rate and rhythm, no lower ext edema. Lungs: poor air movement crackles on the right. Abdomen: Soft, nontender, nondistended, positive bowel sounds.   Data Reviewed: Basic Metabolic Panel:  Recent Labs Lab 03/20/14 1702 03/21/14 0521  NA 133* 135*  K 4.2 3.7  CL 96 97  CO2 23 22  GLUCOSE 178* 204*  BUN 19 21  CREATININE 1.12 1.10  CALCIUM 8.9 9.1   Liver Function Tests:  Recent Labs Lab 03/20/14 1702 03/21/14 0521  AST 16 14  ALT 12 11  ALKPHOS 159* 148*  BILITOT 0.7 0.4  PROT 7.3 6.9  ALBUMIN 2.7* 2.3*   No results found for this basename: LIPASE, AMYLASE,  in the last 168 hours No results found for this basename: AMMONIA,  in the last 168 hours CBC:  Recent Labs Lab 03/20/14 1702 03/21/14 0521  WBC 11.4* 10.0  NEUTROABS 10.3* 9.0*  HGB 11.2* 10.3*  HCT 33.3* 31.8*  MCV 90.0 89.3  PLT 200 192   Cardiac Enzymes:  Recent Labs Lab 03/20/14 2025 03/21/14  0008 03/21/14 0521  TROPONINI <0.30 <0.30 <0.30   BNP (last 3 results)  Recent Labs  03/20/14 1702  PROBNP 1766.0*   CBG: No results found for this basename: GLUCAP,  in the last 168 hours  No results found for this or any previous visit (from the past 240 hour(s)).   Studies: Dg Chest 2 View  03/20/2014   CLINICAL DATA:  Shortness of breath. History of emphysema, congestive heart failure and aortic valve replacement.  EXAM: CHEST  2 VIEW  COMPARISON:  Chest radiographs 08/10/2011 and 02/14/2012. Chest CT 01/01/2009.  FINDINGS: The heart size and mediastinal contours are stable status post CABG and median sternotomy.  There is extensive chronic lung disease with hyperinflation, architectural distortion and subpleural reticulation as correlated with prior CT. Compared with the prior radiographs, there is progressive generalized interstitial prominence which could reflect superimposed edema. At the right apex, there is an irregular spiculated density on the frontal examination which is new. This is not clearly seen on the lateral view. There is no pleural effusion or pneumothorax.  IMPRESSION: 1. Progressive generalized interstitial prominence suspicious for edema superimposed on chronic lung disease. Alternatively, these findings could be due to progressive fibrosis and emphysema. 2. Spiculated right upper lobe density appears new and is worrisome for possible bronchogenic carcinoma. Focal inflammation or scarring are considered less likely. Chest CT is recommended for further evaluation. Ideally, this would be performed after the patient's current acute illness has resolved.   Electronically Signed   By: Camie Patience M.D.   On: 03/20/2014 17:45    Scheduled Meds: . aspirin EC  81 mg Oral Daily  . atorvastatin  10 mg Oral Daily  . brinzolamide  1 drop Both Eyes BID  . budesonide (PULMICORT) nebulizer solution  0.25 mg Nebulization BID  . cholecalciferol  1,000 Units Oral Daily  . furosemide  20 mg Intravenous Daily  . ipratropium-albuterol  3 mL Nebulization TID  . latanoprost  1 drop Both Eyes QHS  . [START ON 03/22/2014] levofloxacin (LEVAQUIN) IV  750 mg Intravenous Q24H  . metoprolol  25 mg Oral Daily  . pantoprazole  40 mg Oral Daily  . sodium chloride  3 mL Intravenous Q12H  . sodium chloride  3 mL Intravenous Q12H   Continuous Infusions:    Charlynne Cousins  [facility] Pager [phone] If 8PM-8AM, please contact night-coverage at [email or web address], password [facility] 03/21/2014, 7:50 AM  LOS: 1 day    **Disclaimer: This note may have been dictated with voice recognition software. Similar sounding  words can inadvertently be transcribed and this note may contain transcription errors which may not have been corrected upon publication of note.**

## 2014-03-21 NOTE — Progress Notes (Signed)
  Echocardiogram 2D Echocardiogram has been performed.  Diamond Nickel 03/21/2014, 12:22 PM

## 2014-03-21 NOTE — Progress Notes (Signed)
ANTICOAGULATION/ANTIBIOTIC CONSULT NOTE - Initial Consult  Pharmacy Consult for Warfarin  Indication: Mechanical Valve  Pharmacy Consult for Levaquin  Indication: COPD exacerbation, r/o PNA  Allergies  Allergen Reactions  . Penicillins Anaphylaxis   Patient Measurements: Height: 6\' 2"  (188 cm) Weight: 149 lb 4.8 oz (67.722 kg) (a scale) IBW/kg (Calculated) : 82.2  Labs:  Recent Labs  03/20/14 1702 03/20/14 2025  HGB 11.2*  --   HCT 33.3*  --   PLT 200  --   LABPROT 52.0*  --   INR 6.15*  --   CREATININE 1.12  --   TROPONINI  --  <0.30    Estimated Creatinine Clearance: 49.5 ml/min (by C-G formula based on Cr of 1.12).  Medical History: Past Medical History  Diagnosis Date  . CORONARY ATHEROSCLEROSIS NATIVE CORONARY ARTERY 04/23/2009  . DRY EYE SYNDROME 10/09/2008  . COPD 10/09/2008  . CAD, ARTERY BYPASS GRAFT 10/09/2008  . Aortic valve disorder 12/07/2010  . Palpitations   . Emphysema   . HOH (hard of hearing)    Assessment: Warfarin PTA for mechanical valve, INR is supra-therapeutic at 6.15, Hgb 11.2, will hold warfarin for now.   WBC 11.4, CXR with upper lobe density, CrCl ~50 mL/min, afebrile, to start Levaquin per pharmacy.   Goal of Therapy:  INR 2.5-3.5 (per outpatient anti-coag notes) Monitor platelets by anticoagulation protocol: Yes   Plan:  -Hold warfarin for now -Daily PT/INR  -Levaquin 750 mg IV q24h -Trend WBC, temp, renal function  -F/U any cultures, imaging  Narda Bonds 03/21/2014,12:49 AM

## 2014-03-22 ENCOUNTER — Inpatient Hospital Stay (HOSPITAL_COMMUNITY): Payer: Medicare Other

## 2014-03-22 DIAGNOSIS — R791 Abnormal coagulation profile: Secondary | ICD-10-CM

## 2014-03-22 LAB — PROTIME-INR
INR: 8.65 (ref 0.00–1.49)
INR: 7.79 (ref 0.00–1.49)
PROTHROMBIN TIME: 67.5 s — AB (ref 11.6–15.2)
PROTHROMBIN TIME: 62.3 s — AB (ref 11.6–15.2)

## 2014-03-22 MED ORDER — VITAMIN K1 10 MG/ML IJ SOLN
5.0000 mg | Freq: Once | INTRAMUSCULAR | Status: AC
  Administered 2014-03-22: 5 mg via SUBCUTANEOUS
  Filled 2014-03-22: qty 0.5

## 2014-03-22 MED ORDER — LEVOFLOXACIN 500 MG PO TABS
500.0000 mg | ORAL_TABLET | Freq: Every day | ORAL | Status: DC
  Administered 2014-03-22 – 2014-03-23 (×2): 500 mg via ORAL
  Filled 2014-03-22 (×2): qty 1

## 2014-03-22 MED ORDER — VITAMIN K1 1 MG/0.5ML IJ SOLN
1.0000 mg | Freq: Once | INTRAMUSCULAR | Status: AC
  Administered 2014-03-22: 1 mg via SUBCUTANEOUS
  Filled 2014-03-22: qty 0.5

## 2014-03-22 MED ORDER — PREDNISONE 50 MG PO TABS
50.0000 mg | ORAL_TABLET | Freq: Every day | ORAL | Status: DC
  Administered 2014-03-23: 50 mg via ORAL
  Filled 2014-03-22 (×2): qty 1

## 2014-03-22 NOTE — Progress Notes (Signed)
Speech Language Pathology    Patient Details Name: [name] MRN: [id number] DOB: [birth year] Today's Date: 03/22/2014 Time:  -     MBS completed and full report will be documented.  Recommend:  Continue regular texture diet and thin liquids.  Delayed esophageal emptying with mid esophagus filled with barium following barium pill and thin barium.  Must stay up one hour after meals/snacks.  [name] [address] [place] [phone]  03/22/2014

## 2014-03-22 NOTE — Procedures (Addendum)
Objective Swallowing Evaluation: Modified Barium Swallowing Study  Patient Details  Name: [name] MRN: [id number] Date of Birth: 07/31/1930  Today's Date: 03/22/2014 Time: 1015-1030 SLP Time Calculation (min): 15 min  Past Medical History:  Past Medical History  Diagnosis Date  . CORONARY ATHEROSCLEROSIS NATIVE CORONARY ARTERY 04/23/2009  . DRY EYE SYNDROME 10/09/2008  . COPD 10/09/2008  . CAD, ARTERY BYPASS GRAFT 10/09/2008  . Aortic valve disorder 12/07/2010  . Palpitations   . Emphysema   . HOH (hard of hearing)    Past Surgical History:  Past Surgical History  Procedure Laterality Date  . Coronary artery bypass graft    . Aortic valve replacement     HPI:  [name] is a 80 y.o. male history of COPD, mechanical aortic valve on Coumadin, CAD status post CABG, chronic anemia, hyperlipidemia, admitted with respiratory distress due to combination of COPD and possible CHF.  Chest CT 6/11: advanced emphysema with new patchy areas of bilateral airspace, suspicious for infection or aspiration, nodular components in the right upper lobe and rigth middle lobe favored to be infectious or inflammatory with recommendations for f/u CT in 6 weeks to confirm resolution.      Assessment / Plan / Recommendation Clinical Impression  Dysphagia Diagnosis: Suspected primary esophageal dysphagia;Mild pharyngeal phase dysphagia Clinical impression: Pt. exhibits mild pharyngeal dysphagia and a suspected primary esophageal dysphagia possibly the source of current pna    MBS does not diagnose deficits below the level of the UES, however, scan revealed esophageal stasis following barium pill which lodged at his gastroesophageal junction.  Additional sips thin reached distal esophagus then ascended to mid esophageal level.  Laryngeal vestibule penetration present once following large sip thin with delayed cough.  Recommend he continue regular texture diet, thin liquids, no straws presently and  strict esophageal precautions: stay up minimum 1 hour after meals, alternate solids/liquids, slow pace with rest breaks, small sips and crush pills if large.  He may benefit from further esophageal work up to rule out stricture etc..     Treatment Recommendation  Therapy as outlined in treatment plan below    Diet Recommendation Regular;Thin liquid   Liquid Administration via: Cup;No straw Medication Administration: Crushed with puree (if pill is large) Supervision: Patient able to self feed;Full supervision/cueing for compensatory strategies Compensations: Slow rate;Small sips/bites;Multiple dry swallows after each bite/sip;Follow solids with liquid Postural Changes and/or Swallow Maneuvers: Seated upright 90 degrees;Upright 30-60 min after meal    Other  Recommendations Oral Care Recommendations: Oral care BID   Follow Up Recommendations   (TBD)    Frequency and Duration min 2x/week  2 weeks   Pertinent Vitals/Pain WDL           Reason for Referral Objectively evaluate swallowing function   Oral Phase Oral Preparation/Oral Phase Oral Phase: WFL   Pharyngeal Phase Pharyngeal Phase Pharyngeal Phase: Impaired Pharyngeal - Nectar Pharyngeal - Nectar Teaspoon: Delayed swallow initiation;Premature spillage to valleculae;Pharyngeal residue - valleculae;Reduced tongue base retraction;Pharyngeal residue - pyriform sinuses;Reduced laryngeal elevation Pharyngeal - Nectar Cup: Pharyngeal residue - valleculae;Pharyngeal residue - pyriform sinuses;Reduced tongue base retraction;Reduced laryngeal elevation Pharyngeal - Thin Pharyngeal - Thin Teaspoon: Pharyngeal residue - valleculae;Pharyngeal residue - pyriform sinuses;Reduced laryngeal elevation;Reduced tongue base retraction Pharyngeal - Thin Cup: Penetration/Aspiration during swallow;Pharyngeal residue - valleculae;Pharyngeal residue - pyriform sinuses;Reduced airway/laryngeal closure;Reduced tongue base retraction;Reduced laryngeal  elevation (with large sip) Penetration/Aspiration details (thin cup): Material enters airway, remains ABOVE vocal cords and not ejected out Pharyngeal - Solids Pharyngeal -  Puree: Pharyngeal residue - valleculae;Reduced tongue base retraction;Delayed swallow initiation;Premature spillage to valleculae Pharyngeal - Regular: Pharyngeal residue - valleculae;Reduced tongue base retraction  Cervical Esophageal Phase    GO    Cervical Esophageal Phase Cervical Esophageal Phase: [name]         [place] [employer] [phone]  03/22/2014

## 2014-03-22 NOTE — Progress Notes (Signed)
[facility] PROGRESS NOTE  Filed Weights   03/20/14 2311 03/21/14 0555 03/22/14 0503  Weight: 67.722 kg (149 lb 4.8 oz) 67.132 kg (148 lb) 66.679 kg (147 lb)        Intake/Output Summary (Last 24 hours) at 03/22/14 0945 Last data filed at 03/21/14 1900  Gross per 24 hour  Intake    480 ml  Output    400 ml  Net     80 ml     Assessment/Plan: Acute respiratory failure due to COPD (chronic obstructive pulmonary disease) and CAP: - Started empirically on IV  Levaquin, steroids and inhalers. - Ct chest as below, will need a follow up CXR in 6 weeks. - check a swallowing evaluation, recommended an MBS. - fluid restrict. Change levaquin and steroids to oral.  Abnormal lung finding: - Ct chest showed as below most likely compatible with PNA. - Will need CXR in 6 weeks.  Coagulopathy secondary to Coumadin/  AORTIC Mechanical VALVE REPLACEMENT, HX OF: - Coumadin per pharmacy. No overt bleeding noticed. - Supra therapeutic INR 8.6, give 1 mg of Vit K. - Mechanical aortic valve - on Coumadin.   Chronic anemia - hemoglobin appears to be improved from previous.  - Follow CBC.   Code Status: Full code.  Family Communication: Patient's wife and daughter.  Disposition Plan: Admit to inpatient.   Consultants:  none  Procedures: ECHO: none Ct chest 6.11.2015: Advanced centrilobular emphysema and scarring with new patchy areas of dependent bilateral airspace disease. These are suspicious  for infection or aspiration.   Apparent nodular components within the posterior right upper lobe and right middle lobe are favored also be infectious or inflammatory. Recommend followup with chest CT in approximately 6 weeks (ideally after appropriate antibiotic therapy) to confirm resolution.  Pleural thickening with subtle bilateral pleural calcifications. 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   Antibiotics:  levaquin 6.10.2015.  HPI/Subjective: SOB resolved, feels  btter  Objective: Filed Vitals:   03/21/14 2246 03/22/14 0503 03/22/14 0728 03/22/14 0935  BP: 155/72 132/64  136/68  Pulse: 92 97  99  Temp:  97.7 F (36.5 C)  97.7 F (36.5 C)  TempSrc:  Oral    Resp:  18  18  Height:      Weight:  66.679 kg (147 lb)    SpO2:  98% 93% 96%     Exam:  General: Alert, awake, oriented x3, in no acute distress.  HEENT: No bruits, no goiter. -JVD Heart: Regular rate and rhythm, no lower ext edema. Lungs: poor air movement crackles on the right. Abdomen: Soft, nontender, nondistended, positive bowel sounds.   Data Reviewed: Basic Metabolic Panel:  Recent Labs Lab 03/20/14 1702 03/21/14 0521  NA 133* 135*  K 4.2 3.7  CL 96 97  CO2 23 22  GLUCOSE 178* 204*  BUN 19 21  CREATININE 1.12 1.10  CALCIUM 8.9 9.1   Liver Function Tests:  Recent Labs Lab 03/20/14 1702 03/21/14 0521  AST 16 14  ALT 12 11  ALKPHOS 159* 148*  BILITOT 0.7 0.4  PROT 7.3 6.9  ALBUMIN 2.7* 2.3*   No results found for this basename: LIPASE, AMYLASE,  in the last 168 hours No results found for this basename: AMMONIA,  in the last 168 hours CBC:  Recent Labs Lab 03/20/14 1702 03/21/14 0521  WBC 11.4* 10.0  NEUTROABS 10.3* 9.0*  HGB 11.2* 10.3*  HCT 33.3* 31.8*  MCV 90.0 89.3  PLT 200 192   Cardiac Enzymes:  Recent Labs Lab 03/20/14 2025 03/21/14 0008 03/21/14 0521 03/21/14 1111  TROPONINI <0.30 <0.30 <0.30 <0.30   BNP (last 3 results)  Recent Labs  03/20/14 1702  PROBNP 1766.0*   CBG: No results found for this basename: GLUCAP,  in the last 168 hours  No results found for this or any previous visit (from the past 240 hour(s)).   Studies: Dg Chest 2 View  03/20/2014   CLINICAL DATA:  Shortness of breath. History of emphysema, congestive heart failure and aortic valve replacement.  EXAM: CHEST  2 VIEW  COMPARISON:  Chest radiographs 08/10/2011 and 02/14/2012. Chest CT 01/01/2009.  FINDINGS: The heart size and mediastinal contours are  stable status post CABG and median sternotomy. There is extensive chronic lung disease with hyperinflation, architectural distortion and subpleural reticulation as correlated with prior CT. Compared with the prior radiographs, there is progressive generalized interstitial prominence which could reflect superimposed edema. At the right apex, there is an irregular spiculated density on the frontal examination which is new. This is not clearly seen on the lateral view. There is no pleural effusion or pneumothorax.  IMPRESSION: 1. Progressive generalized interstitial prominence suspicious for edema superimposed on chronic lung disease. Alternatively, these findings could be due to progressive fibrosis and emphysema. 2. Spiculated right upper lobe density appears new and is worrisome for possible bronchogenic carcinoma. Focal inflammation or scarring are considered less likely. Chest CT is recommended for further evaluation. Ideally, this would be performed after the patient's current acute illness has resolved.   Electronically Signed   By: Camie Patience M.D.   On: 03/20/2014 17:45   Ct Chest Wo Contrast  03/21/2014   CLINICAL DATA:  Possible right upper lobe lung nodule on prior chest radiograph.  EXAM: CT CHEST WITHOUT CONTRAST  TECHNIQUE: Multidetector CT imaging of the chest was performed following the standard protocol without IV contrast.  COMPARISON:  03/20/2014 chest radiograph. Most recent chest CT of 01/01/2009.  FINDINGS: Lungs/Pleura: Patent airways, with mild bibasilar bronchiectasis, greater on the right. This is chronic.  Severe centrilobular emphysema. Right middle lobe opacity measures 2.1 cm on image 41/ series 202 and image 42 sagittal. Favored to be infectious or inflammatory. Not significantly masslike on sagittal reformatted images.  Minimal motion degradation inferiorly. Posterior right upper lobe patchy airspace disease. Somewhat more nodular component measures 1.6 cm on image 29/ series 202.  This likely corresponds to the plain film abnormality.  Posterior left upper lobe patchy pulmonary opacity is also new on image 41.  Minimal bilateral pleural thickening with subtle pleural calcifications bilaterally.  Heart/Mediastinum: Aortic and branch vessel atherosclerosis. Mild ascending aortic aneurysm at 4.7 cm. This is unchanged. Mild cardiomegaly with coronary artery atherosclerosis. Prior aortic valve repair and median sternotomy.  Small middle mediastinal nodes are not significantly changed and not pathologic by size criteria. Hilar regions poorly evaluated without intravenous contrast.  Fluid density in the prevascular space measures 3.1 x 1.6 cm on image 32. This is enlarged from 12/12/2008, where it measured 1.8 x 1.0 cm.  Upper Abdomen: Tiny left liver lobe cyst. Normal imaged portions of the spleen, stomach. Pancreatic atrophy. Normal imaged portions of the gallbladder, biliary tract, right adrenal gland. Bilateral fluid density renal lesions which are likely cysts.  Hyper attenuating left adrenal nodule measures 2.5 x 1.9 cm versus 2.7 x 2.0 cm on the prior exam.  Bones/Musculoskeletal: Osteopenia. Mild to moderate compression deformity involving the T6 vertebral body. This is new since 01/01/2009.  IMPRESSION: 1. Advanced centrilobular emphysema  and scarring with new patchy areas of dependent bilateral airspace disease. These are suspicious for infection or aspiration. 2. Apparent nodular components within the posterior right upper lobe and right middle lobe are favored also be infectious or inflammatory. Recommend followup with chest CT in approximately 6 weeks (ideally after appropriate antibiotic therapy) to confirm resolution. 3. Pleural thickening with subtle bilateral pleural calcifications. 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 4. Hyper attenuating left adrenal nodule which is technically indeterminate. Stability since 2010 is most consistent with a benign etiology. 5. Chronic ascending  aortic aneurysm, mild. 6. Fluid density lesion in the prevascular space is likely a pericardial cyst. This is enlarged since 2010. 7. Age indeterminate but new since 2010 T6 compression deformity.   Electronically Signed   By: Abigail Miyamoto M.D.   On: 03/21/2014 08:48    Scheduled Meds: . aspirin EC  81 mg Oral Daily  . atorvastatin  10 mg Oral Daily  . brinzolamide  1 drop Both Eyes BID  . budesonide (PULMICORT) nebulizer solution  0.25 mg Nebulization BID  . cholecalciferol  1,000 Units Oral Daily  . ipratropium-albuterol  3 mL Nebulization TID  . latanoprost  1 drop Both Eyes QHS  . methylPREDNISolone (SOLU-MEDROL) injection  60 mg Intravenous Q12H  . metoprolol  25 mg Oral Daily  . pantoprazole  40 mg Oral Daily  . phytonadione  1 mg Subcutaneous Once  . sodium chloride  3 mL Intravenous Q12H  . sodium chloride  3 mL Intravenous Q12H  . Warfarin - Pharmacist Dosing Inpatient   Does not apply q1800   Continuous Infusions:    Charlynne Cousins  [facility] Pager [phone] If 8PM-8AM, please contact night-coverage at [email or web address], password [facility] 03/22/2014, 9:45 AM  LOS: 2 days    **Disclaimer: This note may have been dictated with voice recognition software. Similar sounding words can inadvertently be transcribed and this note may contain transcription errors which may not have been corrected upon publication of note.**

## 2014-03-22 NOTE — Progress Notes (Signed)
Pt's INR 8.65.  Called in this am by lab.  Dr. Durel Salts notified via text.   Will continue to monitor.  Lexton Hidalgo,RN.

## 2014-03-22 NOTE — Progress Notes (Signed)
ANTICOAGULATION/ANTIBIOTIC CONSULT NOTE   Pharmacy Consult for Warfarin  Indication: Mechanical Valve   Allergies  Allergen Reactions  . Penicillins Anaphylaxis   Patient Measurements: Height: 6\' 2"  (188 cm) Weight: 147 lb (66.679 kg) (scale a) IBW/kg (Calculated) : 82.2  Labs:  Recent Labs  03/20/14 1702  03/21/14 0008 03/21/14 0521 03/21/14 1111 03/22/14 0614  HGB 11.2*  --   --  10.3*  --   --   HCT 33.3*  --   --  31.8*  --   --   PLT 200  --   --  192  --   --   LABPROT 52.0*  --   --  56.6*  --  67.5*  INR 6.15*  --   --  6.86*  --  8.65*  CREATININE 1.12  --   --  1.10  --   --   TROPONINI  --   < > <0.30 <0.30 <0.30  --   < > = values in this interval not displayed.  Estimated Creatinine Clearance: 49.7 ml/min (by C-G formula based on Cr of 1.1).  Assessment: 15 YOM on warfarin PTA for mechanical valve. Home dose 5mg  daily except 7.5mg  on Thursdays and Sundays. INR continues to increase- admission INR was 6.1, this morning INR is 8.6. Confirmed with patient his last dose PTA was 6/9 and he hadn't taken any extra doses before coming in. Also confirmed he has only been taking medications nurses are giving him. Levofloxacin can potentiate INR, however patient only received 1 dose and this much of a change in INR seems unlikely. Hgb 10.3, plts 192. No bleeding noted.  Goal of Therapy:  INR 2.5-3.5 (per outpatient anti-coag notes) Monitor platelets by anticoagulation protocol: Yes   Plan:  1. Hold warfarin 2. Daily PT/INR 3. Follow CBC and for any s/s bleeding 4. Follow need for reversal if patient needs invasive testing or starts bleeding  Jahshua Bonito D. Illyana Schorsch, PharmD, BCPS Clinical Pharmacist Pager: [phone] 03/22/2014 8:36 AM

## 2014-03-22 NOTE — Progress Notes (Signed)
Pt states he voided in commode over night, pt stated that he "peed a lot". Pt educated to void in urinal in order to measure urine. Pt verbalized understanding. Ronnette Hila, RN

## 2014-03-23 DIAGNOSIS — J690 Pneumonitis due to inhalation of food and vomit: Secondary | ICD-10-CM

## 2014-03-23 DIAGNOSIS — R112 Nausea with vomiting, unspecified: Secondary | ICD-10-CM

## 2014-03-23 LAB — PROTIME-INR
INR: 6.34 (ref 0.00–1.49)
Prothrombin Time: 53.2 seconds — ABNORMAL HIGH (ref 11.6–15.2)

## 2014-03-23 LAB — CBC
HEMATOCRIT: 35.7 % — AB (ref 39.0–52.0)
Hemoglobin: 11.7 g/dL — ABNORMAL LOW (ref 13.0–17.0)
MCH: 29.5 pg (ref 26.0–34.0)
MCHC: 32.8 g/dL (ref 30.0–36.0)
MCV: 90.2 fL (ref 78.0–100.0)
Platelets: 293 10*3/uL (ref 150–400)
RBC: 3.96 MIL/uL — ABNORMAL LOW (ref 4.22–5.81)
RDW: 13.3 % (ref 11.5–15.5)
WBC: 13.8 10*3/uL — ABNORMAL HIGH (ref 4.0–10.5)

## 2014-03-23 MED ORDER — LEVOFLOXACIN 500 MG PO TABS
500.0000 mg | ORAL_TABLET | Freq: Every day | ORAL | Status: DC
Start: 2014-03-23 — End: 2014-11-18

## 2014-03-23 MED ORDER — PREDNISONE 10 MG PO TABS: ORAL_TABLET | ORAL | Status: DC

## 2014-03-23 MED ORDER — PREDNISONE 20 MG PO TABS
30.0000 mg | ORAL_TABLET | Freq: Every day | ORAL | Status: DC
  Filled 2014-03-23: qty 1

## 2014-03-23 NOTE — Progress Notes (Signed)
Patient discharged to home with family, transported via son.  IV removed prior to discharge; IV site clean, dry, and intact.  Discharge instructions, education, and medications discussed with patient and son prior to discharge; both voiced understanding of information.

## 2014-03-23 NOTE — Discharge Summary (Signed)
Physician Discharge Summary  [name] JKD:[id number] DOB: February 07, 1930 DOA: 03/20/2014  PCP: [name], MD  Admit date: 03/20/2014 Discharge date: 03/23/2014  Time spent: 35 minutes  Recommendations for Outpatient Follow-up:  1. Follow up with PCP in 2 weeks. Will need a repeated CXR in 6 week. For resolution of ONA. 2. Follow up with coumadin clinic on 6.15.2015. 3. Cbc as an outpatient.  Discharge Diagnoses:  Principal Problem:   Acute respiratory failure Active Problems:   CORONARY ATHEROSCLEROSIS NATIVE CORONARY ARTERY   AORTIC VALVE REPLACEMENT, HX OF   COPD (chronic obstructive pulmonary disease)   Other nonspecific abnormal finding of lung field   Discharge Condition: stable  Diet recommendation: low sodium, heart healthy  Filed Weights   03/21/14 0555 03/22/14 0503 03/23/14 0634  Weight: 67.132 kg (148 lb) 66.679 kg (147 lb) 67.132 kg (148 lb)    History of present illness:  80 y.o. male history of COPD, mechanical aortic valve on Coumadin, CAD status post CABG, chronic anemia, hyperlipidemia has been having flulike symptoms over the last 3 days. Patient was also having productive cough with shortness of breath. He had gone to his PCP and was referred to the ER. In the ER initially patient was found to be wheezing and was placed on nebulizer after which patient's symptoms has improved. Patient's family noticed that patient has been having increasing edema of the lower extremities. Patient denies any chest pain nausea vomiting diaphoresis abdominal pain or diarrhea. Chest x-ray shows chronic changes with right upper lobe spiculated lesion concerning for bronchogenic carcinoma and CT chest was suggested. On my exam patient is mildly short of breath but not wheezing. Patient does have mild edema of the lower extremities.    Hospital Course:  Acute respiratory failure due to COPD (chronic obstructive pulmonary disease) and CAP:  - Started empirically on IV  Levaquin,, steroids and inhalers. Changed to orals. Cont to tapered steroids at home. - Ct chest as below, will need a follow up CXR in 6 weeks.  - check a swallowing evaluation,barium esophagus showed Nonspecific esophageal motility disorder with tertiary  contractions  - ? Due to aspiration PNA. SLP rec regular thin liquid diet.  Abnormal lung finding:  - Ct chest showed as below most likely compatible with PNA.  - Will need CXR in 6 weeks.   Coagulopathy secondary to Coumadin/ AORTIC Mechanical VALVE REPLACEMENT, HX OF:  - Coumadin per pharmacy. No overt bleeding noticed.  - Supra therapeutic INR 6.3, he recieved 1 mg of Vit K. Monitor home in am.  - Mechanical aortic valve - on Coumadin.   Chronic anemia - hemoglobin appears to be improved from previous.  - Follow CBC.    Procedures:  CXR  Barium esophagus.  Consultations:  noen  Discharge Exam: Filed Vitals:   03/23/14 1027  BP: 177/86  Pulse: 93  Temp:   Resp: 18    General: See progress note  Discharge Instructions You were cared for by a hospitalist during your hospital stay. If you have any questions about your discharge medications or the care you received while you were in the hospital after you are discharged, you can call the unit and asked to speak with the hospitalist on call if the hospitalist that took care of you is not available. Once you are discharged, your primary care physician will handle any further medical issues. Please note that NO REFILLS for any discharge medications will be authorized once you are discharged, as it is imperative that  you return to your primary care physician (or establish a relationship with a primary care physician if you do not have one) for your aftercare needs so that they can reassess your need for medications and monitor your lab values.      Discharge Instructions   Diet - low sodium heart healthy    Complete by:  As directed      Increase activity slowly     Complete by:  As directed             Medication List         acetaminophen 500 MG tablet  Commonly known as:  TYLENOL  Take 500 mg by mouth every 6 (six) hours as needed. For pain     aspirin EC 81 MG tablet  Take 81 mg by mouth daily.     atorvastatin 10 MG tablet  Commonly known as:  LIPITOR  Take 10 mg by mouth daily.     brinzolamide 1 % ophthalmic suspension  Commonly known as:  AZOPT  Place 1 drop into both eyes 2 (two) times daily.     cholecalciferol 1000 UNITS tablet  Commonly known as:  VITAMIN D  Take 1,000 Units by mouth daily.     furosemide 20 MG tablet  Commonly known as:  LASIX  Take 20 mg by mouth as needed.     levofloxacin 500 MG tablet  Commonly known as:  LEVAQUIN  Take 1 tablet (500 mg total) by mouth daily.     LUMIGAN 0.01 % Soln  Generic drug:  bimatoprost  Place 1 drop into both eyes At bedtime.     metoprolol 50 MG tablet  Commonly known as:  LOPRESSOR  Take 0.5 tablets (25 mg total) by mouth daily.     omeprazole 20 MG capsule  Commonly known as:  PRILOSEC  Take 20 mg by mouth daily.     predniSONE 10 MG tablet  Commonly known as:  DELTASONE  Takes3 tablets for 1 days, then 2 tabs for 1 days, then 1 tab for 1 days, and then stop.     tiotropium 18 MCG inhalation capsule  Commonly known as:  SPIRIVA  Place 18 mcg into inhaler and inhale daily.     warfarin 5 MG tablet  Commonly known as:  COUMADIN  Take 5-7.5 mg by mouth daily. Take 5mg  daily EXCEPT on Thurs & Sun take 7.5mg        Allergies  Allergen Reactions  . Penicillins Anaphylaxis   Follow-up Information   Follow up with [facility]. (Appt. on 03/25/2014 @ 315 pm)    Contact information:   [address] [place] [address]        The results of significant diagnostics from this hospitalization (including imaging, microbiology, ancillary and laboratory) are listed below for reference.    Significant Diagnostic Studies: Dg Chest  2 View  03/20/2014   CLINICAL DATA:  Shortness of breath. History of emphysema, congestive heart failure and aortic valve replacement.  EXAM: CHEST  2 VIEW  COMPARISON:  Chest radiographs 08/10/2011 and 02/14/2012. Chest CT 01/01/2009.  FINDINGS: The heart size and mediastinal contours are stable status post CABG and median sternotomy. There is extensive chronic lung disease with hyperinflation, architectural distortion and subpleural reticulation as correlated with prior CT. Compared with the prior radiographs, there is progressive generalized interstitial prominence which could reflect superimposed edema. At the right apex, there is an irregular spiculated density on the frontal examination  which is new. This is not clearly seen on the lateral view. There is no pleural effusion or pneumothorax.  IMPRESSION: 1. Progressive generalized interstitial prominence suspicious for edema superimposed on chronic lung disease. Alternatively, these findings could be due to progressive fibrosis and emphysema. 2. Spiculated right upper lobe density appears new and is worrisome for possible bronchogenic carcinoma. Focal inflammation or scarring are considered less likely. Chest CT is recommended for further evaluation. Ideally, this would be performed after the patient's current acute illness has resolved.   Electronically Signed   By: Camie Patience M.D.   On: 03/20/2014 17:45   Ct Chest Wo Contrast  03/21/2014   CLINICAL DATA:  Possible right upper lobe lung nodule on prior chest radiograph.  EXAM: CT CHEST WITHOUT CONTRAST  TECHNIQUE: Multidetector CT imaging of the chest was performed following the standard protocol without IV contrast.  COMPARISON:  03/20/2014 chest radiograph. Most recent chest CT of 01/01/2009.  FINDINGS: Lungs/Pleura: Patent airways, with mild bibasilar bronchiectasis, greater on the right. This is chronic.  Severe centrilobular emphysema. Right middle lobe opacity measures 2.1 cm on image 41/ series  202 and image 42 sagittal. Favored to be infectious or inflammatory. Not significantly masslike on sagittal reformatted images.  Minimal motion degradation inferiorly. Posterior right upper lobe patchy airspace disease. Somewhat more nodular component measures 1.6 cm on image 29/ series 202. This likely corresponds to the plain film abnormality.  Posterior left upper lobe patchy pulmonary opacity is also new on image 41.  Minimal bilateral pleural thickening with subtle pleural calcifications bilaterally.  Heart/Mediastinum: Aortic and branch vessel atherosclerosis. Mild ascending aortic aneurysm at 4.7 cm. This is unchanged. Mild cardiomegaly with coronary artery atherosclerosis. Prior aortic valve repair and median sternotomy.  Small middle mediastinal nodes are not significantly changed and not pathologic by size criteria. Hilar regions poorly evaluated without intravenous contrast.  Fluid density in the prevascular space measures 3.1 x 1.6 cm on image 32. This is enlarged from 12/12/2008, where it measured 1.8 x 1.0 cm.  Upper Abdomen: Tiny left liver lobe cyst. Normal imaged portions of the spleen, stomach. Pancreatic atrophy. Normal imaged portions of the gallbladder, biliary tract, right adrenal gland. Bilateral fluid density renal lesions which are likely cysts.  Hyper attenuating left adrenal nodule measures 2.5 x 1.9 cm versus 2.7 x 2.0 cm on the prior exam.  Bones/Musculoskeletal: Osteopenia. Mild to moderate compression deformity involving the T6 vertebral body. This is new since 01/01/2009.  IMPRESSION: 1. Advanced centrilobular emphysema and scarring with new patchy areas of dependent bilateral airspace disease. These are suspicious for infection or aspiration. 2. Apparent nodular components within the posterior right upper lobe and right middle lobe are favored also be infectious or inflammatory. Recommend followup with chest CT in approximately 6 weeks (ideally after appropriate antibiotic therapy)  to confirm resolution. 3. Pleural thickening with subtle bilateral pleural calcifications. 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 4. Hyper attenuating left adrenal nodule which is technically indeterminate. Stability since 2010 is most consistent with a benign etiology. 5. Chronic ascending aortic aneurysm, mild. 6. Fluid density lesion in the prevascular space is likely a pericardial cyst. This is enlarged since 2010. 7. Age indeterminate but new since 2010 T6 compression deformity.   Electronically Signed   By: Abigail Miyamoto M.D.   On: 03/21/2014 08:48   Dg Esophagus  03/22/2014   CLINICAL DATA:  Difficulty swallowing.  EXAM: ESOPHOGRAM/BARIUM SWALLOW  TECHNIQUE: Single contrast examination was performed using  thin barium.  FLUOROSCOPY TIME:  1 min 32 seconds.  COMPARISON:  No priors.  FINDINGS: Examination was limited by inability to properly position the patient, and patient's hearing deficit which made following commands difficult for the patient. With these limitations in mind, a single contrast barium swallow was performed. Esophageal motility was grossly abnormal, with the general failure to normally propagate the primary peristaltic wave, with multiple strong tertiary contractions. No definite esophageal mass, stricture or esophageal ring was noted. No hiatal hernia.  IMPRESSION: 1. Nonspecific esophageal motility disorder with tertiary contractions, as above.   Electronically Signed   By: Vinnie Langton M.D.   On: 03/22/2014 16:22   Dg Swallowing Func-speech Pathology  03/22/2014   Orbie Pyo [place], CCC-SLP     03/22/2014  1:30 PM Objective Swallowing Evaluation: Modified Barium Swallowing Study   Patient Details  Name: [name] MRN: [id number] Date of Birth: 11-14-1929  Today's Date: 03/22/2014 Time: 1015-1030 SLP Time Calculation (min): 15 min  Past Medical History:  Past Medical History  Diagnosis Date  . CORONARY ATHEROSCLEROSIS NATIVE CORONARY ARTERY 04/23/2009  . DRY EYE SYNDROME  10/09/2008  . COPD 10/09/2008  . CAD, ARTERY BYPASS GRAFT 10/09/2008  . Aortic valve disorder 12/07/2010  . Palpitations   . Emphysema   . HOH (hard of hearing)    Past Surgical History:  Past Surgical History  Procedure Laterality Date  . Coronary artery bypass graft    . Aortic valve replacement     HPI:  [name] is a 80 y.o. male history of COPD, mechanical  aortic valve on Coumadin, CAD status post CABG, chronic anemia,  hyperlipidemia, admitted with respiratory distress due to  combination of COPD and possible CHF.  Chest CT 6/11: advanced  emphysema with new patchy areas of bilateral airspace, suspicious  for infection or aspiration, nodular components in the right  upper lobe and rigth middle lobe favored to be infectious or  inflammatory with recommendations for f/u CT in 6 weeks to  confirm resolution.      Assessment / Plan / Recommendation Clinical Impression  Dysphagia Diagnosis: Suspected primary esophageal dysphagia;Mild  pharyngeal phase dysphagia Clinical impression: Pt. exhibits mild pharyngeal dysphagia and a  suspected primary esophageal dysphagia possibly the source of  current pna    MBS does not diagnose deficits below the level of  the UES, however, scan revealed esophageal stasis following  barium pill which lodged at his gastroesophageal junction.   Additional sips thin reached distal esophagus then ascended to  mid esophageal level.  Laryngeal vestibule penetration present  once following large sip thin with delayed cough.  Recommend he  continue regular texture diet, thin liquids, no straws presently  and strict esophageal precautions: stay up minimum 1 hour after  meals, alternate solids/liquids, slow pace with rest breaks,  small sips and crush pills if large.  He may benefit from further  esophageal work up to rule out stricture etc..     Treatment Recommendation  Therapy as outlined in treatment plan below    Diet Recommendation Regular;Thin liquid   Liquid Administration via:  Cup;No straw Medication Administration: Crushed with puree (if pill is large) Supervision: Patient able to self feed;Full supervision/cueing  for compensatory strategies Compensations: Slow rate;Small sips/bites;Multiple dry swallows  after each bite/sip;Follow solids with liquid Postural Changes and/or Swallow Maneuvers: Seated upright 90  degrees;Upright 30-60 min after meal    Other  Recommendations Oral Care Recommendations: Oral care BID   Follow Up Recommendations   (TBD)  Frequency and Duration min 2x/week  2 weeks   Pertinent Vitals/Pain WDL           Reason for Referral Objectively evaluate swallowing function   Oral Phase Oral Preparation/Oral Phase Oral Phase: WFL   Pharyngeal Phase Pharyngeal Phase Pharyngeal Phase: Impaired Pharyngeal - Nectar Pharyngeal - Nectar Teaspoon: Delayed swallow  initiation;Premature spillage to valleculae;Pharyngeal residue -  valleculae;Reduced tongue base retraction;Pharyngeal residue -  pyriform sinuses;Reduced laryngeal elevation Pharyngeal - Nectar Cup: Pharyngeal residue -  valleculae;Pharyngeal residue - pyriform sinuses;Reduced tongue  base retraction;Reduced laryngeal elevation Pharyngeal - Thin Pharyngeal - Thin Teaspoon: Pharyngeal residue -  valleculae;Pharyngeal residue - pyriform sinuses;Reduced  laryngeal elevation;Reduced tongue base retraction Pharyngeal - Thin Cup: Penetration/Aspiration during  swallow;Pharyngeal residue - valleculae;Pharyngeal residue -  pyriform sinuses;Reduced airway/laryngeal closure;Reduced tongue  base retraction;Reduced laryngeal elevation (with large sip) Penetration/Aspiration details (thin cup): Material enters  airway, remains ABOVE vocal cords and not ejected out Pharyngeal - Solids Pharyngeal - Puree: Pharyngeal residue - valleculae;Reduced  tongue base retraction;Delayed swallow initiation;Premature  spillage to valleculae Pharyngeal - Regular: Pharyngeal residue - valleculae;Reduced  tongue base retraction  Cervical  Esophageal Phase    GO    Cervical Esophageal Phase Cervical Esophageal Phase: [name]         [place]ranford Mon.Ed CCC-SLP Pager [phone]  03/22/2014    Microbiology: No results found for this or any previous visit (from the past 240 hour(s)).   Labs: Basic Metabolic Panel:  Recent Labs Lab 03/20/14 1702 03/21/14 0521  NA 133* 135*  K 4.2 3.7  CL 96 97  CO2 23 22  GLUCOSE 178* 204*  BUN 19 21  CREATININE 1.12 1.10  CALCIUM 8.9 9.1   Liver Function Tests:  Recent Labs Lab 03/20/14 1702 03/21/14 0521  AST 16 14  ALT 12 11  ALKPHOS 159* 148*  BILITOT 0.7 0.4  PROT 7.3 6.9  ALBUMIN 2.7* 2.3*   No results found for this basename: LIPASE, AMYLASE,  in the last 168 hours No results found for this basename: AMMONIA,  in the last 168 hours CBC:  Recent Labs Lab 03/20/14 1702 03/21/14 0521 03/23/14 0336  WBC 11.4* 10.0 13.8*  NEUTROABS 10.3* 9.0*  --   HGB 11.2* 10.3* 11.7*  HCT 33.3* 31.8* 35.7*  MCV 90.0 89.3 90.2  PLT 200 192 293   Cardiac Enzymes:  Recent Labs Lab 03/20/14 2025 03/21/14 0008 03/21/14 0521 03/21/14 1111  TROPONINI <0.30 <0.30 <0.30 <0.30   BNP: BNP (last 3 results)  Recent Labs  03/20/14 1702  PROBNP 1766.0*   CBG: No results found for this basename: GLUCAP,  in the last 168 hours     Signed:  Charlynne Cousins  [facility] 03/23/2014, 1:28 PM

## 2014-03-23 NOTE — Progress Notes (Addendum)
[facility] PROGRESS NOTE  Filed Weights   03/21/14 0555 03/22/14 0503 03/23/14 0634  Weight: 67.132 kg (148 lb) 66.679 kg (147 lb) 67.132 kg (148 lb)        Intake/Output Summary (Last 24 hours) at 03/23/14 0751 Last data filed at 03/22/14 2209  Gross per 24 hour  Intake    943 ml  Output    200 ml  Net    743 ml     Assessment/Plan: Acute respiratory failure due to COPD (chronic obstructive pulmonary disease) and CAP: - Started empirically on IV  Levaquin,, steroids and inhalers. Changed to orals. Cont to tapered steroids. - Ct chest as below, will need a follow up CXR in 6 weeks. - check a swallowing evaluation,barium esophagus showed Nonspecific esophageal motility disorder with tertiary  contractions - Fluid restrict. Change levaquin and steroids to oral.  Abnormal lung finding: - Ct chest showed as below most likely compatible with PNA. - Will need CXR in 6 weeks.  Coagulopathy secondary to Coumadin/  AORTIC Mechanical VALVE REPLACEMENT, HX OF: - Coumadin per pharmacy. No overt bleeding noticed. - Supra therapeutic INR 6.3, he recieved 1 mg of Vit K. Monitor home in am. - Mechanical aortic valve - on Coumadin.   Chronic anemia - hemoglobin appears to be improved from previous.  - Follow CBC.   Code Status: Full code.  Family Communication: Patient's wife and daughter.  Disposition Plan: Admit to inpatient.   Consultants:  none  Procedures: ECHO: none Ct chest 6.11.2015: Advanced centrilobular emphysema and scarring with new patchy areas of dependent bilateral airspace disease. These are suspicious  for infection or aspiration.   Apparent nodular components within the posterior right upper lobe and right middle lobe are favored also be infectious or inflammatory. Recommend followup with chest CT in approximately 6 weeks (ideally after appropriate antibiotic therapy) to confirm resolution.  Pleural thickening with subtle bilateral pleural calcifications.  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 Barium esophagus: Nonspecific esophageal motility disorder with tertiary  contractions  Antibiotics:  levaquin 6.10.2015.  HPI/Subjective: No compalins  Objective: Filed Vitals:   03/22/14 1447 03/22/14 1941 03/22/14 2128 03/23/14 0634  BP:   153/67 167/71  Pulse:  93 92 84  Temp:   98 F (36.7 C) 97.7 F (36.5 C)  TempSrc:   Oral Oral  Resp:  18 18 18   Height:      Weight:    67.132 kg (148 lb)  SpO2: 98% 85% 96% 99%     Exam:  General: Alert, awake, oriented x3, in no acute distress.  HEENT: No bruits, no goiter. -JVD Heart: Regular rate and rhythm, no lower ext edema. Lungs: poor air movement crackles on the right. Abdomen: Soft, nontender, nondistended, positive bowel sounds.   Data Reviewed: Basic Metabolic Panel:  Recent Labs Lab 03/20/14 1702 03/21/14 0521  NA 133* 135*  K 4.2 3.7  CL 96 97  CO2 23 22  GLUCOSE 178* 204*  BUN 19 21  CREATININE 1.12 1.10  CALCIUM 8.9 9.1   Liver Function Tests:  Recent Labs Lab 03/20/14 1702 03/21/14 0521  AST 16 14  ALT 12 11  ALKPHOS 159* 148*  BILITOT 0.7 0.4  PROT 7.3 6.9  ALBUMIN 2.7* 2.3*   No results found for this basename: LIPASE, AMYLASE,  in the last 168 hours No results found for this basename: AMMONIA,  in the last 168 hours CBC:  Recent Labs Lab 03/20/14 1702 03/21/14 0521 03/23/14 0336  WBC 11.4* 10.0  13.8*  NEUTROABS 10.3* 9.0*  --   HGB 11.2* 10.3* 11.7*  HCT 33.3* 31.8* 35.7*  MCV 90.0 89.3 90.2  PLT 200 192 293   Cardiac Enzymes:  Recent Labs Lab 03/20/14 2025 03/21/14 0008 03/21/14 0521 03/21/14 1111  TROPONINI <0.30 <0.30 <0.30 <0.30   BNP (last 3 results)  Recent Labs  03/20/14 1702  PROBNP 1766.0*   CBG: No results found for this basename: GLUCAP,  in the last 168 hours  No results found for this or any previous visit (from the past 240 hour(s)).   Studies: Ct Chest Wo Contrast  03/21/2014   CLINICAL DATA:   Possible right upper lobe lung nodule on prior chest radiograph.  EXAM: CT CHEST WITHOUT CONTRAST  TECHNIQUE: Multidetector CT imaging of the chest was performed following the standard protocol without IV contrast.  COMPARISON:  03/20/2014 chest radiograph. Most recent chest CT of 01/01/2009.  FINDINGS: Lungs/Pleura: Patent airways, with mild bibasilar bronchiectasis, greater on the right. This is chronic.  Severe centrilobular emphysema. Right middle lobe opacity measures 2.1 cm on image 41/ series 202 and image 42 sagittal. Favored to be infectious or inflammatory. Not significantly masslike on sagittal reformatted images.  Minimal motion degradation inferiorly. Posterior right upper lobe patchy airspace disease. Somewhat more nodular component measures 1.6 cm on image 29/ series 202. This likely corresponds to the plain film abnormality.  Posterior left upper lobe patchy pulmonary opacity is also new on image 41.  Minimal bilateral pleural thickening with subtle pleural calcifications bilaterally.  Heart/Mediastinum: Aortic and branch vessel atherosclerosis. Mild ascending aortic aneurysm at 4.7 cm. This is unchanged. Mild cardiomegaly with coronary artery atherosclerosis. Prior aortic valve repair and median sternotomy.  Small middle mediastinal nodes are not significantly changed and not pathologic by size criteria. Hilar regions poorly evaluated without intravenous contrast.  Fluid density in the prevascular space measures 3.1 x 1.6 cm on image 32. This is enlarged from 12/12/2008, where it measured 1.8 x 1.0 cm.  Upper Abdomen: Tiny left liver lobe cyst. Normal imaged portions of the spleen, stomach. Pancreatic atrophy. Normal imaged portions of the gallbladder, biliary tract, right adrenal gland. Bilateral fluid density renal lesions which are likely cysts.  Hyper attenuating left adrenal nodule measures 2.5 x 1.9 cm versus 2.7 x 2.0 cm on the prior exam.  Bones/Musculoskeletal: Osteopenia. Mild to moderate  compression deformity involving the T6 vertebral body. This is new since 01/01/2009.  IMPRESSION: 1. Advanced centrilobular emphysema and scarring with new patchy areas of dependent bilateral airspace disease. These are suspicious for infection or aspiration. 2. Apparent nodular components within the posterior right upper lobe and right middle lobe are favored also be infectious or inflammatory. Recommend followup with chest CT in approximately 6 weeks (ideally after appropriate antibiotic therapy) to confirm resolution. 3. Pleural thickening with subtle bilateral pleural calcifications. 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 4. Hyper attenuating left adrenal nodule which is technically indeterminate. Stability since 2010 is most consistent with a benign etiology. 5. Chronic ascending aortic aneurysm, mild. 6. Fluid density lesion in the prevascular space is likely a pericardial cyst. This is enlarged since 2010. 7. Age indeterminate but new since 2010 T6 compression deformity.   Electronically Signed   By: Abigail Miyamoto M.D.   On: 03/21/2014 08:48   Dg Esophagus  03/22/2014   CLINICAL DATA:  Difficulty swallowing.  EXAM: ESOPHOGRAM/BARIUM SWALLOW  TECHNIQUE: Single contrast examination was performed using  thin barium.  FLUOROSCOPY TIME:  1 min 32 seconds.  COMPARISON:  No priors.  FINDINGS: Examination was limited by inability to properly position the patient, and patient's hearing deficit which made following commands difficult for the patient. With these limitations in mind, a single contrast barium swallow was performed. Esophageal motility was grossly abnormal, with the general failure to normally propagate the primary peristaltic wave, with multiple strong tertiary contractions. No definite esophageal mass, stricture or esophageal ring was noted. No hiatal hernia.  IMPRESSION: 1. Nonspecific esophageal motility disorder with tertiary contractions, as above.   Electronically Signed   By: Vinnie Langton  M.D.   On: 03/22/2014 16:22   Dg Swallowing Func-speech Pathology  03/22/2014   Orbie Pyo [employer], CCC-SLP     03/22/2014  1:30 PM Objective Swallowing Evaluation: Modified Barium Swallowing Study   Patient Details  Name: [name] MRN: [id number] Date of Birth: 22-May-1930  Today's Date: 03/22/2014 Time: 1015-1030 SLP Time Calculation (min): 15 min  Past Medical History:  Past Medical History  Diagnosis Date  . CORONARY ATHEROSCLEROSIS NATIVE CORONARY ARTERY 04/23/2009  . DRY EYE SYNDROME 10/09/2008  . COPD 10/09/2008  . CAD, ARTERY BYPASS GRAFT 10/09/2008  . Aortic valve disorder 12/07/2010  . Palpitations   . Emphysema   . HOH (hard of hearing)    Past Surgical History:  Past Surgical History  Procedure Laterality Date  . Coronary artery bypass graft    . Aortic valve replacement     HPI:  [name] is a 80 y.o. male history of COPD, mechanical  aortic valve on Coumadin, CAD status post CABG, chronic anemia,  hyperlipidemia, admitted with respiratory distress due to  combination of COPD and possible CHF.  Chest CT 6/11: advanced  emphysema with new patchy areas of bilateral airspace, suspicious  for infection or aspiration, nodular components in the right  upper lobe and rigth middle lobe favored to be infectious or  inflammatory with recommendations for f/u CT in 6 weeks to  confirm resolution.      Assessment / Plan / Recommendation Clinical Impression  Dysphagia Diagnosis: Suspected primary esophageal dysphagia;Mild  pharyngeal phase dysphagia Clinical impression: Pt. exhibits mild pharyngeal dysphagia and a  suspected primary esophageal dysphagia possibly the source of  current pna    MBS does not diagnose deficits below the level of  the UES, however, scan revealed esophageal stasis following  barium pill which lodged at his gastroesophageal junction.   Additional sips thin reached distal esophagus then ascended to  mid esophageal level.  Laryngeal vestibule penetration present  once following  large sip thin with delayed cough.  Recommend he  continue regular texture diet, thin liquids, no straws presently  and strict esophageal precautions: stay up minimum 1 hour after  meals, alternate solids/liquids, slow pace with rest breaks,  small sips and crush pills if large.  He may benefit from further  esophageal work up to rule out stricture etc..     Treatment Recommendation  Therapy as outlined in treatment plan below    Diet Recommendation Regular;Thin liquid   Liquid Administration via: Cup;No straw Medication Administration: Crushed with puree (if pill is large) Supervision: Patient able to self feed;Full supervision/cueing  for compensatory strategies Compensations: Slow rate;Small sips/bites;Multiple dry swallows  after each bite/sip;Follow solids with liquid Postural Changes and/or Swallow Maneuvers: Seated upright 90  degrees;Upright 30-60 min after meal    Other  Recommendations Oral Care Recommendations: Oral care BID   Follow Up Recommendations   (TBD)    Frequency and Duration min 2x/week  2  weeks   Pertinent Vitals/Pain WDL           Reason for Referral Objectively evaluate swallowing function   Oral Phase Oral Preparation/Oral Phase Oral Phase: WFL   Pharyngeal Phase Pharyngeal Phase Pharyngeal Phase: Impaired Pharyngeal - Nectar Pharyngeal - Nectar Teaspoon: Delayed swallow  initiation;Premature spillage to valleculae;Pharyngeal residue -  valleculae;Reduced tongue base retraction;Pharyngeal residue -  pyriform sinuses;Reduced laryngeal elevation Pharyngeal - Nectar Cup: Pharyngeal residue -  valleculae;Pharyngeal residue - pyriform sinuses;Reduced tongue  base retraction;Reduced laryngeal elevation Pharyngeal - Thin Pharyngeal - Thin Teaspoon: Pharyngeal residue -  valleculae;Pharyngeal residue - pyriform sinuses;Reduced  laryngeal elevation;Reduced tongue base retraction Pharyngeal - Thin Cup: Penetration/Aspiration during  swallow;Pharyngeal residue - valleculae;Pharyngeal residue -   pyriform sinuses;Reduced airway/laryngeal closure;Reduced tongue  base retraction;Reduced laryngeal elevation (with large sip) Penetration/Aspiration details (thin cup): Material enters  airway, remains ABOVE vocal cords and not ejected out Pharyngeal - Solids Pharyngeal - Puree: Pharyngeal residue - valleculae;Reduced  tongue base retraction;Delayed swallow initiation;Premature  spillage to valleculae Pharyngeal - Regular: Pharyngeal residue - valleculae;Reduced  tongue base retraction  Cervical Esophageal Phase    GO    Cervical Esophageal Phase Cervical Esophageal Phase: [name]         Cranford Mon.Ed CCC-SLP Pager [phone]  03/22/2014    Scheduled Meds: . aspirin EC  81 mg Oral Daily  . atorvastatin  10 mg Oral Daily  . brinzolamide  1 drop Both Eyes BID  . budesonide (PULMICORT) nebulizer solution  0.25 mg Nebulization BID  . cholecalciferol  1,000 Units Oral Daily  . ipratropium-albuterol  3 mL Nebulization TID  . latanoprost  1 drop Both Eyes QHS  . levofloxacin  500 mg Oral Daily  . metoprolol  25 mg Oral Daily  . pantoprazole  40 mg Oral Daily  . [START ON 03/24/2014] predniSONE  30 mg Oral Q breakfast  . sodium chloride  3 mL Intravenous Q12H  . sodium chloride  3 mL Intravenous Q12H  . Warfarin - Pharmacist Dosing Inpatient   Does not apply q1800   Continuous Infusions:    Charlynne Cousins  [facility] Pager [phone] If 8PM-8AM, please contact night-coverage at [email or web address], password [facility] 03/23/2014, 7:51 AM  LOS: 3 days    **Disclaimer: This note may have been dictated with voice recognition software. Similar sounding words can inadvertently be transcribed and this note may contain transcription errors which may not have been corrected upon publication of note.**

## 2014-03-23 NOTE — Progress Notes (Signed)
Coumadin instructions requested from MD, per MD, hold dose until coumadin clinic appointment on Monday.  Discharge instructions updated.

## 2014-03-23 NOTE — Progress Notes (Signed)
ANTICOAGULATION/ANTIBIOTIC CONSULT NOTE  - Follow Up   Pharmacy Consult for Warfarin  Indication: Mechanical Valve   Allergies  Allergen Reactions  . Penicillins Anaphylaxis   Patient Measurements: Height: 6\' 2"  (188 cm) Weight: 148 lb (67.132 kg) (a scale) IBW/kg (Calculated) : 82.2  Labs:  Recent Labs  03/20/14 1702  03/21/14 0008 03/21/14 0521 03/21/14 1111 03/22/14 0614 03/22/14 1815 03/23/14 0336  HGB 11.2*  --   --  10.3*  --   --   --  11.7*  HCT 33.3*  --   --  31.8*  --   --   --  35.7*  PLT 200  --   --  192  --   --   --  293  LABPROT 52.0*  --   --  56.6*  --  67.5* 62.3* 53.2*  INR 6.15*  --   --  6.86*  --  8.65* 7.79* 6.34*  CREATININE 1.12  --   --  1.10  --   --   --   --   TROPONINI  --   < > <0.30 <0.30 <0.30  --   --   --   < > = values in this interval not displayed.  Estimated Creatinine Clearance: 50 ml/min (by C-G formula based on Cr of 1.1).  Assessment: 83 YOM on warfarin PTA for mechanical valve, admitted with supratherapeutic INR.   Home dose 5mg  daily except 7.5mg  on Thursdays and Sundays. (Confirmed with patient his last dose PTA was 6/9 and he hadn't taken any extra doses before coming in. Also confirmed he has only been taking medications nurses are giving him.)   INR is seemingly slowing trending down, though remains supratherapeutic at 6.34, s/p a total of 6 mg of subcutaneous vitamin K given yesterday.  CBC remains stable; Hgb 11.7, pltc 293. No bleeding issues noted.  Of note, levofloxacin can potentially increase the INR, though does not seem to be the likely culprit as he had only received one dose and INR was significantly elevated. No other potential drug interactions suspected at this time.   Goal of Therapy:  INR 2.5-3.5 (per outpatient anti-coag notes) Monitor platelets by anticoagulation protocol: Yes   Plan:  1. Continue to hold warfarin while INR is supratherapeutic 2. Daily PT/INR 3. Follow up for bleeding  complications 4. Follow need for reversal if patient needs invasive testing or starts bleeding  Jalik Gellatly C. D'Arcy Abraha, PharmD Clinical Pharmacist-Resident Pager: [phone] Pharmacy: [phone] 03/23/2014 8:20 AM

## 2014-03-23 NOTE — Progress Notes (Signed)
SATURATION QUALIFICATIONS: (This note is used to comply with regulatory documentation for home oxygen)  Patient Saturations on Room Air at Rest = 95%  Patient Saturations on Room Air while Ambulating = 89%  Patient ambulated 214ft on room air with assistance.  Oxygen saturation fell to 89% on room air, but rose to 92% when patient rested briefly.

## 2014-03-25 ENCOUNTER — Ambulatory Visit (INDEPENDENT_AMBULATORY_CARE_PROVIDER_SITE_OTHER): Payer: Medicare Other | Admitting: Pharmacist

## 2014-03-25 DIAGNOSIS — Z5181 Encounter for therapeutic drug level monitoring: Secondary | ICD-10-CM

## 2014-03-25 DIAGNOSIS — Z7901 Long term (current) use of anticoagulants: Secondary | ICD-10-CM

## 2014-03-25 DIAGNOSIS — Z952 Presence of prosthetic heart valve: Secondary | ICD-10-CM

## 2014-03-25 DIAGNOSIS — Z954 Presence of other heart-valve replacement: Secondary | ICD-10-CM

## 2014-03-25 DIAGNOSIS — I359 Nonrheumatic aortic valve disorder, unspecified: Secondary | ICD-10-CM

## 2014-03-25 LAB — POCT INR: INR: 1.6

## 2014-04-01 ENCOUNTER — Ambulatory Visit (INDEPENDENT_AMBULATORY_CARE_PROVIDER_SITE_OTHER): Payer: Medicare Other | Admitting: Pharmacist

## 2014-04-01 DIAGNOSIS — Z7901 Long term (current) use of anticoagulants: Secondary | ICD-10-CM

## 2014-04-01 DIAGNOSIS — Z952 Presence of prosthetic heart valve: Secondary | ICD-10-CM

## 2014-04-01 DIAGNOSIS — Z5181 Encounter for therapeutic drug level monitoring: Secondary | ICD-10-CM

## 2014-04-01 DIAGNOSIS — I359 Nonrheumatic aortic valve disorder, unspecified: Secondary | ICD-10-CM

## 2014-04-01 DIAGNOSIS — Z954 Presence of other heart-valve replacement: Secondary | ICD-10-CM

## 2014-04-01 LAB — POCT INR: INR: 2.3

## 2014-04-17 ENCOUNTER — Ambulatory Visit (INDEPENDENT_AMBULATORY_CARE_PROVIDER_SITE_OTHER): Payer: Medicare Other | Admitting: *Deleted

## 2014-04-17 DIAGNOSIS — Z5181 Encounter for therapeutic drug level monitoring: Secondary | ICD-10-CM

## 2014-04-17 DIAGNOSIS — Z954 Presence of other heart-valve replacement: Secondary | ICD-10-CM

## 2014-04-17 DIAGNOSIS — I359 Nonrheumatic aortic valve disorder, unspecified: Secondary | ICD-10-CM

## 2014-04-17 DIAGNOSIS — Z7901 Long term (current) use of anticoagulants: Secondary | ICD-10-CM

## 2014-04-17 DIAGNOSIS — Z952 Presence of prosthetic heart valve: Secondary | ICD-10-CM

## 2014-04-17 LAB — POCT INR: INR: 4.5

## 2014-05-01 ENCOUNTER — Ambulatory Visit (INDEPENDENT_AMBULATORY_CARE_PROVIDER_SITE_OTHER): Payer: Medicare Other | Admitting: *Deleted

## 2014-05-01 DIAGNOSIS — Z5181 Encounter for therapeutic drug level monitoring: Secondary | ICD-10-CM

## 2014-05-01 DIAGNOSIS — I359 Nonrheumatic aortic valve disorder, unspecified: Secondary | ICD-10-CM

## 2014-05-01 DIAGNOSIS — Z7901 Long term (current) use of anticoagulants: Secondary | ICD-10-CM

## 2014-05-01 DIAGNOSIS — Z952 Presence of prosthetic heart valve: Secondary | ICD-10-CM

## 2014-05-01 DIAGNOSIS — Z954 Presence of other heart-valve replacement: Secondary | ICD-10-CM

## 2014-05-01 LAB — POCT INR: INR: 2.7

## 2014-05-02 ENCOUNTER — Other Ambulatory Visit: Payer: Self-pay | Admitting: Cardiovascular Disease

## 2014-05-15 ENCOUNTER — Ambulatory Visit (INDEPENDENT_AMBULATORY_CARE_PROVIDER_SITE_OTHER): Payer: Medicare Other | Admitting: Pharmacist

## 2014-05-15 ENCOUNTER — Ambulatory Visit (INDEPENDENT_AMBULATORY_CARE_PROVIDER_SITE_OTHER): Payer: Medicare Other | Admitting: Cardiovascular Disease

## 2014-05-15 ENCOUNTER — Encounter: Payer: Self-pay | Admitting: Cardiovascular Disease

## 2014-05-15 VITALS — BP 128/60 | HR 80 | Ht 74.0 in | Wt 147.8 lb

## 2014-05-15 DIAGNOSIS — I2510 Atherosclerotic heart disease of native coronary artery without angina pectoris: Secondary | ICD-10-CM

## 2014-05-15 DIAGNOSIS — I10 Essential (primary) hypertension: Secondary | ICD-10-CM

## 2014-05-15 DIAGNOSIS — J449 Chronic obstructive pulmonary disease, unspecified: Secondary | ICD-10-CM

## 2014-05-15 DIAGNOSIS — Z952 Presence of prosthetic heart valve: Secondary | ICD-10-CM

## 2014-05-15 DIAGNOSIS — I209 Angina pectoris, unspecified: Secondary | ICD-10-CM

## 2014-05-15 DIAGNOSIS — Z954 Presence of other heart-valve replacement: Secondary | ICD-10-CM

## 2014-05-15 DIAGNOSIS — I359 Nonrheumatic aortic valve disorder, unspecified: Secondary | ICD-10-CM

## 2014-05-15 DIAGNOSIS — Z5181 Encounter for therapeutic drug level monitoring: Secondary | ICD-10-CM

## 2014-05-15 DIAGNOSIS — Z7901 Long term (current) use of anticoagulants: Secondary | ICD-10-CM

## 2014-05-15 DIAGNOSIS — I25119 Atherosclerotic heart disease of native coronary artery with unspecified angina pectoris: Secondary | ICD-10-CM

## 2014-05-15 LAB — POCT INR: INR: 3

## 2014-05-15 MED ORDER — METOPROLOL TARTRATE 50 MG PO TABS: 25.0000 mg | ORAL_TABLET | Freq: Every day | ORAL | Status: DC

## 2014-05-15 NOTE — Assessment & Plan Note (Signed)
Functional activity limited by dyspnea  Chronic abnormal exam with fibrotic like changes  F/U primary

## 2014-05-15 NOTE — Patient Instructions (Signed)
Your physician wants you to follow-up in:  6 MONTHS WITH DR NISHAN  You will receive a reminder letter in the mail two months in advance. If you don't receive a letter, please call our office to schedule the follow-up appointment. Your physician recommends that you continue on your current medications as directed. Please refer to the Current Medication list given to you today. 

## 2014-05-15 NOTE — Assessment & Plan Note (Signed)
Stable with no angina and good activity level.  Continue medical Rx  

## 2014-05-15 NOTE — Assessment & Plan Note (Signed)
Well controlled.  Continue current medications and low sodium Dash type diet.    

## 2014-05-15 NOTE — Progress Notes (Signed)
Patient ID: [name], male   DOB: [birth year], 80 y.o.   MRN: [id number] [name] is a 80 y.o. male with a hx of CAD, aortic stenosis, s/p mechanical St Jude AVR + 1v CABG (S-RCA) in 2001, ILD, coumadin rx. Myoview (12/06): Normal, EF 70%. Echo (02/2012): EF 60-65%, mechanical AVR ok with mean gradient 11 mmHg. Last seen by me  in 03/2013. He continues to note chronic DOE. This is unchanged. He describes NYHA Class 2b symptoms. No orthopnea, PND, edema. No chest pain or syncope.       ROS: Denies fever, malais, weight loss, blurry vision, decreased visual acuity, cough, sputum, SOB, hemoptysis, pleuritic pain, palpitaitons, heartburn, abdominal pain, melena, lower extremity edema, claudication, or rash.  All other systems reviewed and negative  General: Affect appropriate Thin frail elderly male  HEENT: normal Neck supple with no adenopathy JVP normal no bruits no thyromegaly Lungs interstitial crackles no wheezing and good diaphragmatic motion Heart:  M4/Q6 click  no  AR murmur, no rub, gallop or click PMI normal Abdomen: benighn, BS positve, no tenderness, no AAA no bruit.  No HSM or HJR Distal pulses intact with no bruits No edema Neuro non-focal Skin warm and dry No muscular weakness   Current Outpatient Prescriptions  Medication Sig Dispense Refill  . acetaminophen (TYLENOL) 500 MG tablet Take 500 mg by mouth every 6 (six) hours as needed. For pain       . aspirin EC 81 MG tablet Take 81 mg by mouth daily.      Marland Kitchen atorvastatin (LIPITOR) 10 MG tablet Take 10 mg by mouth daily.        . brinzolamide (AZOPT) 1 % ophthalmic suspension Place 1 drop into both eyes 2 (two) times daily.       . cholecalciferol (VITAMIN D) 1000 UNITS tablet Take 1,000 Units by mouth daily.      Marland Kitchen levofloxacin (LEVAQUIN) 500 MG tablet Take 1 tablet (500 mg total) by mouth daily.  4 tablet  0  . LUMIGAN 0.01 % SOLN Place 1 drop into both eyes At bedtime.      . metoprolol (LOPRESSOR) 50 MG  tablet TAKE ONE-HALF TABLET BY MOUTH ONCE DAILY  30 tablet  0  . omeprazole (PRILOSEC) 20 MG capsule Take 20 mg by mouth daily.      Marland Kitchen tiotropium (SPIRIVA) 18 MCG inhalation capsule Place 18 mcg into inhaler and inhale daily.        Marland Kitchen warfarin (COUMADIN) 5 MG tablet Take 5-7.5 mg by mouth daily. Take 5mg  daily EXCEPT on Thurs & Sun take 7.5mg       . furosemide (LASIX) 20 MG tablet Take 20 mg by mouth as needed.        No current facility-administered medications for this visit.    Allergies  Penicillins  Electrocardiogram:  SR poor R wave progression   Assessment and Plan

## 2014-05-15 NOTE — Assessment & Plan Note (Signed)
Normal valve click no AR  SBE despite age valve still functioning well  INR Rx

## 2014-06-13 ENCOUNTER — Ambulatory Visit (INDEPENDENT_AMBULATORY_CARE_PROVIDER_SITE_OTHER): Payer: Medicare Other | Admitting: *Deleted

## 2014-06-13 DIAGNOSIS — Z952 Presence of prosthetic heart valve: Secondary | ICD-10-CM

## 2014-06-13 DIAGNOSIS — Z5181 Encounter for therapeutic drug level monitoring: Secondary | ICD-10-CM

## 2014-06-13 DIAGNOSIS — Z7901 Long term (current) use of anticoagulants: Secondary | ICD-10-CM

## 2014-06-13 DIAGNOSIS — I359 Nonrheumatic aortic valve disorder, unspecified: Secondary | ICD-10-CM

## 2014-06-13 DIAGNOSIS — Z954 Presence of other heart-valve replacement: Secondary | ICD-10-CM

## 2014-06-13 LAB — POCT INR: INR: 2.3

## 2014-07-03 ENCOUNTER — Ambulatory Visit (INDEPENDENT_AMBULATORY_CARE_PROVIDER_SITE_OTHER): Payer: Medicare Other | Admitting: *Deleted

## 2014-07-03 DIAGNOSIS — Z952 Presence of prosthetic heart valve: Secondary | ICD-10-CM

## 2014-07-03 DIAGNOSIS — I359 Nonrheumatic aortic valve disorder, unspecified: Secondary | ICD-10-CM

## 2014-07-03 DIAGNOSIS — Z7901 Long term (current) use of anticoagulants: Secondary | ICD-10-CM

## 2014-07-03 DIAGNOSIS — Z5181 Encounter for therapeutic drug level monitoring: Secondary | ICD-10-CM

## 2014-07-03 DIAGNOSIS — Z954 Presence of other heart-valve replacement: Secondary | ICD-10-CM

## 2014-07-03 LAB — POCT INR: INR: 2.2

## 2014-07-16 ENCOUNTER — Ambulatory Visit (INDEPENDENT_AMBULATORY_CARE_PROVIDER_SITE_OTHER): Payer: Medicare Other | Admitting: *Deleted

## 2014-07-16 DIAGNOSIS — I359 Nonrheumatic aortic valve disorder, unspecified: Secondary | ICD-10-CM

## 2014-07-16 DIAGNOSIS — Z5181 Encounter for therapeutic drug level monitoring: Secondary | ICD-10-CM

## 2014-07-16 DIAGNOSIS — Z7901 Long term (current) use of anticoagulants: Secondary | ICD-10-CM

## 2014-07-16 DIAGNOSIS — Z952 Presence of prosthetic heart valve: Secondary | ICD-10-CM

## 2014-07-16 DIAGNOSIS — Z954 Presence of other heart-valve replacement: Secondary | ICD-10-CM

## 2014-07-16 LAB — POCT INR: INR: 3.1

## 2014-08-06 ENCOUNTER — Ambulatory Visit (INDEPENDENT_AMBULATORY_CARE_PROVIDER_SITE_OTHER): Payer: Medicare Other

## 2014-08-06 DIAGNOSIS — Z5181 Encounter for therapeutic drug level monitoring: Secondary | ICD-10-CM

## 2014-08-06 DIAGNOSIS — Z952 Presence of prosthetic heart valve: Secondary | ICD-10-CM

## 2014-08-06 DIAGNOSIS — Z954 Presence of other heart-valve replacement: Secondary | ICD-10-CM

## 2014-08-06 DIAGNOSIS — I359 Nonrheumatic aortic valve disorder, unspecified: Secondary | ICD-10-CM

## 2014-08-06 DIAGNOSIS — Z7901 Long term (current) use of anticoagulants: Secondary | ICD-10-CM

## 2014-08-06 LAB — POCT INR: INR: 3.4

## 2014-08-22 ENCOUNTER — Other Ambulatory Visit: Payer: Self-pay | Admitting: Cardiovascular Disease

## 2014-09-03 ENCOUNTER — Ambulatory Visit (INDEPENDENT_AMBULATORY_CARE_PROVIDER_SITE_OTHER): Payer: Medicare Other

## 2014-09-03 DIAGNOSIS — Z7901 Long term (current) use of anticoagulants: Secondary | ICD-10-CM

## 2014-09-03 DIAGNOSIS — Z954 Presence of other heart-valve replacement: Secondary | ICD-10-CM

## 2014-09-03 DIAGNOSIS — Z5181 Encounter for therapeutic drug level monitoring: Secondary | ICD-10-CM

## 2014-09-03 DIAGNOSIS — I359 Nonrheumatic aortic valve disorder, unspecified: Secondary | ICD-10-CM

## 2014-09-03 DIAGNOSIS — Z952 Presence of prosthetic heart valve: Secondary | ICD-10-CM

## 2014-09-03 LAB — POCT INR: INR: 3

## 2014-09-27 ENCOUNTER — Telehealth: Payer: Self-pay | Admitting: Cardiovascular Disease

## 2014-09-27 NOTE — Telephone Encounter (Signed)
[facility] CALLING TO LET DR Johnsie Cancel KNOW PT'S WARFARIN HAS CHANGES MANUFACTURER TO [place], IS THIS A PROBLEM? PLS CALL

## 2014-10-01 NOTE — Telephone Encounter (Signed)
BUSY  SIG   X1 .Adonis Housekeeper

## 2014-10-08 ENCOUNTER — Ambulatory Visit (INDEPENDENT_AMBULATORY_CARE_PROVIDER_SITE_OTHER): Payer: Medicare Other | Admitting: Pharmacist Clinician (PhC)/ Clinical Pharmacy Specialist

## 2014-10-08 DIAGNOSIS — Z952 Presence of prosthetic heart valve: Secondary | ICD-10-CM

## 2014-10-08 DIAGNOSIS — Z7901 Long term (current) use of anticoagulants: Secondary | ICD-10-CM

## 2014-10-08 DIAGNOSIS — Z5181 Encounter for therapeutic drug level monitoring: Secondary | ICD-10-CM

## 2014-10-08 DIAGNOSIS — Z954 Presence of other heart-valve replacement: Secondary | ICD-10-CM

## 2014-10-08 DIAGNOSIS — I359 Nonrheumatic aortic valve disorder, unspecified: Secondary | ICD-10-CM

## 2014-10-08 LAB — POCT INR: INR: 2.4

## 2014-10-08 NOTE — Telephone Encounter (Signed)
DISCUSSED WITH DAUGHTER  PT  HAS NOT SAID ANYTHING  ABOUT  NOT  ABLE TO GET  WARFARIN AND WAS  IN CC  TODAY   WILL DISREGARD  MESSAGE./CY

## 2014-11-06 ENCOUNTER — Ambulatory Visit (INDEPENDENT_AMBULATORY_CARE_PROVIDER_SITE_OTHER): Payer: Medicare Other | Admitting: *Deleted

## 2014-11-06 DIAGNOSIS — Z7901 Long term (current) use of anticoagulants: Secondary | ICD-10-CM

## 2014-11-06 DIAGNOSIS — I359 Nonrheumatic aortic valve disorder, unspecified: Secondary | ICD-10-CM

## 2014-11-06 DIAGNOSIS — Z5181 Encounter for therapeutic drug level monitoring: Secondary | ICD-10-CM

## 2014-11-06 DIAGNOSIS — Z954 Presence of other heart-valve replacement: Secondary | ICD-10-CM

## 2014-11-06 DIAGNOSIS — Z952 Presence of prosthetic heart valve: Secondary | ICD-10-CM

## 2014-11-06 LAB — POCT INR: INR: 3.2

## 2014-11-17 NOTE — Progress Notes (Signed)
Patient ID: DAK SZUMSKI, male   DOB: Mar 03, 1930, 81 y.o.   MRN: [id number] JOURDYN FERRIN is a 81 y.o.  male with a hx of CAD, aortic stenosis, s/p mechanical St Jude AVR + 1v CABG (S-RCA) in 2001, ILD, coumadin rx. Myoview (12/06): Normal, EF 70%. Echo (02/2012): EF 60-65%, mechanical AVR ok with mean gradient 11 mmHg. Last seen by me  in 03/2013. He continues to note chronic DOE. This is unchanged. He describes NYHA Class 2b symptoms. No orthopnea, PND, edema. No chest pain or syncope.   Hearing aids not working very well Needs f/u at [facility]   Echo 03/21/14 Study Conclusions  - Left ventricle: Prominent calcified false tendon in the LV cavity. The cavity size was normal. There was mild focal basal hypertrophy of the septum. Systolic function was normal. The estimated ejection fraction was in the range of 55% to 60%. Wall motion was normal; there were no regional wall motion abnormalities. - Aortic valve: A [employer]. [employer] mechanical prosthesis was present and functioning normally. - Mitral valve: Calcified annulus. Mild diffuse calcification, with mild involvement of chords. - Tricuspid valve: There was trivial regurgitation.  ROS: Denies fever, malais, weight loss, blurry vision, decreased visual acuity, cough, sputum, SOB, hemoptysis, pleuritic pain, palpitaitons, heartburn, abdominal pain, melena, lower extremity edema, claudication, or rash.  All other systems reviewed and negative  General: Affect appropriate Thin frail elderly male  HEENT: poor hearing aids in place  Neck supple with no adenopathy JVP normal no bruits no thyromegaly Lungs interstitial crackles no wheezing and good diaphragmatic motion Heart:  J4/H7 click  no  AR murmur, no rub, gallop or click PMI normal Abdomen: benighn, BS positve, no tenderness, no AAA no bruit.  No HSM or HJR Distal pulses intact with no bruits No edema Neuro non-focal Skin warm and dry No muscular weakness   Current  Outpatient Prescriptions  Medication Sig Dispense Refill  . acetaminophen (TYLENOL) 500 MG tablet Take 500 mg by mouth every 6 (six) hours as needed. For pain     . aspirin EC 81 MG tablet Take 81 mg by mouth daily.    Marland Kitchen atorvastatin (LIPITOR) 10 MG tablet Take 10 mg by mouth daily.      . brinzolamide (AZOPT) 1 % ophthalmic suspension Place 1 drop into both eyes 2 (two) times daily.     . cholecalciferol (VITAMIN D) 1000 UNITS tablet Take 1,000 Units by mouth daily.    . furosemide (LASIX) 20 MG tablet Take 20 mg by mouth as needed.     Marland Kitchen levofloxacin (LEVAQUIN) 500 MG tablet Take 1 tablet (500 mg total) by mouth daily. 4 tablet 0  . LUMIGAN 0.01 % SOLN Place 1 drop into both eyes At bedtime.    . metoprolol (LOPRESSOR) 50 MG tablet Take 0.5 tablets (25 mg total) by mouth daily. 30 tablet 6  . omeprazole (PRILOSEC) 20 MG capsule Take 20 mg by mouth daily.    Marland Kitchen tiotropium (SPIRIVA) 18 MCG inhalation capsule Place 18 mcg into inhaler and inhale daily.      Marland Kitchen warfarin (COUMADIN) 5 MG tablet Take 5-7.5 mg by mouth daily. Take 5mg  daily EXCEPT on Thurs & Sun take 7.5mg     . warfarin (COUMADIN) 5 MG tablet Take 1 tablet (5 mg total) by mouth as directed. Take As Directed by Coumadin Clinic 45 tablet 4   No current facility-administered medications for this visit.    Allergies  Penicillins  Electrocardiogram:   03/21/14 SR  poor R wave progression  11/18/14  SR rate 61  LVH no change   Assessment and Plan

## 2014-11-18 ENCOUNTER — Encounter: Payer: Self-pay | Admitting: Cardiovascular Disease

## 2014-11-18 ENCOUNTER — Ambulatory Visit (INDEPENDENT_AMBULATORY_CARE_PROVIDER_SITE_OTHER): Payer: Medicare Other | Admitting: Cardiovascular Disease

## 2014-11-18 ENCOUNTER — Ambulatory Visit (INDEPENDENT_AMBULATORY_CARE_PROVIDER_SITE_OTHER): Payer: Medicare Other | Admitting: *Deleted

## 2014-11-18 VITALS — BP 158/76 | HR 61 | Ht 74.0 in | Wt 155.0 lb

## 2014-11-18 DIAGNOSIS — I2510 Atherosclerotic heart disease of native coronary artery without angina pectoris: Secondary | ICD-10-CM | POA: Diagnosis not present

## 2014-11-18 DIAGNOSIS — Z954 Presence of other heart-valve replacement: Secondary | ICD-10-CM

## 2014-11-18 DIAGNOSIS — Z7901 Long term (current) use of anticoagulants: Secondary | ICD-10-CM

## 2014-11-18 DIAGNOSIS — Z952 Presence of prosthetic heart valve: Secondary | ICD-10-CM

## 2014-11-18 DIAGNOSIS — I359 Nonrheumatic aortic valve disorder, unspecified: Secondary | ICD-10-CM

## 2014-11-18 DIAGNOSIS — Z5181 Encounter for therapeutic drug level monitoring: Secondary | ICD-10-CM

## 2014-11-18 DIAGNOSIS — E785 Hyperlipidemia, unspecified: Secondary | ICD-10-CM

## 2014-11-18 LAB — POCT INR: INR: 3.4

## 2014-11-18 NOTE — Assessment & Plan Note (Signed)
Stable with no angina and good activity level.  Continue medical Rx  

## 2014-11-18 NOTE — Assessment & Plan Note (Signed)
Cholesterol is at goal.  Continue current dose of statin and diet Rx.  No myalgias or side effects.  F/U  LFT's in 6 months. No results found for: LDLCALC           

## 2014-11-18 NOTE — Assessment & Plan Note (Signed)
Normal valve sounds and function by echo no symptoms SBE prophylaxis

## 2014-11-18 NOTE — Patient Instructions (Signed)
Your physician wants you to follow-up in:  6 MONTHS WITH DR NISHAN  You will receive a reminder letter in the mail two months in advance. If you don't receive a letter, please call our office to schedule the follow-up appointment. Your physician recommends that you continue on your current medications as directed. Please refer to the Current Medication list given to you today. 

## 2014-11-18 NOTE — Assessment & Plan Note (Signed)
INR Rx no bleeding issues  AVR  F/u clinic q 4-6 weeks

## 2014-12-18 ENCOUNTER — Ambulatory Visit (INDEPENDENT_AMBULATORY_CARE_PROVIDER_SITE_OTHER): Payer: Medicare Other | Admitting: *Deleted

## 2014-12-18 DIAGNOSIS — Z952 Presence of prosthetic heart valve: Secondary | ICD-10-CM

## 2014-12-18 DIAGNOSIS — Z954 Presence of other heart-valve replacement: Secondary | ICD-10-CM | POA: Diagnosis not present

## 2014-12-18 DIAGNOSIS — Z5181 Encounter for therapeutic drug level monitoring: Secondary | ICD-10-CM | POA: Diagnosis not present

## 2014-12-18 DIAGNOSIS — I359 Nonrheumatic aortic valve disorder, unspecified: Secondary | ICD-10-CM

## 2014-12-18 DIAGNOSIS — Z7901 Long term (current) use of anticoagulants: Secondary | ICD-10-CM | POA: Diagnosis not present

## 2014-12-18 LAB — POCT INR: INR: 3.1

## 2015-01-29 ENCOUNTER — Ambulatory Visit (INDEPENDENT_AMBULATORY_CARE_PROVIDER_SITE_OTHER): Payer: Medicare Other | Admitting: Surgery

## 2015-01-29 DIAGNOSIS — Z5181 Encounter for therapeutic drug level monitoring: Secondary | ICD-10-CM | POA: Diagnosis not present

## 2015-01-29 DIAGNOSIS — Z954 Presence of other heart-valve replacement: Secondary | ICD-10-CM | POA: Diagnosis not present

## 2015-01-29 DIAGNOSIS — I359 Nonrheumatic aortic valve disorder, unspecified: Secondary | ICD-10-CM | POA: Diagnosis not present

## 2015-01-29 DIAGNOSIS — Z7901 Long term (current) use of anticoagulants: Secondary | ICD-10-CM | POA: Diagnosis not present

## 2015-01-29 DIAGNOSIS — Z952 Presence of prosthetic heart valve: Secondary | ICD-10-CM

## 2015-01-29 LAB — POCT INR: INR: 3.2

## 2015-02-03 ENCOUNTER — Telehealth: Payer: Self-pay | Admitting: Cardiovascular Disease

## 2015-02-03 NOTE — Telephone Encounter (Signed)
New message     Pt has shingles,  PCP presc acyclovir '800mg'$  five times a day.  Pt is on warfarin.  Is this ok?

## 2015-02-03 NOTE — Telephone Encounter (Signed)
Telephoned spouse and informed her that Acyclovir is ok with Coumadin. Instructed her to call if he has any other changes, N&,V, diarrhea or med change.

## 2015-02-27 ENCOUNTER — Other Ambulatory Visit: Payer: Self-pay | Admitting: Cardiovascular Disease

## 2015-03-12 ENCOUNTER — Ambulatory Visit (INDEPENDENT_AMBULATORY_CARE_PROVIDER_SITE_OTHER): Payer: Medicare Other | Admitting: *Deleted

## 2015-03-12 DIAGNOSIS — Z7901 Long term (current) use of anticoagulants: Secondary | ICD-10-CM | POA: Diagnosis not present

## 2015-03-12 DIAGNOSIS — I359 Nonrheumatic aortic valve disorder, unspecified: Secondary | ICD-10-CM

## 2015-03-12 DIAGNOSIS — Z952 Presence of prosthetic heart valve: Secondary | ICD-10-CM

## 2015-03-12 DIAGNOSIS — Z5181 Encounter for therapeutic drug level monitoring: Secondary | ICD-10-CM | POA: Diagnosis not present

## 2015-03-12 DIAGNOSIS — Z954 Presence of other heart-valve replacement: Secondary | ICD-10-CM

## 2015-03-12 LAB — POCT INR: INR: 3.1

## 2015-04-23 ENCOUNTER — Ambulatory Visit (INDEPENDENT_AMBULATORY_CARE_PROVIDER_SITE_OTHER): Payer: Medicare Other | Admitting: *Deleted

## 2015-04-23 DIAGNOSIS — Z7901 Long term (current) use of anticoagulants: Secondary | ICD-10-CM

## 2015-04-23 DIAGNOSIS — Z954 Presence of other heart-valve replacement: Secondary | ICD-10-CM

## 2015-04-23 DIAGNOSIS — I359 Nonrheumatic aortic valve disorder, unspecified: Secondary | ICD-10-CM | POA: Diagnosis not present

## 2015-04-23 DIAGNOSIS — Z952 Presence of prosthetic heart valve: Secondary | ICD-10-CM

## 2015-04-23 DIAGNOSIS — Z5181 Encounter for therapeutic drug level monitoring: Secondary | ICD-10-CM

## 2015-04-23 LAB — POCT INR: INR: 3.1

## 2015-06-04 ENCOUNTER — Ambulatory Visit (INDEPENDENT_AMBULATORY_CARE_PROVIDER_SITE_OTHER): Payer: Medicare Other | Admitting: Pharmacist

## 2015-06-04 DIAGNOSIS — Z954 Presence of other heart-valve replacement: Secondary | ICD-10-CM

## 2015-06-04 DIAGNOSIS — I359 Nonrheumatic aortic valve disorder, unspecified: Secondary | ICD-10-CM

## 2015-06-04 DIAGNOSIS — Z5181 Encounter for therapeutic drug level monitoring: Secondary | ICD-10-CM | POA: Diagnosis not present

## 2015-06-04 DIAGNOSIS — Z7901 Long term (current) use of anticoagulants: Secondary | ICD-10-CM | POA: Diagnosis not present

## 2015-06-04 DIAGNOSIS — Z952 Presence of prosthetic heart valve: Secondary | ICD-10-CM

## 2015-06-04 LAB — POCT INR: INR: 3.3

## 2015-07-16 ENCOUNTER — Ambulatory Visit (INDEPENDENT_AMBULATORY_CARE_PROVIDER_SITE_OTHER): Payer: Medicare Other | Admitting: Pharmacist

## 2015-07-16 DIAGNOSIS — Z952 Presence of prosthetic heart valve: Secondary | ICD-10-CM

## 2015-07-16 DIAGNOSIS — Z5181 Encounter for therapeutic drug level monitoring: Secondary | ICD-10-CM

## 2015-07-16 DIAGNOSIS — I359 Nonrheumatic aortic valve disorder, unspecified: Secondary | ICD-10-CM

## 2015-07-16 DIAGNOSIS — Z954 Presence of other heart-valve replacement: Secondary | ICD-10-CM | POA: Diagnosis not present

## 2015-07-16 DIAGNOSIS — Z7901 Long term (current) use of anticoagulants: Secondary | ICD-10-CM

## 2015-07-16 LAB — PROTIME-INR
INR: 4.88 — AB (ref <1.50)
PROTHROMBIN TIME: 46.3 s — AB (ref 11.6–15.2)

## 2015-07-16 LAB — POCT INR: INR: 6.3

## 2015-07-18 ENCOUNTER — Other Ambulatory Visit: Payer: Self-pay | Admitting: Cardiovascular Disease

## 2015-07-18 NOTE — Telephone Encounter (Signed)
[name], MD at 11/17/2014 2:46 PM  metoprolol (LOPRESSOR) 50 MG tabletTake 0.5 tablets (25 mg total) by mouth daily Your physician recommends that you continue on your current medications as directed. Please refer to the Current Medication list given to you today

## 2015-07-25 ENCOUNTER — Other Ambulatory Visit: Payer: Self-pay | Admitting: Cardiovascular Disease

## 2015-07-30 ENCOUNTER — Ambulatory Visit (INDEPENDENT_AMBULATORY_CARE_PROVIDER_SITE_OTHER): Payer: Medicare Other | Admitting: *Deleted

## 2015-07-30 DIAGNOSIS — Z5181 Encounter for therapeutic drug level monitoring: Secondary | ICD-10-CM

## 2015-07-30 DIAGNOSIS — Z7901 Long term (current) use of anticoagulants: Secondary | ICD-10-CM | POA: Diagnosis not present

## 2015-07-30 DIAGNOSIS — I359 Nonrheumatic aortic valve disorder, unspecified: Secondary | ICD-10-CM | POA: Diagnosis not present

## 2015-07-30 DIAGNOSIS — Z954 Presence of other heart-valve replacement: Secondary | ICD-10-CM

## 2015-07-30 DIAGNOSIS — Z952 Presence of prosthetic heart valve: Secondary | ICD-10-CM

## 2015-07-30 LAB — POCT INR: INR: 4.7

## 2015-08-13 ENCOUNTER — Ambulatory Visit (INDEPENDENT_AMBULATORY_CARE_PROVIDER_SITE_OTHER): Payer: Medicare Other | Admitting: Pharmacist

## 2015-08-13 DIAGNOSIS — Z5181 Encounter for therapeutic drug level monitoring: Secondary | ICD-10-CM

## 2015-08-13 DIAGNOSIS — Z954 Presence of other heart-valve replacement: Secondary | ICD-10-CM | POA: Diagnosis not present

## 2015-08-13 DIAGNOSIS — Z952 Presence of prosthetic heart valve: Secondary | ICD-10-CM

## 2015-08-13 DIAGNOSIS — Z7901 Long term (current) use of anticoagulants: Secondary | ICD-10-CM

## 2015-08-13 DIAGNOSIS — I359 Nonrheumatic aortic valve disorder, unspecified: Secondary | ICD-10-CM

## 2015-08-13 LAB — POCT INR: INR: 4.6

## 2015-08-15 ENCOUNTER — Ambulatory Visit: Payer: Medicare Other | Admitting: Gastroenterology

## 2015-08-27 ENCOUNTER — Ambulatory Visit (INDEPENDENT_AMBULATORY_CARE_PROVIDER_SITE_OTHER): Payer: Medicare Other | Admitting: *Deleted

## 2015-08-27 DIAGNOSIS — Z5181 Encounter for therapeutic drug level monitoring: Secondary | ICD-10-CM

## 2015-08-27 DIAGNOSIS — Z7901 Long term (current) use of anticoagulants: Secondary | ICD-10-CM | POA: Diagnosis not present

## 2015-08-27 DIAGNOSIS — I359 Nonrheumatic aortic valve disorder, unspecified: Secondary | ICD-10-CM | POA: Diagnosis not present

## 2015-08-27 DIAGNOSIS — Z954 Presence of other heart-valve replacement: Secondary | ICD-10-CM | POA: Diagnosis not present

## 2015-08-27 DIAGNOSIS — Z952 Presence of prosthetic heart valve: Secondary | ICD-10-CM

## 2015-08-27 LAB — POCT INR: INR: 2.6

## 2015-08-30 NOTE — Progress Notes (Signed)
Patient ID: [name], male   DOB: Oct 07, 1930, 81 y.o.   MRN: [id number] [name] is a 81 y.o.  male with a hx of CAD, aortic stenosis, s/p mechanical St Jude AVR + 1v CABG (S-RCA) in 2001, ILD, coumadin rx. Myoview (12/06): Normal, EF 70%. Echo (02/2012): EF 60-65%, mechanical AVR ok with mean gradient 11 mmHg. Last seen by me  in 03/2013. He continues to note chronic DOE. This is unchanged. He describes NYHA Class 2b symptoms. No orthopnea, PND, edema. No chest pain or syncope.   Hearing aids not working very well Needs f/u at [facility]   Echo 03/21/14 Study Conclusions  - Left ventricle: Prominent calcified false tendon in the LV cavity. The cavity size was normal. There was mild focal basal hypertrophy of the septum. Systolic function was normal. The estimated ejection fraction was in the range of 55% to 60%. Wall motion was normal; there were no regional wall motion abnormalities. - Aortic valve: A St. Jude [employer] mechanical prosthesis was present and functioning normally. - Mitral valve: Calcified annulus. Mild diffuse calcification, with mild involvement of chords. - Tricuspid valve: There was trivial regurgitation.  ROS: Denies fever, malais, weight loss, blurry vision, decreased visual acuity, cough, sputum, SOB, hemoptysis, pleuritic pain, palpitaitons, heartburn, abdominal pain, melena, lower extremity edema, claudication, or rash.  All other systems reviewed and negative  General: Affect appropriate Thin frail elderly male  HEENT: poor hearing aids in place  Neck supple with no adenopathy JVP normal no bruits no thyromegaly Lungs interstitial crackles no wheezing and good diaphragmatic motion Heart:  Q4/O9 click  no  AR murmur, no rub, gallop or click PMI normal Abdomen: benighn, BS positve, no tenderness, no AAA no bruit.  No HSM or HJR Distal pulses intact with no bruits No edema Neuro non-focal Skin warm and dry No muscular weakness   Current  Outpatient Prescriptions  Medication Sig Dispense Refill  . acetaminophen (TYLENOL) 500 MG tablet Take 500 mg by mouth every 6 (six) hours as needed. For pain     . aspirin EC 81 MG tablet Take 81 mg by mouth daily.    . brinzolamide (AZOPT) 1 % ophthalmic suspension Place 1 drop into both eyes 2 (two) times daily.     . cholecalciferol (VITAMIN D) 1000 UNITS tablet Take 1,000 Units by mouth daily.    Marland Kitchen LUMIGAN 0.01 % SOLN Place 1 drop into both eyes At bedtime.    . metoprolol (LOPRESSOR) 50 MG tablet TAKE ONE-HALF TABLET BY MOUTH ONCE DAILY 45 tablet 1  . omeprazole (PRILOSEC) 20 MG capsule Take 20 mg by mouth daily.    Marland Kitchen tiotropium (SPIRIVA) 18 MCG inhalation capsule Place 18 mcg into inhaler and inhale daily.      Marland Kitchen warfarin (COUMADIN) 5 MG tablet Take as directed by coumadin clinic 45 tablet 3   No current facility-administered medications for this visit.    Allergies  Penicillins  Electrocardiogram:   03/21/14 SR poor R wave progression  11/18/14  SR rate 61  LVH no change   Assessment and Plan  AVR:  Normal function f/u coumadin clinic for INR HTN Well controlled.  Continue current medications and low sodium Dash type diet.   Shingles  Resolved rash gone fortunately no residual pain Anticoagulation  INR;s bee a bit low but would rather this than too high  Cachexia:  Chronic despite good diet no signs of occult CA f/u primary   Jenkins Rouge

## 2015-09-01 ENCOUNTER — Ambulatory Visit (INDEPENDENT_AMBULATORY_CARE_PROVIDER_SITE_OTHER): Payer: Medicare Other | Admitting: Cardiovascular Disease

## 2015-09-01 ENCOUNTER — Encounter: Payer: Self-pay | Admitting: Cardiovascular Disease

## 2015-09-01 VITALS — BP 160/62 | HR 69 | Ht 74.0 in | Wt 146.4 lb

## 2015-09-01 DIAGNOSIS — I2510 Atherosclerotic heart disease of native coronary artery without angina pectoris: Secondary | ICD-10-CM

## 2015-09-01 DIAGNOSIS — I10 Essential (primary) hypertension: Secondary | ICD-10-CM | POA: Diagnosis not present

## 2015-09-01 DIAGNOSIS — I359 Nonrheumatic aortic valve disorder, unspecified: Secondary | ICD-10-CM

## 2015-09-01 NOTE — Patient Instructions (Signed)

## 2015-09-10 ENCOUNTER — Ambulatory Visit (INDEPENDENT_AMBULATORY_CARE_PROVIDER_SITE_OTHER): Payer: Medicare Other | Admitting: Pharmacist

## 2015-09-10 DIAGNOSIS — I359 Nonrheumatic aortic valve disorder, unspecified: Secondary | ICD-10-CM

## 2015-09-10 DIAGNOSIS — Z952 Presence of prosthetic heart valve: Secondary | ICD-10-CM

## 2015-09-10 DIAGNOSIS — Z954 Presence of other heart-valve replacement: Secondary | ICD-10-CM | POA: Diagnosis not present

## 2015-09-10 DIAGNOSIS — Z7901 Long term (current) use of anticoagulants: Secondary | ICD-10-CM

## 2015-09-10 DIAGNOSIS — Z5181 Encounter for therapeutic drug level monitoring: Secondary | ICD-10-CM

## 2015-09-10 LAB — POCT INR: INR: 4.1

## 2015-09-24 ENCOUNTER — Ambulatory Visit (INDEPENDENT_AMBULATORY_CARE_PROVIDER_SITE_OTHER): Payer: Medicare Other | Admitting: Pharmacist

## 2015-09-24 DIAGNOSIS — Z7901 Long term (current) use of anticoagulants: Secondary | ICD-10-CM | POA: Diagnosis not present

## 2015-09-24 DIAGNOSIS — Z952 Presence of prosthetic heart valve: Secondary | ICD-10-CM

## 2015-09-24 DIAGNOSIS — Z5181 Encounter for therapeutic drug level monitoring: Secondary | ICD-10-CM

## 2015-09-24 DIAGNOSIS — Z954 Presence of other heart-valve replacement: Secondary | ICD-10-CM | POA: Diagnosis not present

## 2015-09-24 DIAGNOSIS — I359 Nonrheumatic aortic valve disorder, unspecified: Secondary | ICD-10-CM | POA: Diagnosis not present

## 2015-09-24 LAB — POCT INR: INR: 3.6

## 2015-10-08 ENCOUNTER — Ambulatory Visit (INDEPENDENT_AMBULATORY_CARE_PROVIDER_SITE_OTHER): Payer: Medicare Other | Admitting: *Deleted

## 2015-10-08 DIAGNOSIS — Z954 Presence of other heart-valve replacement: Secondary | ICD-10-CM | POA: Diagnosis not present

## 2015-10-08 DIAGNOSIS — I359 Nonrheumatic aortic valve disorder, unspecified: Secondary | ICD-10-CM

## 2015-10-08 DIAGNOSIS — Z7901 Long term (current) use of anticoagulants: Secondary | ICD-10-CM

## 2015-10-08 DIAGNOSIS — Z5181 Encounter for therapeutic drug level monitoring: Secondary | ICD-10-CM | POA: Diagnosis not present

## 2015-10-08 DIAGNOSIS — Z952 Presence of prosthetic heart valve: Secondary | ICD-10-CM

## 2015-10-08 LAB — POCT INR: INR: 2.8

## 2015-10-20 ENCOUNTER — Ambulatory Visit: Payer: Medicare Other | Admitting: Gastroenterology

## 2015-11-04 ENCOUNTER — Ambulatory Visit (INDEPENDENT_AMBULATORY_CARE_PROVIDER_SITE_OTHER): Payer: Medicare Other | Admitting: *Deleted

## 2015-11-04 DIAGNOSIS — Z7901 Long term (current) use of anticoagulants: Secondary | ICD-10-CM | POA: Diagnosis not present

## 2015-11-04 DIAGNOSIS — I359 Nonrheumatic aortic valve disorder, unspecified: Secondary | ICD-10-CM | POA: Diagnosis not present

## 2015-11-04 DIAGNOSIS — Z954 Presence of other heart-valve replacement: Secondary | ICD-10-CM | POA: Diagnosis not present

## 2015-11-04 DIAGNOSIS — Z5181 Encounter for therapeutic drug level monitoring: Secondary | ICD-10-CM | POA: Diagnosis not present

## 2015-11-04 DIAGNOSIS — Z952 Presence of prosthetic heart valve: Secondary | ICD-10-CM

## 2015-11-04 LAB — POCT INR: INR: 3.7

## 2015-11-18 ENCOUNTER — Ambulatory Visit (INDEPENDENT_AMBULATORY_CARE_PROVIDER_SITE_OTHER): Payer: Medicare Other | Admitting: Pharmacist

## 2015-11-18 DIAGNOSIS — Z5181 Encounter for therapeutic drug level monitoring: Secondary | ICD-10-CM | POA: Diagnosis not present

## 2015-11-18 DIAGNOSIS — Z954 Presence of other heart-valve replacement: Secondary | ICD-10-CM | POA: Diagnosis not present

## 2015-11-18 DIAGNOSIS — Z952 Presence of prosthetic heart valve: Secondary | ICD-10-CM

## 2015-11-18 DIAGNOSIS — I359 Nonrheumatic aortic valve disorder, unspecified: Secondary | ICD-10-CM | POA: Diagnosis not present

## 2015-11-18 DIAGNOSIS — Z7901 Long term (current) use of anticoagulants: Secondary | ICD-10-CM | POA: Diagnosis not present

## 2015-11-18 LAB — POCT INR: INR: 3.5

## 2015-12-03 ENCOUNTER — Other Ambulatory Visit: Payer: Self-pay | Admitting: Cardiovascular Disease

## 2015-12-09 ENCOUNTER — Other Ambulatory Visit (HOSPITAL_COMMUNITY): Payer: Self-pay | Admitting: Adult Health Nurse Practitioner

## 2015-12-09 ENCOUNTER — Ambulatory Visit (HOSPITAL_COMMUNITY)
Admission: RE | Admit: 2015-12-09 | Discharge: 2015-12-09 | Disposition: A | Discharge status: Home / Self Care | Payer: Medicare Other | Source: Ambulatory Visit | Attending: Adult Health Nurse Practitioner | Admitting: Adult Health Nurse Practitioner

## 2015-12-09 DIAGNOSIS — R634 Abnormal weight loss: Secondary | ICD-10-CM

## 2015-12-09 DIAGNOSIS — J84112 Idiopathic pulmonary fibrosis: Secondary | ICD-10-CM | POA: Insufficient documentation

## 2015-12-09 DIAGNOSIS — J439 Emphysema, unspecified: Secondary | ICD-10-CM | POA: Insufficient documentation

## 2015-12-11 ENCOUNTER — Inpatient Hospital Stay (HOSPITAL_COMMUNITY)
Admission: EM | Admit: 2015-12-11 | Discharge: 2015-12-17 | DRG: 180 | Disposition: A | Discharge status: Hospice | Payer: Medicare Other | Attending: Internal Medicine | Admitting: Internal Medicine

## 2015-12-11 ENCOUNTER — Encounter (HOSPITAL_COMMUNITY): Payer: Self-pay | Admitting: Emergency Medicine

## 2015-12-11 ENCOUNTER — Telehealth: Payer: Self-pay | Admitting: Cardiovascular Disease

## 2015-12-11 ENCOUNTER — Emergency Department (HOSPITAL_COMMUNITY): Payer: Medicare Other

## 2015-12-11 DIAGNOSIS — C3431 Malignant neoplasm of lower lobe, right bronchus or lung: Principal | ICD-10-CM | POA: Diagnosis present

## 2015-12-11 DIAGNOSIS — Z66 Do not resuscitate: Secondary | ICD-10-CM | POA: Diagnosis not present

## 2015-12-11 DIAGNOSIS — J440 Chronic obstructive pulmonary disease with acute lower respiratory infection: Secondary | ICD-10-CM | POA: Diagnosis present

## 2015-12-11 DIAGNOSIS — R1032 Left lower quadrant pain: Secondary | ICD-10-CM | POA: Diagnosis not present

## 2015-12-11 DIAGNOSIS — Z87891 Personal history of nicotine dependence: Secondary | ICD-10-CM

## 2015-12-11 DIAGNOSIS — R0602 Shortness of breath: Secondary | ICD-10-CM | POA: Diagnosis present

## 2015-12-11 DIAGNOSIS — K5900 Constipation, unspecified: Secondary | ICD-10-CM | POA: Diagnosis present

## 2015-12-11 DIAGNOSIS — Z952 Presence of prosthetic heart valve: Secondary | ICD-10-CM

## 2015-12-11 DIAGNOSIS — Z681 Body mass index (BMI) 19 or less, adult: Secondary | ICD-10-CM | POA: Diagnosis not present

## 2015-12-11 DIAGNOSIS — R519 Headache, unspecified: Secondary | ICD-10-CM

## 2015-12-11 DIAGNOSIS — H9190 Unspecified hearing loss, unspecified ear: Secondary | ICD-10-CM | POA: Diagnosis present

## 2015-12-11 DIAGNOSIS — R410 Disorientation, unspecified: Secondary | ICD-10-CM | POA: Diagnosis present

## 2015-12-11 DIAGNOSIS — E43 Unspecified severe protein-calorie malnutrition: Secondary | ICD-10-CM | POA: Diagnosis present

## 2015-12-11 DIAGNOSIS — Z7982 Long term (current) use of aspirin: Secondary | ICD-10-CM

## 2015-12-11 DIAGNOSIS — J441 Chronic obstructive pulmonary disease with (acute) exacerbation: Secondary | ICD-10-CM | POA: Diagnosis present

## 2015-12-11 DIAGNOSIS — I2510 Atherosclerotic heart disease of native coronary artery without angina pectoris: Secondary | ICD-10-CM | POA: Diagnosis present

## 2015-12-11 DIAGNOSIS — Z7901 Long term (current) use of anticoagulants: Secondary | ICD-10-CM | POA: Diagnosis not present

## 2015-12-11 DIAGNOSIS — Z79899 Other long term (current) drug therapy: Secondary | ICD-10-CM | POA: Diagnosis not present

## 2015-12-11 DIAGNOSIS — Z954 Presence of other heart-valve replacement: Secondary | ICD-10-CM | POA: Diagnosis not present

## 2015-12-11 DIAGNOSIS — R042 Hemoptysis: Secondary | ICD-10-CM | POA: Diagnosis present

## 2015-12-11 DIAGNOSIS — Z515 Encounter for palliative care: Secondary | ICD-10-CM | POA: Diagnosis not present

## 2015-12-11 DIAGNOSIS — R531 Weakness: Secondary | ICD-10-CM | POA: Insufficient documentation

## 2015-12-11 DIAGNOSIS — J9601 Acute respiratory failure with hypoxia: Secondary | ICD-10-CM | POA: Diagnosis present

## 2015-12-11 DIAGNOSIS — Z7189 Other specified counseling: Secondary | ICD-10-CM | POA: Diagnosis not present

## 2015-12-11 DIAGNOSIS — I480 Paroxysmal atrial fibrillation: Secondary | ICD-10-CM | POA: Insufficient documentation

## 2015-12-11 DIAGNOSIS — J849 Interstitial pulmonary disease, unspecified: Secondary | ICD-10-CM | POA: Diagnosis present

## 2015-12-11 DIAGNOSIS — J189 Pneumonia, unspecified organism: Secondary | ICD-10-CM | POA: Diagnosis present

## 2015-12-11 DIAGNOSIS — Z951 Presence of aortocoronary bypass graft: Secondary | ICD-10-CM | POA: Diagnosis not present

## 2015-12-11 DIAGNOSIS — I4891 Unspecified atrial fibrillation: Secondary | ICD-10-CM | POA: Diagnosis not present

## 2015-12-11 DIAGNOSIS — I10 Essential (primary) hypertension: Secondary | ICD-10-CM | POA: Diagnosis not present

## 2015-12-11 DIAGNOSIS — H04129 Dry eye syndrome of unspecified lacrimal gland: Secondary | ICD-10-CM | POA: Diagnosis present

## 2015-12-11 DIAGNOSIS — R51 Headache: Secondary | ICD-10-CM

## 2015-12-11 DIAGNOSIS — R634 Abnormal weight loss: Secondary | ICD-10-CM

## 2015-12-11 DIAGNOSIS — R918 Other nonspecific abnormal finding of lung field: Secondary | ICD-10-CM | POA: Diagnosis not present

## 2015-12-11 LAB — URINALYSIS, ROUTINE W REFLEX MICROSCOPIC
BILIRUBIN URINE: NEGATIVE
Glucose, UA: NEGATIVE mg/dL
Hgb urine dipstick: NEGATIVE
KETONES UR: NEGATIVE mg/dL
LEUKOCYTES UA: NEGATIVE
NITRITE: NEGATIVE
PH: 6.5 (ref 5.0–8.0)
PROTEIN: NEGATIVE mg/dL
Specific Gravity, Urine: 1.016 (ref 1.005–1.030)

## 2015-12-11 LAB — COMPREHENSIVE METABOLIC PANEL
ALBUMIN: 2.8 g/dL — AB (ref 3.5–5.0)
ALT: 12 U/L — ABNORMAL LOW (ref 17–63)
ANION GAP: 9 (ref 5–15)
AST: 17 U/L (ref 15–41)
Alkaline Phosphatase: 126 U/L (ref 38–126)
BILIRUBIN TOTAL: 0.8 mg/dL (ref 0.3–1.2)
BUN: 14 mg/dL (ref 6–20)
CHLORIDE: 105 mmol/L (ref 101–111)
CO2: 25 mmol/L (ref 22–32)
Calcium: 8.9 mg/dL (ref 8.9–10.3)
Creatinine, Ser: 1.15 mg/dL (ref 0.61–1.24)
GFR calc Af Amer: >60 mL/min (ref >60)
GFR, EST NON AFRICAN AMERICAN: 57 mL/min — AB (ref >60)
GLUCOSE: 135 mg/dL — AB (ref 65–99)
POTASSIUM: 4.1 mmol/L (ref 3.5–5.1)
Sodium: 139 mmol/L (ref 135–145)
TOTAL PROTEIN: 6.9 g/dL (ref 6.5–8.1)

## 2015-12-11 LAB — I-STAT ARTERIAL BLOOD GAS, ED
Acid-base deficit: 4 mmol/L — ABNORMAL HIGH (ref 0.0–2.0)
BICARBONATE: 21.2 meq/L (ref 20.0–24.0)
O2 Saturation: 95 %
PCO2 ART: 36.7 mmHg (ref 35.0–45.0)
PH ART: 7.37 (ref 7.350–7.450)
PO2 ART: 76 mmHg — AB (ref 80.0–100.0)
Patient temperature: 98.5
TCO2: 22 mmol/L (ref 0–100)

## 2015-12-11 LAB — CBC
HEMATOCRIT: 34.4 % — AB (ref 39.0–52.0)
Hemoglobin: 11 g/dL — ABNORMAL LOW (ref 13.0–17.0)
MCH: 28.3 pg (ref 26.0–34.0)
MCHC: 32 g/dL (ref 30.0–36.0)
MCV: 88.4 fL (ref 78.0–100.0)
PLATELETS: 179 10*3/uL (ref 150–400)
RBC: 3.89 MIL/uL — ABNORMAL LOW (ref 4.22–5.81)
RDW: 13.3 % (ref 11.5–15.5)
WBC: 7.3 10*3/uL (ref 4.0–10.5)

## 2015-12-11 LAB — I-STAT CG4 LACTIC ACID, ED: LACTIC ACID, VENOUS: 1.45 mmol/L (ref 0.5–2.0)

## 2015-12-11 LAB — I-STAT TROPONIN, ED: Troponin i, poc: 0 ng/mL (ref 0.00–0.08)

## 2015-12-11 LAB — PROTIME-INR
INR: 4.76 — AB (ref 0.00–1.49)
PROTHROMBIN TIME: 43.3 s — AB (ref 11.6–15.2)

## 2015-12-11 MED ORDER — PANTOPRAZOLE SODIUM 40 MG PO TBEC
40.0000 mg | DELAYED_RELEASE_TABLET | Freq: Every day | ORAL | Status: DC
  Administered 2015-12-12 – 2015-12-17 (×6): 40 mg via ORAL
  Filled 2015-12-11 (×6): qty 1

## 2015-12-11 MED ORDER — IPRATROPIUM BROMIDE 0.02 % IN SOLN
0.5000 mg | Freq: Once | RESPIRATORY_TRACT | Status: AC
  Administered 2015-12-11: 0.5 mg via RESPIRATORY_TRACT
  Filled 2015-12-11: qty 2.5

## 2015-12-11 MED ORDER — DEXTROSE 5 % IV SOLN
100.0000 mg | Freq: Two times a day (BID) | INTRAVENOUS | Status: DC
  Administered 2015-12-11: 100 mg via INTRAVENOUS
  Filled 2015-12-11 (×2): qty 100

## 2015-12-11 MED ORDER — ALBUTEROL SULFATE (2.5 MG/3ML) 0.083% IN NEBU
5.0000 mg | INHALATION_SOLUTION | Freq: Once | RESPIRATORY_TRACT | Status: AC
  Administered 2015-12-11: 5 mg via RESPIRATORY_TRACT
  Filled 2015-12-11: qty 6

## 2015-12-11 MED ORDER — ACETAMINOPHEN 650 MG RE SUPP: 650.0000 mg | Freq: Four times a day (QID) | RECTAL | Status: DC | PRN

## 2015-12-11 MED ORDER — ASPIRIN EC 81 MG PO TBEC
81.0000 mg | DELAYED_RELEASE_TABLET | Freq: Every day | ORAL | Status: DC
  Administered 2015-12-12 – 2015-12-17 (×6): 81 mg via ORAL
  Filled 2015-12-11 (×6): qty 1

## 2015-12-11 MED ORDER — ACETAMINOPHEN 325 MG PO TABS: 650.0000 mg | ORAL_TABLET | Freq: Four times a day (QID) | ORAL | Status: DC | PRN

## 2015-12-11 MED ORDER — IPRATROPIUM-ALBUTEROL 0.5-2.5 (3) MG/3ML IN SOLN
3.0000 mL | Freq: Four times a day (QID) | RESPIRATORY_TRACT | Status: DC
  Administered 2015-12-12 (×2): 3 mL via RESPIRATORY_TRACT
  Filled 2015-12-11 (×2): qty 3

## 2015-12-11 MED ORDER — SODIUM CHLORIDE 0.9 % IV SOLN: INTRAVENOUS | Status: DC

## 2015-12-11 MED ORDER — ONDANSETRON HCL 4 MG PO TABS: 4.0000 mg | ORAL_TABLET | Freq: Four times a day (QID) | ORAL | Status: DC | PRN

## 2015-12-11 MED ORDER — ASPIRIN EC 81 MG PO TBEC: 81.0000 mg | DELAYED_RELEASE_TABLET | Freq: Every day | ORAL | Status: DC

## 2015-12-11 MED ORDER — SODIUM CHLORIDE 0.9% FLUSH
3.0000 mL | Freq: Two times a day (BID) | INTRAVENOUS | Status: DC
  Administered 2015-12-12 – 2015-12-16 (×8): 3 mL via INTRAVENOUS

## 2015-12-11 MED ORDER — ONDANSETRON HCL 4 MG/2ML IJ SOLN: 4.0000 mg | Freq: Four times a day (QID) | INTRAMUSCULAR | Status: DC | PRN

## 2015-12-11 MED ORDER — METOPROLOL TARTRATE 25 MG PO TABS: 25.0000 mg | ORAL_TABLET | Freq: Every day | ORAL | Status: DC

## 2015-12-11 MED ORDER — DORZOLAMIDE HCL 2 % OP SOLN
1.0000 [drp] | Freq: Two times a day (BID) | OPHTHALMIC | Status: DC
  Administered 2015-12-11 – 2015-12-13 (×4): 1 [drp] via OPHTHALMIC
  Filled 2015-12-11: qty 10

## 2015-12-11 MED ORDER — METOPROLOL TARTRATE 25 MG PO TABS
25.0000 mg | ORAL_TABLET | Freq: Every day | ORAL | Status: DC
  Administered 2015-12-12 – 2015-12-13 (×2): 25 mg via ORAL
  Filled 2015-12-11 (×2): qty 1

## 2015-12-11 MED ORDER — HYDROCODONE-ACETAMINOPHEN 5-325 MG PO TABS
1.0000 | ORAL_TABLET | ORAL | Status: DC | PRN
Start: 2015-12-11 — End: 2015-12-17

## 2015-12-11 MED ORDER — METHYLPREDNISOLONE SODIUM SUCC 125 MG IJ SOLR
60.0000 mg | Freq: Two times a day (BID) | INTRAMUSCULAR | Status: DC
  Administered 2015-12-11 – 2015-12-16 (×10): 60 mg via INTRAVENOUS
  Filled 2015-12-11 (×10): qty 2

## 2015-12-11 MED ORDER — POLYETHYLENE GLYCOL 3350 17 G PO PACK
17.0000 g | PACK | Freq: Every day | ORAL | Status: DC
  Administered 2015-12-11 – 2015-12-14 (×4): 17 g via ORAL
  Filled 2015-12-11 (×6): qty 1

## 2015-12-11 MED ORDER — IPRATROPIUM-ALBUTEROL 0.5-2.5 (3) MG/3ML IN SOLN
3.0000 mL | RESPIRATORY_TRACT | Status: DC
  Administered 2015-12-11: 3 mL via RESPIRATORY_TRACT
  Filled 2015-12-11: qty 3

## 2015-12-11 MED ORDER — LATANOPROST 0.005 % OP SOLN
1.0000 [drp] | Freq: Every day | OPHTHALMIC | Status: DC
  Administered 2015-12-11 – 2015-12-16 (×6): 1 [drp] via OPHTHALMIC
  Filled 2015-12-11 (×3): qty 2.5

## 2015-12-11 MED ORDER — ALBUTEROL (5 MG/ML) CONTINUOUS INHALATION SOLN
10.0000 mg/h | INHALATION_SOLUTION | RESPIRATORY_TRACT | Status: DC
  Filled 2015-12-11: qty 20

## 2015-12-11 MED ORDER — ALBUTEROL SULFATE (2.5 MG/3ML) 0.083% IN NEBU: 2.5000 mg | INHALATION_SOLUTION | RESPIRATORY_TRACT | Status: DC | PRN

## 2015-12-11 MED ORDER — BRINZOLAMIDE 1 % OP SUSP: 1.0000 [drp] | Freq: Two times a day (BID) | OPHTHALMIC | Status: DC

## 2015-12-11 NOTE — ED Provider Notes (Signed)
CSN: [id number]     Arrival date & time 12/11/15  1251 History   First MD Initiated Contact with Patient 12/11/15 1255     Chief Complaint  Patient presents with  . Weakness  . Shortness of Breath    HPI   [name] is a 82 y.o. male with a PMH of CAD, COPD, aortic valve replacement who presents to the ED with generalized weakness for the past 2 weeks. His wife is present at bedside, and states he has been much weaker than usual and has had increased shortness of breath for the past 2 weeks. He denies exacerbating or alleviating factors. He denies fever, chills, chest pain, abdominal pain, N/V/D/C, numbness. He notes he had a CXR done earlier this week for evaluation of shortness of breath and associated productive cough by his PCP, but was never notified of the results. He denies fever, chills, chest pain, abdominal pain, N/V/D/C, numbness.   Past Medical History  Diagnosis Date  . CORONARY ATHEROSCLEROSIS NATIVE CORONARY ARTERY 04/23/2009  . DRY EYE SYNDROME 10/09/2008  . COPD 10/09/2008  . CAD, ARTERY BYPASS GRAFT 10/09/2008  . Aortic valve disorder 12/07/2010  . Palpitations   . Emphysema   . HOH (hard of hearing)    Past Surgical History  Procedure Laterality Date  . Coronary artery bypass graft    . Aortic valve replacement     Family History  Problem Relation Age of Onset  . Heart attack Father   . Stroke Mother   . Hypertension    . Cancer    . Colon cancer Brother   . Emphysema Brother    Social History  Substance Use Topics  . Smoking status: Former Smoker -- 2.00 packs/day for 15 years    Types: Cigarettes    Quit date: 10/12/1999  . Smokeless tobacco: Never Used  . Alcohol Use: No     Review of Systems  Constitutional: Positive for fatigue.  Respiratory: Positive for cough and shortness of breath.   Cardiovascular: Negative for chest pain.  Gastrointestinal: Negative for nausea, vomiting, abdominal pain, diarrhea and constipation.  Neurological:  Positive for weakness. Negative for syncope and numbness.  All other systems reviewed and are negative.     Allergies  Penicillins  Home Medications   Prior to Admission medications   Medication Sig Start Date End Date Taking? Authorizing Provider  acetaminophen (TYLENOL) 500 MG tablet Take 500 mg by mouth every 6 (six) hours as needed. For pain    Yes Historical Provider, MD  aspirin EC 81 MG tablet Take 81 mg by mouth daily.   Yes Historical Provider, MD  brinzolamide (AZOPT) 1 % ophthalmic suspension Place 1 drop into both eyes 2 (two) times daily.    Yes Historical Provider, MD  cholecalciferol (VITAMIN D) 1000 UNITS tablet Take 1,000 Units by mouth daily.   Yes Historical Provider, MD  dorzolamide (TRUSOPT) 2 % ophthalmic solution Place 1 drop into both eyes 2 (two) times daily.   Yes Historical Provider, MD  furosemide (LASIX) 20 MG tablet Take 20 mg by mouth daily.   Yes Historical Provider, MD  latanoprost (XALATAN) 0.005 % ophthalmic solution Place 1 drop into both eyes at bedtime.   Yes Historical Provider, MD  LUMIGAN 0.01 % SOLN Place 1 drop into both eyes At bedtime. 12/15/10  Yes Historical Provider, MD  metoprolol (LOPRESSOR) 50 MG tablet TAKE ONE-HALF TABLET BY MOUTH ONCE DAILY Patient taking differently: TAKE ONE-HALF (12.'5mg'$ ) TABLET BY MOUTH ONCE DAILY  12/03/15  Yes [name], MD  omeprazole (PRILOSEC) 20 MG capsule Take 20 mg by mouth daily.   Yes Historical Provider, MD  tiotropium (SPIRIVA) 18 MCG inhalation capsule Place 18 mcg into inhaler and inhale daily.     Yes Historical Provider, MD  warfarin (COUMADIN) 5 MG tablet Take as directed by coumadin clinic Patient taking differently: Take 5 mg by mouth daily at 6 PM. Take as directed by coumadin clinic 07/25/15  Yes [name], MD    BP 167/92 mmHg  Pulse 89  Temp(Src) 97.8 F (36.6 C) (Oral)  Resp 23  SpO2 98% Physical Exam  Constitutional: He is oriented to person, place, and time. He appears  well-developed and well-nourished. No distress.  Thin male in no acute distress.  HENT:  Head: Normocephalic and atraumatic.  Right Ear: External ear normal.  Left Ear: External ear normal.  Nose: Nose normal.  Mouth/Throat: Uvula is midline, oropharynx is clear and moist and mucous membranes are normal.  Eyes: Conjunctivae, EOM and lids are normal. Pupils are equal, round, and reactive to light. Right eye exhibits no discharge. Left eye exhibits no discharge. No scleral icterus.  Neck: Normal range of motion. Neck supple.  Cardiovascular: Normal rate, regular rhythm, normal heart sounds, intact distal pulses and normal pulses.   Pulmonary/Chest: Effort normal. No respiratory distress. He has wheezes. He has no rales.  End expiratory wheezing to lung fields bilaterally.  Abdominal: Soft. Normal appearance and bowel sounds are normal. He exhibits no distension and no mass. There is no tenderness. There is no rigidity, no rebound and no guarding.  Musculoskeletal: Normal range of motion. He exhibits no edema or tenderness.  Neurological: He is alert and oriented to person, place, and time. He has normal strength. No cranial nerve deficit or sensory deficit.  Skin: Skin is warm, dry and intact. No rash noted. He is not diaphoretic. No erythema. No pallor.  Psychiatric: He has a normal mood and affect. His speech is normal and behavior is normal.  Nursing note and vitals reviewed.   ED Course  Procedures (including critical care time)  Labs Review Labs Reviewed  CBC - Abnormal; Notable for the following:    RBC 3.89 (*)    Hemoglobin 11.0 (*)    HCT 34.4 (*)    All other components within normal limits  COMPREHENSIVE METABOLIC PANEL - Abnormal; Notable for the following:    Glucose, Bld 135 (*)    Albumin 2.8 (*)    ALT 12 (*)    GFR calc non Af Amer 57 (*)    All other components within normal limits  PROTIME-INR - Abnormal; Notable for the following:    Prothrombin Time 43.3 (*)     INR 4.76 (*)    All other components within normal limits  URINALYSIS, ROUTINE W REFLEX MICROSCOPIC (NOT AT [facility])  I-STAT CG4 LACTIC ACID, ED  I-STAT TROPOININ, ED  CBG MONITORING, ED    Imaging Review Dg Chest 2 View  12/11/2015  CLINICAL DATA:  Shortness of breath, weakness and cough for a while, some blood in stool, smoker, COPD, emphysema, coronary disease post bypass, hypertension EXAM: CHEST  2 VIEW COMPARISON:  12/09/2015 FINDINGS: Normal size of cardiac silhouette post AVR. Atherosclerotic calcification aorta. Question enlarged central pulmonary arteries which could reflect pulmonary arterial hypertension. Emphysematous and bronchitic changes consistent with COPD. Chronic interstitial lung disease changes in the mid to lower lungs bilaterally similar to previous exam likely representing fibrosis. No definite  superimposed acute infiltrate, pleural effusion or pneumothorax. Bones diffusely demineralized. IMPRESSION: Post AVR. COPD changes with chronic interstitial disease/pulmonary fibrosis. No acute abnormalities. Electronically Signed   By: Lavonia Dana M.D.   On: 12/11/2015 13:55   I have personally reviewed and evaluated these images and lab results as part of my medical decision-making.   EKG Interpretation   Date/Time:  Thursday December 11 2015 13:06:21 EST Ventricular Rate:  88 PR Interval:    QRS Duration: 90 QT Interval:  358 QTC Calculation: 433 R Axis:   65 Text Interpretation:  Atrial fibrillation Minimal voltage criteria for  LVH, may be normal variant ST' \\T'$ \ T wave abnormality, consider lateral  ischemia or digitalis effect Abnormal ECG No significant change since last  tracing Confirmed by YAO  MD, DAVID ([id number]) on 12/11/2015 1:11:47 PM      MDM   Final diagnoses:  Generalized weakness  Shortness of breath    82 year old male presents with generalized weakness and increased shortness of breath x 2 weeks. His wife states he has not been able to do his normal  activities at home due to weakness. Per record review, patient evaluated 2/28 by his PCP. Noted to have follow-up with GI on Monday for further evaluation of positive hemoccult.   Patient is afebrile. Tachypneic. O2 sat stable on O2 (patient not on oxygen at home). Heart regular rhythm. End expiratory wheezing to lung fields bilaterally. No lower extremity edema. Normal neuro exam with no focal deficit.  EKG no acute ischemia. Troponin negative. Chest x-ray remarkable for COPD changes with chronic interstitial disease/pulmonary fibrosis, no acute abnormalities. CBC negative for leukocytosis, hemoglobin 11, which appears stable. CMP unremarkable. Lactic acid within normal limits. INR elevated at 4.76.  Patient discussed with and seen by Dr. Darl Householder. Will order additional breathing treatment. Given new oxygen requirement, will consult for admission. Spoke with Tye Savoy NP, patient to be admitted [facility] for further evaluation and management.  BP 167/92 mmHg  Pulse 89  Temp(Src) 97.8 F (36.6 C) (Oral)  Resp 23  SpO2 98%     Marella Chimes, PA-C 12/11/15 [place] Yao, MD 12/12/15 [phone]

## 2015-12-11 NOTE — Telephone Encounter (Signed)
New Message:  Pt's wife is wanting to speak with a nurse about the pt. She is concerned because he has had extreme fatigue for a couple of weeks. Please f/u with him

## 2015-12-11 NOTE — Telephone Encounter (Signed)
Spoke with pt's wife. She reports pt is not doing good. He is weak and can hardly stand.  He was seen earlier this week by primary care.  Wife has spoken with primary care office today and they have recommended pt go to hospital.  Wife is asking for our recommendations.  I told her pt should go to hospital as instructed by primary care.  Pt is too weak to be transported by wife so they will call EMS.

## 2015-12-11 NOTE — H&P (Signed)
[facility] History and Physical  [name] EYC:[id number] DOB: 30-Mar-1930 DOA: 12/11/2015  Referring physician: Emergency Department PCP: [name], MD   CHIEF COMPLAINT:    SOB, weakness               HPI: [name] is a 82 y.o. male with a history of CAD, aortic stenosis status post mechanical St. Jude AVR on coumadin.  He also has a history of COPD, not on home oxygen.  Son at bedside, helps with history. Over the last 2 weeks patient has had a poor appetite. The has become progressively weaker. Patient has chronic dyspnea and a chronic cough. Lately his cough is become worse, he coughed up a scant amount of blood this morning but otherwise cough is mainly nonproductive. His chronic shortness of breath became much worse yesterday. No fevers. No chest pain. Per EDP report patient initially hypoxic with sats in the nineties. Sats now in 90s after solumedrol by EMS, breathing treatments and 4 liters of 02 per [name].  Review of systems also pertinent for 2 week history of intermittent left lower quadrant discomfort. Patient has been recently constipated. He took up vegetable based laxative a few days ago with suboptimal results.   Also couple weeks ago patient developed some lower extremity edema. He was given Lasix, edema resolved. No longer taking the Lasix, son believes it caused dehydration  ED COURSE:           Labs:   Lactic acid 1.45, troponin 0.0 WBC 7.3, hemoglobin 11 INR 4.76 Albumin 2.8  EKG:    Atrial fibrillation Minimal voltage criteria for LVH, may be normal variant ST & T wave abnormality, consider lateral ischemia or digitalis effect Abnormal ECG                  Medications  ipratropium (ATROVENT) nebulizer solution 0.5 mg (0.5 mg Nebulization Given 12/11/15 1407)  albuterol (PROVENTIL) (2.5 MG/3ML) 0.083% nebulizer solution 5 mg (5 mg Nebulization Given 12/11/15 1407)    Review of Systems  HENT: Negative.   Eyes: Negative.     Respiratory: Positive for cough, hemoptysis, shortness of breath and wheezing.   Cardiovascular: Negative.   Gastrointestinal: Positive for abdominal pain and constipation.  Genitourinary: Negative.   Musculoskeletal: Negative.   Skin: Negative.   Neurological: Positive for weakness.  Endo/Heme/Allergies: Negative.      Past Medical History  Diagnosis Date  . CORONARY ATHEROSCLEROSIS NATIVE CORONARY ARTERY 04/23/2009  . DRY EYE SYNDROME 10/09/2008  . COPD 10/09/2008  . CAD, ARTERY BYPASS GRAFT 10/09/2008  . Aortic valve disorder 12/07/2010  . Palpitations   . Emphysema   . HOH (hard of hearing)    Past Surgical History  Procedure Laterality Date  . Coronary artery bypass graft    . Aortic valve replacement      SOCIAL HISTORY:  reports that he quit smoking about 16 years ago. His smoking use included Cigarettes. He has a 30 pack-year smoking history. He has never used smokeless tobacco. He reports that he does not drink alcohol or use illicit drugs. Lives:    At home with wife     Assistive devices:   None needed for ambulation.   Allergies  Allergen Reactions  . Penicillins Anaphylaxis and Rash    Family History  Problem Relation Age of Onset  . Heart attack Father   . Stroke Mother   . Hypertension    . Cancer    . Colon cancer Brother   .  Emphysema Brother     Prior to Admission medications   Medication Sig Start Date End Date Taking? Authorizing Provider  acetaminophen (TYLENOL) 500 MG tablet Take 500 mg by mouth every 6 (six) hours as needed. For pain    Yes Historical Provider, MD  aspirin EC 81 MG tablet Take 81 mg by mouth daily.   Yes Historical Provider, MD  brinzolamide (AZOPT) 1 % ophthalmic suspension Place 1 drop into both eyes 2 (two) times daily.    Yes Historical Provider, MD  cholecalciferol (VITAMIN D) 1000 UNITS tablet Take 1,000 Units by mouth daily.   Yes Historical Provider, MD  dorzolamide (TRUSOPT) 2 % ophthalmic solution Place 1 drop into  both eyes 2 (two) times daily.   Yes Historical Provider, MD  latanoprost (XALATAN) 0.005 % ophthalmic solution Place 1 drop into both eyes at bedtime.   Yes Historical Provider, MD  LUMIGAN 0.01 % SOLN Place 1 drop into both eyes At bedtime. 12/15/10  Yes Historical Provider, MD  metoprolol (LOPRESSOR) 50 MG tablet TAKE ONE-HALF TABLET BY MOUTH ONCE DAILY Patient taking differently: TAKE ONE-HALF (12.'5mg'$ ) TABLET BY MOUTH ONCE DAILY 12/03/15  Yes [name], MD  omeprazole (PRILOSEC) 20 MG capsule Take 20 mg by mouth daily.   Yes Historical Provider, MD  tiotropium (SPIRIVA) 18 MCG inhalation capsule Place 18 mcg into inhaler and inhale daily.     Yes Historical Provider, MD  warfarin (COUMADIN) 5 MG tablet Take as directed by coumadin clinic Patient taking differently: Take 5 mg by mouth daily at 6 PM. Take as directed by coumadin clinic 07/25/15  Yes [name], MD   PHYSICAL EXAM: Filed Vitals:   12/11/15 1257 12/11/15 1400 12/11/15 1408 12/11/15 1513  BP:  167/92  139/70  Pulse:  89  105  Temp:      TempSrc:      Resp:  23  22  SpO2: 90% 100% 98% 94%    Wt Readings from Last 3 Encounters:  09/01/15 66.407 kg (146 lb 6.4 oz)  11/18/14 70.308 kg (155 lb)  05/15/14 67.042 kg (147 lb 12.8 oz)    General:  Pleasant, thin white male. Appears calm and comfortable Eyes: PER, normal lids, irises & conjunctiva ENT: grossly normal hearing, lips & tongue Neck: no LAD, no masses Cardiovascular: RRR, distant heart sounds, No LE edema.  Respiratory: Respirations slightly labored. Expiratory wheezing throughout all lung fields.    Abdomen: soft, non-distended, non-tender, active bowel sounds. No obvious masses.  Skin: no rash seen on limited exam Musculoskeletal: grossly normal tone BUE/BLE Psychiatric: grossly normal mood and affect, speech fluent and appropriate Neurologic: grossly non-focal.         LABS ON ADMISSION:    Basic Metabolic Panel:  Recent Labs Lab 12/11/15 1333    NA 139  K 4.1  CL 105  CO2 25  GLUCOSE 135*  BUN 14  CREATININE 1.15  CALCIUM 8.9   Liver Function Tests:  Recent Labs Lab 12/11/15 1333  AST 17  ALT 12*  ALKPHOS 126  BILITOT 0.8  PROT 6.9  ALBUMIN 2.8*    CBC:  Recent Labs Lab 12/11/15 1333  WBC 7.3  HGB 11.0*  HCT 34.4*  MCV 88.4  PLT 179    Creatinine clearance cannot be calculated (Unknown ideal weight.)  Radiological Exams on Admission: Dg Chest 2 View  12/11/2015  CLINICAL DATA:  Shortness of breath, weakness and cough for a while, some blood in stool, smoker, COPD, emphysema, coronary  disease post bypass, hypertension EXAM: CHEST  2 VIEW COMPARISON:  12/09/2015 FINDINGS: Normal size of cardiac silhouette post AVR. Atherosclerotic calcification aorta. Question enlarged central pulmonary arteries which could reflect pulmonary arterial hypertension. Emphysematous and bronchitic changes consistent with COPD. Chronic interstitial lung disease changes in the mid to lower lungs bilaterally similar to previous exam likely representing fibrosis. No definite superimposed acute infiltrate, pleural effusion or pneumothorax. Bones diffusely demineralized. IMPRESSION: Post AVR. COPD changes with chronic interstitial disease/pulmonary fibrosis. No acute abnormalities. Electronically Signed   By: Lavonia Dana M.D.   On: 12/11/2015 13:55    ASSESSMENT / PLAN   Active Problems:   COPD exacerbation (HCC)  Acute respiratory failure with hypoxia likely secondary to COPD exacerbation.  CXR without any acute findings or suggestion of heart failure.   -Admit to Medical bed -Continue 02 Ruch to maintain sats 88-92%. Currently requiring 4 liters. Patient was not on oxygen at      home. Obtain baseline ABG now -Scheduled Duonebs Q4 hours with Albuterol q2 prn -IV Doxycycline -Dose of steroids given by EMS, will begin Solumedrol '60mg'$  IV BID and reassess need for ongoing  steroids tomorrow.   -H2 blocker or continue home PPI  Atrial  fibrillation, rate controlled.Chadvasc 4. This seems to be new onset A-fib -monitor on telemetry -on coumadin for mechanical valve -Call [facility] for cardiology consult in am  Aortic valve replacement - mechanical St. Judes. On chronic coumadin. INR supratherapeutic at 4.67. Pharmacy consulted for coumadin management  Weakness / Protein calorie malnutrition with weight loss related to chronic illness, malnutrition -PT evaluation -Dietician consult -continue Ensure for now  Left lower quadrant pain. Really more of a discomfort over the last 2 weeks. Son gives history of recent constipation which sounds unresolved despite a laxative at home. Will start patient on daily MiraLAX as tolerated. Need to exclude constipation as cause of left lower quadrant discomfort   CONSULTANTS:  None yet. Cardiology needs to be called in am for new onset Afib.    Code Status: limited code. Do not intubate DVT Prophylaxis: already anti-coagulated Family Communication:  Patient alert, oriented and understands plan of care.  Disposition Plan: Discharge to home in 24-48 hours   Time spent: 60 minutes Tye Savoy  NP [facility] Pager [phone]

## 2015-12-11 NOTE — ED Notes (Signed)
Pt from home via [place] with c/o increasing weakness x weeks now.  Pt unable to dress himself and ambulate.  On EMS arrival pt had wheezing throughout.  Given 2 duonebs, 125 mg solumedrol, and 1000 mL NS.  Pt placed on 3 L nasal cannula to bring O2 up to 98% from 90% on RA.  Hx COPD.  12 lead EKG unremarkable.  A&O.

## 2015-12-11 NOTE — Progress Notes (Signed)
ANTICOAGULATION CONSULT NOTE - Initial Consult  Pharmacy Consult for Coumadin  Indication: Mechanical valve  Allergies  Allergen Reactions  . Penicillins Anaphylaxis and Rash    Vital Signs: Temp: 97.8 F (36.6 C) (03/02 1256) Temp Source: Oral (03/02 1256) BP: 159/75 mmHg (03/02 1644) Pulse Rate: 107 (03/02 1644)  Labs:  Recent Labs  12/11/15 1333  HGB 11.0*  HCT 34.4*  PLT 179  LABPROT 43.3*  INR 4.76*  CREATININE 1.15    CrCl cannot be calculated (Unknown ideal weight.).   Medical History: Past Medical History  Diagnosis Date  . CORONARY ATHEROSCLEROSIS NATIVE CORONARY ARTERY 04/23/2009  . DRY EYE SYNDROME 10/09/2008  . COPD 10/09/2008  . CAD, ARTERY BYPASS GRAFT 10/09/2008  . Aortic valve disorder 12/07/2010  . Palpitations   . Emphysema   . HOH (hard of hearing)      Assessment: [name] admitted 12/11/2015 with SOB. Pharmacy consulted to dose coumadin for mechanical valve.  Coumadin PTA for mechanical aortic valve. INR on admit 4.76, Hgn 11, PLT 179  PTA dose = '5mg'$  daily (last dose 3/1)  Goal of Therapy:  INR 2.3 - 3.5 Monitor platelets by anticoagulation protocol: Yes   Plan:  Hold coumadin tonight  Daily CBC/INR  Monitor for s/s of bleeding   Kainan Patty C. Lennox Grumbles, PharmD Pharmacy Resident  Pager: [phone] 12/11/2015 4:52 PM

## 2015-12-12 ENCOUNTER — Encounter (HOSPITAL_COMMUNITY): Payer: Self-pay | Admitting: Physician Assistant

## 2015-12-12 ENCOUNTER — Inpatient Hospital Stay (HOSPITAL_COMMUNITY): Payer: Medicare Other

## 2015-12-12 DIAGNOSIS — R918 Other nonspecific abnormal finding of lung field: Secondary | ICD-10-CM | POA: Insufficient documentation

## 2015-12-12 DIAGNOSIS — I4891 Unspecified atrial fibrillation: Secondary | ICD-10-CM

## 2015-12-12 DIAGNOSIS — R634 Abnormal weight loss: Secondary | ICD-10-CM

## 2015-12-12 DIAGNOSIS — I480 Paroxysmal atrial fibrillation: Secondary | ICD-10-CM

## 2015-12-12 DIAGNOSIS — Z954 Presence of other heart-valve replacement: Secondary | ICD-10-CM

## 2015-12-12 LAB — CBC
HEMATOCRIT: 32.7 % — AB (ref 39.0–52.0)
Hemoglobin: 10.8 g/dL — ABNORMAL LOW (ref 13.0–17.0)
MCH: 29.2 pg (ref 26.0–34.0)
MCHC: 33 g/dL (ref 30.0–36.0)
MCV: 88.4 fL (ref 78.0–100.0)
PLATELETS: 172 10*3/uL (ref 150–400)
RBC: 3.7 MIL/uL — AB (ref 4.22–5.81)
RDW: 13.5 % (ref 11.5–15.5)
WBC: 8.8 10*3/uL (ref 4.0–10.5)

## 2015-12-12 LAB — BASIC METABOLIC PANEL
Anion gap: 9 (ref 5–15)
BUN: 15 mg/dL (ref 6–20)
CHLORIDE: 104 mmol/L (ref 101–111)
CO2: 24 mmol/L (ref 22–32)
Calcium: 9 mg/dL (ref 8.9–10.3)
Creatinine, Ser: 1.11 mg/dL (ref 0.61–1.24)
GFR calc Af Amer: >60 mL/min (ref >60)
GFR, EST NON AFRICAN AMERICAN: 59 mL/min — AB (ref >60)
Glucose, Bld: 144 mg/dL — ABNORMAL HIGH (ref 65–99)
Potassium: 4.1 mmol/L (ref 3.5–5.1)
Sodium: 137 mmol/L (ref 135–145)

## 2015-12-12 LAB — PROTIME-INR
INR: 3.98 — AB (ref 0.00–1.49)
Prothrombin Time: 37.9 seconds — ABNORMAL HIGH (ref 11.6–15.2)

## 2015-12-12 MED ORDER — IPRATROPIUM-ALBUTEROL 0.5-2.5 (3) MG/3ML IN SOLN
3.0000 mL | Freq: Three times a day (TID) | RESPIRATORY_TRACT | Status: DC
  Administered 2015-12-12 – 2015-12-17 (×15): 3 mL via RESPIRATORY_TRACT
  Filled 2015-12-12 (×14): qty 3

## 2015-12-12 MED ORDER — FUROSEMIDE 10 MG/ML IJ SOLN
40.0000 mg | Freq: Once | INTRAMUSCULAR | Status: AC
  Administered 2015-12-12: 40 mg via INTRAVENOUS
  Filled 2015-12-12: qty 4

## 2015-12-12 MED ORDER — WARFARIN SODIUM 2 MG PO TABS
2.0000 mg | ORAL_TABLET | Freq: Once | ORAL | Status: AC
  Administered 2015-12-12: 2 mg via ORAL
  Filled 2015-12-12: qty 1

## 2015-12-12 MED ORDER — FUROSEMIDE 10 MG/ML IJ SOLN
INTRAMUSCULAR | Status: AC
  Administered 2015-12-12: 09:00:00
  Filled 2015-12-12: qty 4

## 2015-12-12 MED ORDER — LEVOFLOXACIN IN D5W 500 MG/100ML IV SOLN
500.0000 mg | INTRAVENOUS | Status: DC
  Administered 2015-12-12 – 2015-12-14 (×3): 500 mg via INTRAVENOUS
  Filled 2015-12-12 (×3): qty 100

## 2015-12-12 MED ORDER — LEVALBUTEROL HCL 1.25 MG/0.5ML IN NEBU: 1.2500 mg | INHALATION_SOLUTION | Freq: Four times a day (QID) | RESPIRATORY_TRACT | Status: DC | PRN

## 2015-12-12 MED ORDER — ENSURE ENLIVE PO LIQD
237.0000 mL | Freq: Two times a day (BID) | ORAL | Status: DC
  Administered 2015-12-13 – 2015-12-16 (×8): 237 mL via ORAL

## 2015-12-12 MED ORDER — WARFARIN - PHARMACIST DOSING INPATIENT: Freq: Every day | Status: DC

## 2015-12-12 NOTE — Consult Note (Signed)
CARDIOLOGY CONSULT NOTE   Patient ID: [name] MRN: [id number] DOB/AGE: 01-24-1930 82 y.o.  Admit date: 12/11/2015  Primary Physician   [name], MD Primary Cardiologist  [name] Reason for Consultation: Atrial fib    WVP:[id number] [name] is a 82 y.o. year old male with a history of aortic stenosis, s/p mechanical St Jude AVR + 1v CABG (S-RCA) in 2001, ILD, coumadin rx. Myoview (12/06): Normal, EF 70%. Echo (2015): EF 55-60%, mechanical AVR ok with mean gradient 14 mmHg. PN saw 08/2015 and pt doing well.   Pt admitted 03/02 w/ SOB and rapid afib, cards asked to see.   Pt has been struggling with respiratory issues (acute on chronic) for 3 weeks. He saw [name] with cough and LE edema. He was requested to walk more, not sit so much. 4 days ago, pt saw IM person again. Pt had CXR, told OK and had blood work and was told that was OK as well (no details available).   Yesterday am, pt was breathing harder, no LE edema. No fevers, cough minimal hemoptysis. Pt felt bad and could not explain exactly why. He did note that he could tell his heart rate was up when he was very SOB or upset. EMS was called and pt was in respiratory failure with O2 sats in the 90s and rapid atrial fib.     He got 2 doses of IV Lasix, nebs, Levaquin and is breathing some better. Pt is coughing less today and his resting SOB has improved. Still very dyspneic with minimal exertion. No chest pain. No current awareness of rapid or irregular HR.  Pt recently had heme-positive stools, is set up for GI eval on 03/06.  Past Medical History  Diagnosis Date  . CORONARY ATHEROSCLEROSIS NATIVE CORONARY ARTERY 04/23/2009  . DRY EYE SYNDROME 10/09/2008  . COPD 10/09/2008  . CAD, ARTERY BYPASS GRAFT 10/09/2008  . Aortic valve disorder 12/07/2010  . Palpitations   . Emphysema   . HOH (hard of hearing)      Past Surgical History  Procedure Laterality Date  . Coronary artery bypass graft  2001     SVG-RCA  . Aortic valve replacement  2001     23 mm St. Jude mechanical valve   Allergies  Allergen Reactions  . Penicillins Anaphylaxis and Rash    I have reviewed the patient's current medications . aspirin EC  81 mg Oral Daily  . dorzolamide  1 drop Both Eyes BID  . ipratropium-albuterol  3 mL Nebulization TID  . latanoprost  1 drop Both Eyes QHS  . levofloxacin (LEVAQUIN) IV  500 mg Intravenous Q24H  . methylPREDNISolone (SOLU-MEDROL) injection  60 mg Intravenous Q12H  . metoprolol  25 mg Oral Daily  . pantoprazole  40 mg Oral Daily  . polyethylene glycol  17 g Oral Daily  . sodium chloride flush  3 mL Intravenous Q12H  . warfarin  2 mg Oral ONCE-1800  . Warfarin - Pharmacist Dosing Inpatient   Does not apply q1800     acetaminophen **OR** acetaminophen, HYDROcodone-acetaminophen, levalbuterol, ondansetron **OR** ondansetron (ZOFRAN) IV  Prior to Admission medications   Medication Sig Start Date End Date Taking? Authorizing Provider  acetaminophen (TYLENOL) 500 MG tablet Take 500 mg by mouth every 6 (six) hours as needed. For pain    Yes Historical Provider, MD  aspirin EC 81 MG tablet Take 81 mg by mouth daily.   Yes Historical Provider, MD  cholecalciferol (  VITAMIN [name]) 1000 UNITS tablet Take 1,000 Units by mouth daily.   Yes Historical Provider, MD  dorzolamide (TRUSOPT) 2 % ophthalmic solution Place 1 drop into both eyes 2 (two) times daily.   Yes Historical Provider, MD  furosemide (LASIX) 20 MG tablet Take 20 mg by mouth daily.   Yes Historical Provider, MD  latanoprost (XALATAN) 0.005 % ophthalmic solution Place 1 drop into both eyes at bedtime.   Yes Historical Provider, MD  LUMIGAN 0.01 % SOLN Place 1 drop into both eyes At bedtime. 12/15/10  Yes Historical Provider, MD  metoprolol (LOPRESSOR) 50 MG tablet TAKE ONE-HALF TABLET BY MOUTH ONCE DAILY Patient taking differently: TAKE ONE-HALF (12.'5mg'$ ) TABLET BY MOUTH ONCE DAILY 12/03/15  Yes [name], MD  omeprazole  (PRILOSEC) 20 MG capsule Take 20 mg by mouth daily.   Yes Historical Provider, MD  tiotropium (SPIRIVA) 18 MCG inhalation capsule Place 18 mcg into inhaler and inhale daily.     Yes Historical Provider, MD  warfarin (COUMADIN) 5 MG tablet Take as directed by coumadin clinic Patient taking differently: Take 5 mg by mouth daily at 6 PM. Take as directed by coumadin clinic 07/25/15  Yes [name], MD  brinzolamide (AZOPT) 1 % ophthalmic suspension Place 1 drop into both eyes 2 (two) times daily. Reported on 12/11/2015    Historical Provider, MD     Social History   Social History  . Marital Status: Married    Spouse Name: N/A  . Number of Children: N/A  . Years of Education: N/A   Occupational History  . retired    Social History Main Topics  . Smoking status: Former Smoker -- 2.00 packs/day for 15 years    Types: Cigarettes    Quit date: 10/12/1999  . Smokeless tobacco: Never Used  . Alcohol Use: No  . Drug Use: No  . Sexual Activity: No   Other Topics Concern  . Not on file   Social History Narrative   Pt lives with wife. Married since July 1957.  Son lives nearby.    Family Status  Relation Status Death Age  . Father Deceased   . Mother Deceased    Family History  Problem Relation Age of Onset  . Heart attack Father   . Stroke Mother   . Hypertension    . Cancer    . Colon cancer Brother   . Emphysema Brother      ROS:  Full 14 point review of systems complete and found to be negative unless listed above.  Physical Exam: Blood pressure 169/114, pulse 94, temperature 97.8 F (36.6 C), temperature source Oral, resp. rate 22, SpO2 100 %.  General: Well developed, well nourished, male in no acute distress Head: Eyes PERRLA, No xanthomas.   Normocephalic and atraumatic, oropharynx without edema or exudate. Dentition: poor Lungs: bibasilar rales Heart: Heart irregular rate and rhythm with S1, S2; 2/6 murmur, valve click noted (resp are noisy). pulses are 2+ all 4  extrem.   Neck: No carotid bruits. No lymphadenopathy.  JVD to jaw. Abdomen: Bowel sounds present, abdomen soft and non-tender without masses or hernias noted. Msk:  No spine or cva tenderness. Generalized weakness, no joint deformities or effusions. Extremities: No clubbing or cyanosis. no edema.  Neuro: Alert and oriented X 2. Memory of previous events is poor. No focal deficits noted. Psych:  Good affect, responds appropriately Skin: No rashes or lesions noted.  Labs:   Lab Results  Component Value Date  WBC 8.8 12/12/2015   HGB 10.8* 12/12/2015   HCT 32.7* 12/12/2015   MCV 88.4 12/12/2015   PLT 172 12/12/2015    Recent Labs  12/12/15 0637  INR 3.98*     Recent Labs Lab 12/11/15 1333 12/12/15 0637  NA 139 137  K 4.1 4.1  CL 105 104  CO2 25 24  BUN 14 15  CREATININE 1.15 1.11  CALCIUM 8.9 9.0  PROT 6.9  --   BILITOT 0.8  --   ALKPHOS 126  --   ALT 12*  --   AST 17  --   GLUCOSE 135* 144*  ALBUMIN 2.8*  --     Recent Labs  12/11/15 1338  TROPIPOC 0.00    ------------------------------------------------------------------- 12/12/2015 ECHO Study Conclusions - Left ventricle: The cavity size was normal. Wall thickness was normal. Systolic function was normal. The estimated ejection fraction was in the range of 60% to 65%.  Impressions:  - This is a very technically difficult echo with imcomplete view. Contrast was not used. The LV function appears to be well preserved but I would suggest Definity contrast if speficic wall motion evaluation is needed.   03/21/2014 ECHO - Left ventricle: Prominent calcified false tendon in the LV cavity. The cavity size was normal. There was mild focal basal hypertrophy of the septum. Systolic function was normal. The estimated ejection fraction was in the range of 55% to 60%. Wall motion was normal; there were no regional wall motion abnormalities. - Aortic valve: A St. Jude Medical mechanical  prosthesis was present and functioning normally. - Mitral valve: Calcified annulus. Mild diffuse calcification, with mild involvement of chords. - Tricuspid valve: There was trivial regurgitation.   ECG:  12/12/2015 SR w/ PACs  Telemetry:  SR, ST. Slightly irregular with narrow complexes seen without P waves, PACs>>?WAP  Radiology:  Dg Chest 2 View 12/11/2015  CLINICAL DATA:  Shortness of breath, weakness and cough for a while, some blood in stool, smoker, COPD, emphysema, coronary disease post bypass, hypertension EXAM: CHEST  2 VIEW COMPARISON:  12/09/2015 FINDINGS: Normal size of cardiac silhouette post AVR. Atherosclerotic calcification aorta. Question enlarged central pulmonary arteries which could reflect pulmonary arterial hypertension. Emphysematous and bronchitic changes consistent with COPD. Chronic interstitial lung disease changes in the mid to lower lungs bilaterally similar to previous exam likely representing fibrosis. No definite superimposed acute infiltrate, pleural effusion or pneumothorax. Bones diffusely demineralized. IMPRESSION: Post AVR. COPD changes with chronic interstitial disease/pulmonary fibrosis. No acute abnormalities. Electronically Signed   By: Lavonia Dana M.[name].   On: 12/11/2015 13:55    ASSESSMENT AND PLAN:   The patient was seen today by [name] Claiborne Billings, the patient evaluated and the data reviewed.  Active Problems:   S/P AVR - echo to eval valve - continue coumadin    COPD exacerbation (HCC)   Acute respiratory failure with hypoxia (HCC) - per IM    Weakness - per IM - acute on chronic    Left lower quadrant pain - per IM - has GI appt on Monday - per wife, recent heme + stool    Weakness generalized - per IM    Weight loss - no weights this admit - last weight in the system is 08/2015, 146 lbs.    Atrial fib - MD to review ECGs, ?WAP as narrow complexes are frequently seen without P waves, but many others have clear P waves. Rate is OK.     Chronic anticoagulation - was on coumadin PTA 2nd  AVR - INR slightly elevated on admission - CHADS2VASC=5  Signed: Lenoard Aden 12/12/2015 3:16 PM Beeper [phone]  Co-Sign MD  Patient seen and examined. Agree with assessment and plan.  [name] is an 82 year old Caucasian male who has a history of CAD and aortic valve stenosis and in 2001 underwent single-vessel CABG to his RCA and mechanical St. Jude aortic valve replacement.  He has been on Coumadin anticoagulation therapy.  In 2013  His echo showed hyperdynamic LV function.  He had a mean gradient of 11.  Follow-up echo in June 2015 showed an EF of 55-60%.  His St. Jude mechanical prosthesis was functioning normally.  There was mitral annular calcification and sub-mitral calcification.  Patient has a history of dyspnea on exertion.  According to his wife, the patient has had increasing shortness of breath for the past 3-5 weeks, but had but this has become more pronounced over the last 2 weeks.  He was admitted yesterday with increasing respiratory distress.  He was treated with IV Lasix, nebulizer, Levaquin, and doxycycline.  He was found to be in atrial fibrillation which may be new onset.  His ECG from yesterday reveals atrial fibrillation at 88 bpm with ST-T changes which may be suggestive of possible dig effect.  Chest x-ray revealed COPD changes with chronic interstitial disease/prominent fibrosis without acute abnormalities.  His ECG today shows restoration of sinus rhythm at 95 bpm with sinus arrhythmia and  LVH repolarization changes.  A 2-[name] echo Doppler study which was just recently completed was technically very difficult of which continue to show normal systolic function.  The study was of poor quality and imaging was suboptimal to assess his mechanical aortic valve and chamber dimensions.  He has been on Coumadin anticoagulation therapy with his St. Jude mechanical prosthesis providing anticoagulation for his PAF.  CT  scan of his chest demonstrates a 4.3 cm right infrahilar/central right lower lobe mass which is very suspicious for primary bronchogenic neoplasm.  There also were bilateral lower lobe pulmonary nodules, suspicious for metastases.  Bronchoscopy for tissue confirmation was recommended.  Presently, the patient denies chest pressure, anginal symptoms.  He is hard of hearing.  He had decreased breath sounds diffusely.  Rhythm was regular with a 1 to 2/6 systolic murmur and valve click.  There is no HJR.  Sounds were present.  There was no clubbing, cyanosis or edema.  At present, the patient appears to have converted out of atrial fibrillation.  I suspect his atrial fibrillation was precipitated by his COPD exacerbation.  He is not having anginal symptomatology.  He is on metoprolol for his PAF and CAD.  Workup will be necessary for his suspicious lung lesions and Coumadin will be needed to be held if a  potential biopsy is to be done  If the patient requires surgery noninvasive imaging to assess his CAD would be worthwhile.    Troy Sine, MD, [facility] 12/12/2015 4:48 PM

## 2015-12-12 NOTE — Progress Notes (Signed)
ANTICOAGULATION CONSULT NOTE  Pharmacy Consult for Coumadin  Indication: Mechanical valve  Allergies  Allergen Reactions  . Penicillins Anaphylaxis and Rash    Vital Signs: Temp: 98.2 F (36.8 C) (03/03 0659) Temp Source: Oral (03/03 0659) BP: 164/99 mmHg (03/03 0700) Pulse Rate: 99 (03/03 0700)  Labs:  Recent Labs  12/11/15 1333 12/12/15 0637  HGB 11.0* 10.8*  HCT 34.4* 32.7*  PLT 179 172  LABPROT 43.3* 37.9*  INR 4.76* 3.98*  CREATININE 1.15 1.11    CrCl cannot be calculated (Unknown ideal weight.).   Medical History: Past Medical History  Diagnosis Date  . CORONARY ATHEROSCLEROSIS NATIVE CORONARY ARTERY 04/23/2009  . DRY EYE SYNDROME 10/09/2008  . COPD 10/09/2008  . CAD, ARTERY BYPASS GRAFT 10/09/2008  . Aortic valve disorder 12/07/2010  . Palpitations   . Emphysema   . HOH (hard of hearing)      Assessment: 53 YOM admitted 12/11/2015 with SOB. Pharmacy consulted to dose coumadin for mechanical valve.  Coumadin PTA for mechanical aortic valve. INR on admit 4.76 - now down to 3.98 after held 3/2- goal 2.5-3.5 CBC stable, no bleeding.  PTA dose = '5mg'$  daily (last dose 3/1)  Goal of Therapy:  INR 2.3 - 3.5 Monitor platelets by anticoagulation protocol: Yes   Plan:  coumadin 2 mg po x 1 dose tonight  Daily CBC/INR  Monitor for s/s of bleeding  Eudelia Bunch, Pharm.D. [phone] 12/12/2015 9:24 AM

## 2015-12-12 NOTE — Progress Notes (Signed)
Initial Nutrition Assessment  DOCUMENTATION CODES:   Severe malnutrition in context of chronic illness  INTERVENTION:  -Provide Ensure ENlive BID, 350 kcal, 20 grams of protein per serving -Recommend obtaining current weight  NUTRITION DIAGNOSIS:   Malnutrition related to chronic illness as evidenced by severe depletion of body fat, severe depletion of muscle mass.  GOAL:   Patient will meet greater than or equal to 90% of their needs  MONITOR:   PO intake, Supplement acceptance, Labs, Weight trends  REASON FOR ASSESSMENT:   Consult Assessment of nutrition requirement/status  ASSESSMENT:   Pt with a history of CAD, aortic stenosis status post mechanical St. Jude AVR on coumadin. He also has a history of COPD, not on home oxygen. Son at bedside, helps with history. Over the last 2 weeks patient has had a poor appetite. The has become progressively weaker. Patient has chronic dyspnea and a chronic cough. Lately his cough is become worse, he coughed up a scant amount of blood this morning but otherwise cough is mainly nonproductive. His chronic shortness of breath became much worse yesterday. No fevers. No chest pain. Per EDP report patient initially hypoxic with sats in the nineties. Sats now in 90s after solumedrol by EMS, breathing treatments and 4 liters of 02 per [place].  Pt seen for consult on nutrition assessment. Pt states his appetite is good. Per pt's wife reports his appetite fluctuates day to day. This morning the pt reports having sausage, pancakes, and fruit. Per chart pt only consumed 60% of this meal. For lunch pt reports having potatoes, carrots, strawberry short cake,and fish. No PO per chart was reported. PTA pt consumes 3 meals daily and Boost 1-2 times daily. Will provide Ensure BID to help meet nutritional needs, does not prefer vanilla flavors   Per wife pt has lost 5 lb within 2 weeks. No weight recorded for this admission, unable to determine per chart.  Spoke  with RN, she reports a fair intake and did consume a Ensure today.  Conducted nutrition focused physical exam, identified severe muscle and fat wasting.   Medications reviewed. Labs reviewed.  Diet Order:  Diet Heart Room service appropriate?: Yes; Fluid consistency:: Thin  Skin:  Reviewed, no issues  Last BM:  12/12/2015  Height:   Ht Readings from Last 1 Encounters:  09/01/15 '6\' 2"'$  (1.88 m)    Weight:   Wt Readings from Last 1 Encounters:  09/01/15 146 lb 6.4 oz (66.407 kg)    Ideal Body Weight:  86.3 kg  BMI:  There is no weight on file to calculate BMI.  Estimated Nutritional Needs:   Kcal:  1700-1900  Protein:  80-95 grams  Fluid:  1.7-1.9 L  EDUCATION NEEDS:   No education needs identified at this time  [place], Dietetic Intern Pager: [phone]

## 2015-12-12 NOTE — Care Management Note (Addendum)
Case Management Note  Patient Details  Name: [name] MRN: [id number] Date of Birth: 20-Mar-1930  Subjective/Objective:           Admitted with COPD exacerbation. From home with wife. Independent with ADL's.DME: cane,walker, shower chair. PCP: Dr.Nylan   Action/Plan: Return to home when medically stable. CM to f/u with disposition needs.  Expected Discharge Date:                  Expected Discharge Plan:  Home/Self Care  In-House Referral:     Discharge planning Services  CM Consult  Post Acute Care Choice:    Choice offered to:     DME Arranged:    DME Agency:     HH Arranged:    HH Agency:     Status of Service:  In process, will continue to follow  Medicare Important Message Given:    Date Medicare IM Given:    Medicare IM give by:    Date Additional Medicare IM Given:    Additional Medicare Important Message give by:     If discussed at [place] of Stay Meetings, dates discussed:    Additional Comments: COPD Gold Protocol  Whitman Hero [place], [place] [phone] 12/12/2015, 4:19 PM

## 2015-12-12 NOTE — Evaluation (Signed)
Physical Therapy Evaluation Patient Details Name: [name] MRN: [id number] DOB: December 28, 1929 Today's Date: 12/12/2015   History of Present Illness  Pt admitted with acute respiratory failure with hypoxia due to COPD exacerbation, new onset afib. PMH: CAD, COPD, AVR, CABG.  Clinical Impression  Pt admitted with above diagnosis and presents to PT with functional limitations due to deficits listed below (See PT problem list). Pt needs skilled PT to maximize independence and safety to allow discharge to home with wife.      Follow Up Recommendations Home health PT;Supervision for mobility/OOB    Equipment Recommendations  Other (comment) (Possible rollator)    Recommendations for Other Services       Precautions / Restrictions Precautions Precautions: Fall      Mobility  Bed Mobility Overal bed mobility: Modified Independent                Transfers Overall transfer level: Needs assistance Equipment used: Straight cane Transfers: Sit to/from Stand Sit to Stand: Min guard         General transfer comment: Assist for balance and safety  Ambulation/Gait Ambulation/Gait assistance: Min guard Ambulation Distance (Feet): 12 Feet (x 2) Assistive device: Straight cane Gait Pattern/deviations: Step-through pattern;Decreased stride length;Trunk flexed Gait velocity: decr Gait velocity interpretation: <1.8 ft/sec, indicative of risk for recurrent falls General Gait Details: Assist for balance. Pt with dyspnea 3-4/4 on 3L of O2  Stairs            Wheelchair Mobility    Modified Rankin (Stroke Patients Only)       Balance Overall balance assessment: Needs assistance Sitting-balance support: No upper extremity supported;Feet supported Sitting balance-Leahy Scale: Good     Standing balance support: No upper extremity supported;During functional activity Standing balance-Leahy Scale: Fair                               Pertinent  Vitals/Pain Pain Assessment: No/denies pain    Home Living Family/patient expects to be discharged to:: Private residence Living Arrangements: Spouse/significant other Available Help at Discharge: Family;Available 24 hours/day Type of Home: House Home Access: Stairs to enter Entrance Stairs-Rails: Right Entrance Stairs-Number of Steps: 2 Home Layout: One level Home Equipment: Shower seat;Cane - single point      Prior Function Level of Independence: Independent with assistive device(s)         Comments: pt used cane when walking outside, drives only on their property     Hand Dominance   Dominant Hand: Right    Extremity/Trunk Assessment   Upper Extremity Assessment: Defer to OT evaluation           Lower Extremity Assessment: Generalized weakness         Communication   Communication: HOH (with aids)  Cognition Arousal/Alertness: Awake/alert Behavior During Therapy: Flat affect Overall Cognitive Status: History of cognitive impairments - at baseline       Memory: Decreased short-term memory              General Comments      Exercises        Assessment/Plan    PT Assessment Patient needs continued PT services  PT Diagnosis Difficulty walking;Generalized weakness   PT Problem List Decreased strength;Decreased activity tolerance;Decreased balance;Decreased mobility;Cardiopulmonary status limiting activity  PT Treatment Interventions DME instruction;Gait training;Functional mobility training;Therapeutic activities;Therapeutic exercise;Balance training;Patient/family education   PT Goals (Current goals can be found in the Care Plan section) Acute Rehab  PT Goals Patient Stated Goal: get my breath PT Goal Formulation: With patient Time For Goal Achievement: 12/19/15 Potential to Achieve Goals: Good    Frequency Min 3X/week   Barriers to discharge        Co-evaluation               End of Session Equipment Utilized During  Treatment: Gait belt;Oxygen Activity Tolerance: Patient limited by fatigue;Treatment limited secondary to medical complications (Comment) (dyspnea) Patient left: in bed (being taken for a test) Nurse Communication: Mobility status         Time: 2929-0903 PT Time Calculation (min) (ACUTE ONLY): 17 min   Charges:   PT Evaluation $PT Eval Moderate Complexity: 1 Procedure     PT G Codes:        Trevione Wert 12/14/2015, 11:39 AM Suanne Marker PT [phone]

## 2015-12-12 NOTE — Progress Notes (Signed)
[facility] PROGRESS NOTE  DANZIG MACGREGOR KDT:[id number] DOB: 1930-06-03 DOA: 12/11/2015 PCP: [name], MD  Length of stay: 1   Assessment/Plan: Active Problems:   S/P AVR   COPD exacerbation ([place])   Acute respiratory failure with hypoxia (HCC)   Weakness   Left lower quadrant pain   Weakness generalized   Weight loss    Brief summary  82 y.o. male with a hx of CAD, aortic stenosis, s/p mechanical [employer] AVR + 1v CABG (S-RCA) in 2001, ILD, coumadin rx. Myoview (12/06): Normal, EF 70%. Echo (02/2012): EF 60-65%, mechanical AVR ok with mean gradient 11 mmHg. Last seen by me in 03/2013. He continues to note chronic DOE. This is unchanged. Previously with NYHA Class 2b symptoms. No orthopnea, PND, edema. Presents to the ER with shortness of breath and weakness, patient noted getting weaker and having more shortness of breath on Tuesday, also has a nonproductive cough. Chest x-ray shows pulmonary fibrosis and chronic interstitial disease. EKG shows atrial fibrillation  Assessment and plan Acute respiratory failure with hypoxia likely secondary to COPD exacerbation. CXR without any acute findings or suggestion of heart failure.  Continue telemetry -Continue 02 [place] to maintain sats 88-92%. Currently requiring 4 liters. Patient was not on oxygen at home. ABG shows pH of 7.37, PCO2 36.7, PO2 of 76.0 -Scheduled Duonebs Q4 hours with Albuterol q2 prn  initiated on Levaquin for COPD exacerbation Continue Solumedrol '60mg'$  IV BID and reassess need for ongoing steroids tomorrow.  -H2 blocker or continue home PPI CT of the chest to rule out interstitial lung disease  Atrial fibrillation, rate controlled.Chadvasc 4. This seems to be new onset A-fib or paroxysmal at best Consultants cardiology, 2-D echo -on coumadin for mechanical valve, goal 2.5-3.5    Aortic valve replacement - mechanical [employer]. On chronic coumadin. INR supratherapeutic . Pharmacy  consulted for coumadin management  Weakness / Protein calorie malnutrition with weight loss related to chronic illness, malnutrition -PT evaluation -Dietician consult -continue Ensure for now  Left lower quadrant pain. Really more of a discomfort over the last 2 weeks. Son gives history of recent constipation which sounds unresolved despite a laxative at home. Will start patient on daily MiraLAX as tolerated. Need to exclude constipation as cause of left lower quadrant discomfort. UA negative     DVT prophylaxsis Coumadin  Code Status:      Code Status Orders     Partial code    Start     Ordered     Family Communication: Discussed in detail with the patient, all imaging results, lab results explained to the patient   Disposition Plan: Cardiology consultation anticipate discharge in one to 2 days     Consultants:  Cardiology  Procedures:  None  Antibiotics: Anti-infectives    Start     Dose/Rate Route Frequency Ordered Stop   12/12/15 0830  levofloxacin (LEVAQUIN) IVPB 500 mg     500 mg 100 mL/hr over 60 Minutes Intravenous Every 24 hours 12/12/15 0734     12/11/15 2000  doxycycline (VIBRAMYCIN) 100 mg in dextrose 5 % 250 mL IVPB  Status:  Discontinued     100 mg 125 mL/hr over 120 Minutes Intravenous Every 12 hours 12/11/15 1741 12/12/15 0734         HPI/Subjective: Patient eating breakfast and sitting on the edge of the bed, comfortable denies any shortness of breath,  Objective: Filed Vitals:   12/12/15 0210 12/12/15 0659 12/12/15 0700 12/12/15  0842  BP:  181/89 164/99   Pulse:  96 99   Temp:  98.2 F (36.8 C)    TempSrc:  Oral    Resp:  20    SpO2: 98% 96%  99%    Intake/Output Summary (Last 24 hours) at 12/12/15 1112 Last data filed at 12/12/15 1019  Gross per 24 hour  Intake    640 ml  Output    650 ml  Net    -10 ml    Exam:  General: No acute respiratory distress Lungs: Clear to auscultation bilaterally without wheezes or  crackles Cardiovascular: Irregularly irregular, without murmur gallop or rub normal S1 and S2 Abdomen: Nontender, nondistended, soft, bowel sounds positive, no rebound, no ascites, no appreciable mass Extremities: No significant cyanosis, clubbing, or edema bilateral lower extremities     Data Review   Micro Results No results found for this or any previous visit (from the past 240 hour(s)).  Radiology Reports Dg Chest 2 View  12/11/2015  CLINICAL DATA:  Shortness of breath, weakness and cough for a while, some blood in stool, smoker, COPD, emphysema, coronary disease post bypass, hypertension EXAM: CHEST  2 VIEW COMPARISON:  12/09/2015 FINDINGS: Normal size of cardiac silhouette post AVR. Atherosclerotic calcification aorta. Question enlarged central pulmonary arteries which could reflect pulmonary arterial hypertension. Emphysematous and bronchitic changes consistent with COPD. Chronic interstitial lung disease changes in the mid to lower lungs bilaterally similar to previous exam likely representing fibrosis. No definite superimposed acute infiltrate, pleural effusion or pneumothorax. Bones diffusely demineralized. IMPRESSION: Post AVR. COPD changes with chronic interstitial disease/pulmonary fibrosis. No acute abnormalities. Electronically Signed   By: Lavonia Dana M.D.   On: 12/11/2015 13:55   Dg Chest 2 View  12/09/2015  CLINICAL DATA:  Weight loss, severe emphysema and pulmonary fibrosis. Status post aortic valve replacement. EXAM: CHEST  2 VIEW COMPARISON:  60 in 2015, 03/21/2014 FINDINGS: Chronic background severe emphysema pattern and mid and lower lung interstitial fibrosis. Interval resolution of the previous right upper lobe spiculated airspace opacity, compatible with infectious/inflammatory process. No current superimposed edema or pneumonia. No effusion or pneumothorax. Trachea midline. Prior coronary bypass also evident as well as aortic valve replacement. Atherosclerosis of the  aorta. Chronic appearing mid thoracic mild compression fracture. IMPRESSION: Chronic background severe emphysema and mid and lower lung interstitial fibrosis. Interval resolution of the right upper lobe focal spiculated airspace process. Suspect infectious/inflammatory process. No current acute process. Electronically Signed   By: Jerilynn Mages.  Shick M.D.   On: 12/09/2015 15:13     CBC  Recent Labs Lab 12/11/15 1333 12/12/15 0637  WBC 7.3 8.8  HGB 11.0* 10.8*  HCT 34.4* 32.7*  PLT 179 172  MCV 88.4 88.4  MCH 28.3 29.2  MCHC 32.0 33.0  RDW 13.3 13.5    Chemistries   Recent Labs Lab 12/11/15 1333 12/12/15 0637  NA 139 137  K 4.1 4.1  CL 105 104  CO2 25 24  GLUCOSE 135* 144*  BUN 14 15  CREATININE 1.15 1.11  CALCIUM 8.9 9.0  AST 17  --   ALT 12*  --   ALKPHOS 126  --   BILITOT 0.8  --    ------------------------------------------------------------------------------------------------------------------ CrCl cannot be calculated (Unknown ideal weight.). ------------------------------------------------------------------------------------------------------------------ No results for input(s): HGBA1C in the last 72 hours. ------------------------------------------------------------------------------------------------------------------ No results for input(s): CHOL, HDL, LDLCALC, TRIG, CHOLHDL, LDLDIRECT in the last 72 hours. ------------------------------------------------------------------------------------------------------------------ No results for input(s): TSH, T4TOTAL, T3FREE, THYROIDAB in the last 72 hours.  Invalid input(s): FREET3 ------------------------------------------------------------------------------------------------------------------ No results for input(s): VITAMINB12, FOLATE, FERRITIN, TIBC, IRON, RETICCTPCT in the last 72 hours.  Coagulation profile  Recent Labs Lab 12/11/15 1333 12/12/15 0637  INR 4.76* 3.98*    No results for input(s): DDIMER in the  last 72 hours.  Cardiac Enzymes No results for input(s): CKMB, TROPONINI, MYOGLOBIN in the last 168 hours.  Invalid input(s): CK ------------------------------------------------------------------------------------------------------------------ Invalid input(s): POCBNP   CBG: No results for input(s): GLUCAP in the last 168 hours.     Studies: Dg Chest 2 View  12/11/2015  CLINICAL DATA:  Shortness of breath, weakness and cough for a while, some blood in stool, smoker, COPD, emphysema, coronary disease post bypass, hypertension EXAM: CHEST  2 VIEW COMPARISON:  12/09/2015 FINDINGS: Normal size of cardiac silhouette post AVR. Atherosclerotic calcification aorta. Question enlarged central pulmonary arteries which could reflect pulmonary arterial hypertension. Emphysematous and bronchitic changes consistent with COPD. Chronic interstitial lung disease changes in the mid to lower lungs bilaterally similar to previous exam likely representing fibrosis. No definite superimposed acute infiltrate, pleural effusion or pneumothorax. Bones diffusely demineralized. IMPRESSION: Post AVR. COPD changes with chronic interstitial disease/pulmonary fibrosis. No acute abnormalities. Electronically Signed   By: Lavonia Dana M.D.   On: 12/11/2015 13:55      No results found for: HGBA1C Lab Results  Component Value Date   CREATININE 1.11 12/12/2015       Scheduled Meds: . aspirin EC  81 mg Oral Daily  . dorzolamide  1 drop Both Eyes BID  . ipratropium-albuterol  3 mL Nebulization TID  . latanoprost  1 drop Both Eyes QHS  . levofloxacin (LEVAQUIN) IV  500 mg Intravenous Q24H  . methylPREDNISolone (SOLU-MEDROL) injection  60 mg Intravenous Q12H  . metoprolol  25 mg Oral Daily  . pantoprazole  40 mg Oral Daily  . polyethylene glycol  17 g Oral Daily  . sodium chloride flush  3 mL Intravenous Q12H  . warfarin  2 mg Oral ONCE-1800  . Warfarin - Pharmacist Dosing Inpatient   Does not apply q1800    Continuous Infusions:   Active Problems:   S/P AVR   COPD exacerbation (HCC)   Acute respiratory failure with hypoxia (HCC)   Weakness   Left lower quadrant pain   Weakness generalized   Weight loss    Time spent: 45 minutes   [facility] Pager [phone]. If 7PM-7AM, please contact night-coverage at [email or web address], password [facility] 12/12/2015, 11:12 AM  LOS: 1 day

## 2015-12-12 NOTE — Progress Notes (Signed)
  Echocardiogram 2D Echocardiogram has been performed.  Diamond Nickel 12/12/2015, 12:25 PM

## 2015-12-12 NOTE — Evaluation (Signed)
Occupational Therapy Evaluation Patient Details Name: [name] MRN: [id number] DOB: 09/01/1930 Today's Date: 12/12/2015    History of Present Illness Pt admitted with acute respiratory failure with hypoxia due to COPD exacerbation, new onset afib. PMH: CAD, COPD, AVR, CABG.   Clinical Impression   Pt was performing self care independently, used a cane only outside.  Pt presents with generalized weakness, impaired balance and poor activity tolerance/dyspnea interfering with ability to perform ADL and mobility. Will follow acutely.  Anticipate pt will be able to go home with his supportive wife who is in good health.    Follow Up Recommendations  Home health OT    Equipment Recommendations       Recommendations for Other Services       Precautions / Restrictions Precautions Precautions: Fall      Mobility Bed Mobility Overal bed mobility: Modified Independent                Transfers Overall transfer level: Needs assistance Equipment used: Rolling walker (2 wheeled) Transfers: Sit to/from Stand Sit to Stand: Min guard              Balance Overall balance assessment: Needs assistance   Sitting balance-Leahy Scale: Good       Standing balance-Leahy Scale: Fair                              ADL Overall ADL's : Needs assistance/impaired Eating/Feeding: Independent;Sitting   Grooming: Wash/dry hands;Wash/dry face;Sitting;Set up   Upper Body Bathing: Set up;Sitting   Lower Body Bathing: Sit to/from stand;Min guard   Upper Body Dressing : Set up;Sitting   Lower Body Dressing: Min guard   Toilet Transfer: Minimal assistance;Requires wide/bariatric;Ambulation   Toileting- Clothing Manipulation and Hygiene: Minimal assistance;Sit to/from stand       Functional mobility during ADLs: Minimal assistance;Rolling walker General ADL Comments: instructed pt in pursed lip breathing and began education in energy conservation, suggested use  of shower seat initially     Vision     Perception     Praxis      Pertinent Vitals/Pain Pain Assessment: No/denies pain     Hand Dominance Right   Extremity/Trunk Assessment Upper Extremity Assessment Upper Extremity Assessment: Overall WFL for tasks assessed   Lower Extremity Assessment Lower Extremity Assessment: Defer to PT evaluation       Communication Communication Communication: HOH (with aids)   Cognition Arousal/Alertness: Awake/alert Behavior During Therapy: Flat affect Overall Cognitive Status: History of cognitive impairments - at baseline       Memory: Decreased short-term memory             General Comments       Exercises       Shoulder Instructions      Home Living Family/patient expects to be discharged to:: Private residence   Available Help at Discharge: Family;Available 24 hours/day Type of Home: House Home Access: Stairs to enter [employer] of Steps: 2 Entrance Stairs-Rails: Right Home Layout: One level     Bathroom Shower/Tub: Occupational psychologist: Standard     Home Equipment: Marine scientist - single point          Prior Functioning/Environment Level of Independence: Independent with assistive device(s)        Comments: pt used cane when walking outside, drives only on their property    OT Diagnosis: Generalized weakness;Cognitive deficits   OT Problem List:  Decreased activity tolerance;Impaired balance (sitting and/or standing);Decreased strength   OT Treatment/Interventions: Self-care/ADL training;DME and/or AE instruction;Patient/family education;Balance training;Energy conservation    OT Goals(Current goals can be found in the care plan section) Acute Rehab OT Goals Patient Stated Goal: get my breath OT Goal Formulation: With patient Time For Goal Achievement: 12/26/15 Potential to Achieve Goals: Good ADL Goals Pt Will Perform Grooming: with supervision;standing (3  activities) Pt Will Perform Lower Body Bathing: with supervision;sit to/from stand Pt Will Perform Lower Body Dressing: with supervision;sit to/from stand Pt Will Transfer to Toilet: with supervision;ambulating;regular height toilet Pt Will Perform Toileting - Clothing Manipulation and hygiene: with supervision;sit to/from stand Pt Will Perform Tub/Shower Transfer: Shower transfer;with supervision;shower seat;rolling walker Additional ADL Goal #1: Pt will utilize energy conservation and breathing strategies during ADL and mobility with min verbal cues.  OT Frequency: Min 2X/week   Barriers to D/C:            Co-evaluation              End of Session Equipment Utilized During Treatment: Oxygen;Gait belt;Rolling walker  Activity Tolerance: Patient limited by fatigue Patient left: in bed;with call bell/phone within reach;with bed alarm set;with family/visitor present   Time: 3643-8377 OT Time Calculation (min): 14 min Charges:  OT General Charges $OT Visit: 1 Procedure OT Evaluation $OT Eval Moderate Complexity: 1 Procedure G-Codes:    Malka So 12/12/2015, 10:40 AM [phone]

## 2015-12-13 DIAGNOSIS — E43 Unspecified severe protein-calorie malnutrition: Secondary | ICD-10-CM | POA: Insufficient documentation

## 2015-12-13 LAB — CBC
HCT: 33.6 % — ABNORMAL LOW (ref 39.0–52.0)
HEMOGLOBIN: 10.9 g/dL — AB (ref 13.0–17.0)
MCH: 28.5 pg (ref 26.0–34.0)
MCHC: 32.4 g/dL (ref 30.0–36.0)
MCV: 88 fL (ref 78.0–100.0)
PLATELETS: 204 10*3/uL (ref 150–400)
RBC: 3.82 MIL/uL — AB (ref 4.22–5.81)
RDW: 13.5 % (ref 11.5–15.5)
WBC: 10.9 10*3/uL — AB (ref 4.0–10.5)

## 2015-12-13 LAB — GLUCOSE, CAPILLARY
GLUCOSE-CAPILLARY: 123 mg/dL — AB (ref 65–99)
GLUCOSE-CAPILLARY: 131 mg/dL — AB (ref 65–99)
GLUCOSE-CAPILLARY: 166 mg/dL — AB (ref 65–99)

## 2015-12-13 LAB — COMPREHENSIVE METABOLIC PANEL
ALBUMIN: 2.8 g/dL — AB (ref 3.5–5.0)
ALT: 17 U/L (ref 17–63)
AST: 28 U/L (ref 15–41)
Alkaline Phosphatase: 115 U/L (ref 38–126)
Anion gap: 7 (ref 5–15)
BUN: 23 mg/dL — ABNORMAL HIGH (ref 6–20)
CALCIUM: 9.1 mg/dL (ref 8.9–10.3)
CO2: 27 mmol/L (ref 22–32)
Chloride: 102 mmol/L (ref 101–111)
Creatinine, Ser: 1.11 mg/dL (ref 0.61–1.24)
GFR calc non Af Amer: 59 mL/min — ABNORMAL LOW (ref >60)
Glucose, Bld: 129 mg/dL — ABNORMAL HIGH (ref 65–99)
POTASSIUM: 4 mmol/L (ref 3.5–5.1)
SODIUM: 136 mmol/L (ref 135–145)
TOTAL PROTEIN: 6.8 g/dL (ref 6.5–8.1)
Total Bilirubin: 0.7 mg/dL (ref 0.3–1.2)

## 2015-12-13 LAB — PROTIME-INR
INR: 3.93 — AB (ref 0.00–1.49)
PROTHROMBIN TIME: 37.5 s — AB (ref 11.6–15.2)

## 2015-12-13 MED ORDER — WARFARIN 0.5 MG HALF TABLET
0.5000 mg | ORAL_TABLET | Freq: Once | ORAL | Status: DC
  Filled 2015-12-13: qty 1

## 2015-12-13 MED ORDER — METOPROLOL TARTRATE 25 MG PO TABS
25.0000 mg | ORAL_TABLET | Freq: Two times a day (BID) | ORAL | Status: DC
  Administered 2015-12-13 – 2015-12-17 (×8): 25 mg via ORAL
  Filled 2015-12-13 (×8): qty 1

## 2015-12-13 MED ORDER — IPRATROPIUM-ALBUTEROL 0.5-2.5 (3) MG/3ML IN SOLN
RESPIRATORY_TRACT | Status: AC
  Filled 2015-12-13: qty 3

## 2015-12-13 MED ORDER — DORZOLAMIDE HCL 2 % OP SOLN
1.0000 [drp] | Freq: Two times a day (BID) | OPHTHALMIC | Status: DC
  Administered 2015-12-13 – 2015-12-17 (×7): 1 [drp] via OPHTHALMIC
  Filled 2015-12-13: qty 10

## 2015-12-13 NOTE — Progress Notes (Addendum)
[facility] PROGRESS NOTE  JAYLYNN SIEFERT UXN:[id number] DOB: [birth year] DOA: 12/11/2015 PCP: [name], MD  Length of stay: 2   Assessment/Plan: Active Problems:   S/P AVR   COPD exacerbation ([place])   Acute respiratory failure with hypoxia (HCC)   Weakness   Left lower quadrant pain   Weakness generalized   Weight loss   Lung mass   PAF (paroxysmal atrial fibrillation) (HCC)   Protein-calorie malnutrition, severe    Brief summary 82 y.o. male with a hx of CAD, aortic stenosis, s/p mechanical [employer] AVR + 1v CABG (S-RCA) in 2001, ILD, coumadin rx. Myoview (12/06): Normal, EF 70%. Echo (02/2012): EF 60-65%, mechanical AVR ok with mean gradient 11 mmHg. Last seen by me in 03/2013. He continues to note chronic DOE. This is unchanged. Previously with NYHA Class 2b symptoms. No orthopnea, PND, edema. Presents to the ER with shortness of breath and weakness, patient noted getting weaker and having more shortness of breath on Tuesday, also has a nonproductive cough. Chest x-ray shows pulmonary fibrosis and chronic interstitial disease. EKG shows atrial fibrillation    Assessment and plan Acute respiratory failure with hypoxia likely secondary to COPD exacerbation. CXR without any acute findings or suggestion of heart failure.  Continue telemetry -Continue 02 via  Strong to maintain sats 88-92%. Currently requiring 4 liters. Patient was not on oxygen at home. ABG shows pH of 7.37, PCO2 36.7, PO2 of 76.0 -Scheduled Duonebs Q4 hours with Albuterol q2 prn continue  Levaquin for COPD exacerbation Continue Solumedrol '60mg'$  IV BID and reassess need for ongoing steroids tomorrow.  -H2 blocker or continue home PPI CT of the chest to rule out interstitial lung disease   Atrial fibrillation, rate controlled.Chadvasc 4.  This seems to be new onset A-fib or paroxysmal at best  Cardiology consulted , 12/2015 with LVEF 60-65%. Echo 03/2014 LVEF 55-60%, normal functioning  AVR -on coumadin for mechanical valve, goal 2.5-3.5  transient episode this admission. The patient is on lopressor '25mg'$  daily, we will change to bid dosing. He is already anticoagulated for his mechanical AVR, continue.   Lung mass - large 4.3 cm right sided mass suspicious for malignancy. Workup per primary team, if need to hold coumadin for invasive testing would bridge with heparin gtt.  Pulmonary consult will be requested once INR is below 2.0 for possible bronchoscopy  Aortic valve replacement - mechanical [employer]. On chronic coumadin. INR supratherapeutic . Pharmacy consulted for coumadin management   Weakness / Protein calorie malnutrition with weight loss related to chronic illness, malnutrition -PT evaluation -Dietician consult -continue Ensure for now   Left lower quadrant pain. Really more of a discomfort over the last 2 weeks. Son gives history of recent constipation which sounds unresolved despite a laxative at home. Will start patient on daily MiraLAX as tolerated. Need to exclude constipation as cause of left lower quadrant discomfort. UA negative   DVT prophylaxsis Coumadin  Code Status:      Code Status Orders     Partial code    Start     Ordered     Family Communication: Discussed in detail with the patient, all imaging results, lab results explained to the patient  Discussed with daughter Helene Kelp [phone], [place], [place] [phone] Disposition Plan: pulmonary consult on Monday when INR subtherapeutic      Consultants:  Cardiology  Procedures:  None  Antibiotics: Anti-infectives    Start  Dose/Rate Route Frequency Ordered Stop   12/12/15 0830  levofloxacin (LEVAQUIN) IVPB 500 mg     500 mg 100 mL/hr over 60 Minutes Intravenous Every 24 hours 12/12/15 0734     12/11/15 2000  doxycycline (VIBRAMYCIN) 100 mg in dextrose 5 % 250 mL IVPB  Status:  Discontinued     100 mg 125 mL/hr over 120 Minutes  Intravenous Every 12 hours 12/11/15 1741 12/12/15 0734         HPI/Subjective: Hypertensive this morning, currently on 2 L of nasal cannula  Objective: Filed Vitals:   12/12/15 2124 12/12/15 2304 12/13/15 0112 12/13/15 0718  BP:  176/96 150/90 162/74  Pulse:  95 90 77  Temp:  97.7 F (36.5 C)  97.4 F (36.3 C)  TempSrc:    Oral  Resp:    17  Weight:    61.961 kg (136 lb 9.6 oz)  SpO2: 98% 98%      Intake/Output Summary (Last 24 hours) at 12/13/15 1217 Last data filed at 12/13/15 0945  Gross per 24 hour  Intake    100 ml  Output    750 ml  Net   -650 ml    Exam:  General: No acute respiratory distress Lungs: Clear to auscultation bilaterally without wheezes or crackles Cardiovascular: Irregularly irregular, without murmur gallop or rub normal S1 and S2 Abdomen: Nontender, nondistended, soft, bowel sounds positive, no rebound, no ascites, no appreciable mass Extremities: No significant cyanosis, clubbing, or edema bilateral lower extremities     Data Review   Micro Results No results found for this or any previous visit (from the past 240 hour(s)).  Radiology Reports Dg Chest 2 View  12/11/2015  CLINICAL DATA:  Shortness of breath, weakness and cough for a while, some blood in stool, smoker, COPD, emphysema, coronary disease post bypass, hypertension EXAM: CHEST  2 VIEW COMPARISON:  12/09/2015 FINDINGS: Normal size of cardiac silhouette post AVR. Atherosclerotic calcification aorta. Question enlarged central pulmonary arteries which could reflect pulmonary arterial hypertension. Emphysematous and bronchitic changes consistent with COPD. Chronic interstitial lung disease changes in the mid to lower lungs bilaterally similar to previous exam likely representing fibrosis. No definite superimposed acute infiltrate, pleural effusion or pneumothorax. Bones diffusely demineralized. IMPRESSION: Post AVR. COPD changes with chronic interstitial disease/pulmonary fibrosis. No  acute abnormalities. Electronically Signed   By: Lavonia Dana M.D.   On: 12/11/2015 13:55   Dg Chest 2 View  12/09/2015  CLINICAL DATA:  Weight loss, severe emphysema and pulmonary fibrosis. Status post aortic valve replacement. EXAM: CHEST  2 VIEW COMPARISON:  60 in 2015, 03/21/2014 FINDINGS: Chronic background severe emphysema pattern and mid and lower lung interstitial fibrosis. Interval resolution of the previous right upper lobe spiculated airspace opacity, compatible with infectious/inflammatory process. No current superimposed edema or pneumonia. No effusion or pneumothorax. Trachea midline. Prior coronary bypass also evident as well as aortic valve replacement. Atherosclerosis of the aorta. Chronic appearing mid thoracic mild compression fracture. IMPRESSION: Chronic background severe emphysema and mid and lower lung interstitial fibrosis. Interval resolution of the right upper lobe focal spiculated airspace process. Suspect infectious/inflammatory process. No current acute process. Electronically Signed   By: Jerilynn Mages.  Shick M.D.   On: 12/09/2015 15:13   Ct Chest Wo Contrast  12/12/2015  CLINICAL DATA:  Shortness breath, nonproductive cough. History of pulmonary fibrosis. EXAM: CT CHEST WITHOUT CONTRAST TECHNIQUE: Multidetector CT imaging of the chest was performed following the standard protocol without IV contrast. COMPARISON:  Chest radiographs dated 12/11/2015. CT  chest dated 03/21/2014. FINDINGS: Mediastinum/Nodes: The heart is normal in size. No pericardial effusion. Fluid in the superior pericardial recess. Coronary atherosclerosis. Postsurgical changes related to prior CABG. Prosthetic aortic valve. Atherosclerotic calcifications of the aortic arch. 4.6 cm ascending thoracic aortic aneurysm (series 2/image 38). 12 mm short axis right paratracheal node (series 2/ image 26), previously 10 mm. Lungs/Pleura: 4.0 x 2.9 x 4.3 cm infrahilar/ central right lower lobe mass narrowing the right middle lobe and  right lower lobe bronchus, suspicious for primary bronchogenic neoplasm. 16 x 9 mm subpleural nodule in the medial right lower lobe (series 3/ image 38), new. 9 mm nodule in the superior segment left lower lobe (series 3/image 36), new. Additional mild patchy/nodular opacity in the superior segment right lower lobe (series 3/image 31), favored to reflect postobstructive opacity. Lower lobe predominant bronchiectasis with bronchial wall thickening. Associated subpleural reticulation/ fibrosis in the bilateral lower lobes. Trace right pleural effusion. Underlying moderate centrilobular and paraseptal emphysematous changes. No pneumothorax. Upper abdomen: Hyperdense left adrenal nodule (series 2/image 60), incompletely visualized, chronic. Right upper pole renal cyst (series 2/image 60), incompletely visualized, chronic. Musculoskeletal: Degenerative changes of the visualized thoracolumbar spine. Mild wedging of three mid thoracic vertebral bodies (series 5/image 59), T6-T8, mildly progressed since 2015. IMPRESSION: 4.3 cm right infrahilar/central right lower lobe mass, suspicious for primary bronchogenic neoplasm. Associated bilateral lower lobe pulmonary nodules, suspicious for metastases. Consider bronchoscopy for tissue confirmation. PET-CT may be useful for staging. Electronically Signed   By: Julian Hy M.D.   On: 12/12/2015 16:01     CBC  Recent Labs Lab 12/11/15 1333 12/12/15 0637 12/13/15 0614  WBC 7.3 8.8 10.9*  HGB 11.0* 10.8* 10.9*  HCT 34.4* 32.7* 33.6*  PLT 179 172 204  MCV 88.4 88.4 88.0  MCH 28.3 29.2 28.5  MCHC 32.0 33.0 32.4  RDW 13.3 13.5 13.5    Chemistries   Recent Labs Lab 12/11/15 1333 12/12/15 0637 12/13/15 0614  NA 139 137 136  K 4.1 4.1 4.0  CL 105 104 102  CO2 '25 24 27  '$ GLUCOSE 135* 144* 129*  BUN 14 15 23*  CREATININE 1.15 1.11 1.11  CALCIUM 8.9 9.0 9.1  AST 17  --  28  ALT 12*  --  17  ALKPHOS 126  --  115  BILITOT 0.8  --  0.7    ------------------------------------------------------------------------------------------------------------------ estimated creatinine clearance is 44.2 mL/min (by C-G formula based on Cr of 1.11). ------------------------------------------------------------------------------------------------------------------ No results for input(s): HGBA1C in the last 72 hours. ------------------------------------------------------------------------------------------------------------------ No results for input(s): CHOL, HDL, LDLCALC, TRIG, CHOLHDL, LDLDIRECT in the last 72 hours. ------------------------------------------------------------------------------------------------------------------ No results for input(s): TSH, T4TOTAL, T3FREE, THYROIDAB in the last 72 hours.  Invalid input(s): FREET3 ------------------------------------------------------------------------------------------------------------------ No results for input(s): VITAMINB12, FOLATE, FERRITIN, TIBC, IRON, RETICCTPCT in the last 72 hours.  Coagulation profile  Recent Labs Lab 12/11/15 1333 12/12/15 0637 12/13/15 0614  INR 4.76* 3.98* 3.93*    No results for input(s): DDIMER in the last 72 hours.  Cardiac Enzymes No results for input(s): CKMB, TROPONINI, MYOGLOBIN in the last 168 hours.  Invalid input(s): CK ------------------------------------------------------------------------------------------------------------------ Invalid input(s): POCBNP   CBG:  Recent Labs Lab 12/13/15 0814  GLUCAP 123*       Studies: Dg Chest 2 View  12/11/2015  CLINICAL DATA:  Shortness of breath, weakness and cough for a while, some blood in stool, smoker, COPD, emphysema, coronary disease post bypass, hypertension EXAM: CHEST  2 VIEW COMPARISON:  12/09/2015 FINDINGS: Normal size of cardiac silhouette post  AVR. Atherosclerotic calcification aorta. Question enlarged central pulmonary arteries which could reflect pulmonary arterial  hypertension. Emphysematous and bronchitic changes consistent with COPD. Chronic interstitial lung disease changes in the mid to lower lungs bilaterally similar to previous exam likely representing fibrosis. No definite superimposed acute infiltrate, pleural effusion or pneumothorax. Bones diffusely demineralized. IMPRESSION: Post AVR. COPD changes with chronic interstitial disease/pulmonary fibrosis. No acute abnormalities. Electronically Signed   By: Lavonia Dana M.D.   On: 12/11/2015 13:55   Ct Chest Wo Contrast  12/12/2015  CLINICAL DATA:  Shortness breath, nonproductive cough. History of pulmonary fibrosis. EXAM: CT CHEST WITHOUT CONTRAST TECHNIQUE: Multidetector CT imaging of the chest was performed following the standard protocol without IV contrast. COMPARISON:  Chest radiographs dated 12/11/2015. CT chest dated 03/21/2014. FINDINGS: Mediastinum/Nodes: The heart is normal in size. No pericardial effusion. Fluid in the superior pericardial recess. Coronary atherosclerosis. Postsurgical changes related to prior CABG. Prosthetic aortic valve. Atherosclerotic calcifications of the aortic arch. 4.6 cm ascending thoracic aortic aneurysm (series 2/image 38). 12 mm short axis right paratracheal node (series 2/ image 26), previously 10 mm. Lungs/Pleura: 4.0 x 2.9 x 4.3 cm infrahilar/ central right lower lobe mass narrowing the right middle lobe and right lower lobe bronchus, suspicious for primary bronchogenic neoplasm. 16 x 9 mm subpleural nodule in the medial right lower lobe (series 3/ image 38), new. 9 mm nodule in the superior segment left lower lobe (series 3/image 36), new. Additional mild patchy/nodular opacity in the superior segment right lower lobe (series 3/image 31), favored to reflect postobstructive opacity. Lower lobe predominant bronchiectasis with bronchial wall thickening. Associated subpleural reticulation/ fibrosis in the bilateral lower lobes. Trace right pleural effusion. Underlying moderate  centrilobular and paraseptal emphysematous changes. No pneumothorax. Upper abdomen: Hyperdense left adrenal nodule (series 2/image 60), incompletely visualized, chronic. Right upper pole renal cyst (series 2/image 60), incompletely visualized, chronic. Musculoskeletal: Degenerative changes of the visualized thoracolumbar spine. Mild wedging of three mid thoracic vertebral bodies (series 5/image 59), T6-T8, mildly progressed since 2015. IMPRESSION: 4.3 cm right infrahilar/central right lower lobe mass, suspicious for primary bronchogenic neoplasm. Associated bilateral lower lobe pulmonary nodules, suspicious for metastases. Consider bronchoscopy for tissue confirmation. PET-CT may be useful for staging. Electronically Signed   By: Julian Hy M.D.   On: 12/12/2015 16:01      No results found for: HGBA1C Lab Results  Component Value Date   CREATININE 1.11 12/13/2015       Scheduled Meds: . aspirin EC  81 mg Oral Daily  . dorzolamide  1 drop Both Eyes BID  . feeding supplement (ENSURE ENLIVE)  237 mL Oral BID BM  . ipratropium-albuterol  3 mL Nebulization TID  . ipratropium-albuterol      . latanoprost  1 drop Both Eyes QHS  . levofloxacin (LEVAQUIN) IV  500 mg Intravenous Q24H  . methylPREDNISolone (SOLU-MEDROL) injection  60 mg Intravenous Q12H  . metoprolol  25 mg Oral BID  . pantoprazole  40 mg Oral Daily  . polyethylene glycol  17 g Oral Daily  . sodium chloride flush  3 mL Intravenous Q12H  . warfarin  0.5 mg Oral ONCE-1800  . Warfarin - Pharmacist Dosing Inpatient   Does not apply q1800   Continuous Infusions:   Active Problems:   S/P AVR   COPD exacerbation (HCC)   Acute respiratory failure with hypoxia (HCC)   Weakness   Left lower quadrant pain   Weakness generalized   Weight loss   Lung mass   PAF (  paroxysmal atrial fibrillation) (HCC)   Protein-calorie malnutrition, severe    Time spent: 45 minutes   [facility] Pager [phone]. If  7PM-7AM, please contact night-coverage at [email or web address], password [facility] 12/13/2015, 12:17 PM  LOS: 2 days

## 2015-12-13 NOTE — Progress Notes (Addendum)
[place] for Coumadin to Heparin Indication: Mechanical valve  Allergies  Allergen Reactions  . Penicillins Anaphylaxis and Rash    Vital Signs: Temp: 97.4 F (36.3 C) (03/04 0718) Temp Source: Oral (03/04 0718) BP: 162/74 mmHg (03/04 0718) Pulse Rate: 77 (03/04 0718)  Labs:  Recent Labs  12/11/15 1333 12/12/15 0637 12/13/15 0614  HGB 11.0* 10.8* 10.9*  HCT 34.4* 32.7* 33.6*  PLT 179 172 204  LABPROT 43.3* 37.9* 37.5*  INR 4.76* 3.98* 3.93*  CREATININE 1.15 1.11 1.11    Estimated Creatinine Clearance: 44.2 mL/min (by C-G formula based on Cr of 1.11).   Medical History: Past Medical History  Diagnosis Date  . CORONARY ATHEROSCLEROSIS NATIVE CORONARY ARTERY 04/23/2009  . DRY EYE SYNDROME 10/09/2008  . COPD 10/09/2008  . CAD, ARTERY BYPASS GRAFT 10/09/2008  . Aortic valve disorder 12/07/2010  . Palpitations   . Emphysema   . HOH (hard of hearing)      Assessment: 82 year old male on Coumadin PTA for valve, needs workup for lung mass Coumadin now on hold - pending heparin when INR appropriate  Goal of Therapy:  Heparin level = 0.3 to 0.7 Monitor platelets by anticoagulation protocol: Yes   Plan:  Heparin when INR < 2.5  Thank you Anette Guarneri, PharmD [phone] 12/13/2015 11:30 AM

## 2015-12-13 NOTE — Progress Notes (Signed)
Patient ID: [name], male   DOB: 08/25/1930, 82 y.o.   MRN: [id number]     Subjective:    SOB improving.   Objective:   Temp:  [97.4 F (36.3 C)-97.8 F (36.6 C)] 97.4 F (36.3 C) (03/04 0718) Pulse Rate:  [77-95] 77 (03/04 0718) Resp:  [17-22] 17 (03/04 0718) BP: (150-176)/(74-114) 162/74 mmHg (03/04 0718) SpO2:  [98 %-100 %] 98 % (03/03 2304) Weight:  [136 lb 9.6 oz (61.961 kg)] 136 lb 9.6 oz (61.961 kg) (03/04 0718) Last BM Date: 12/12/15  Filed Weights   12/13/15 0718  Weight: 136 lb 9.6 oz (61.961 kg)    Intake/Output Summary (Last 24 hours) at 12/13/15 0859 Last data filed at 12/13/15 0200  Gross per 24 hour  Intake    250 ml  Output    400 ml  Net   -150 ml    Telemetry: NSR  Exam:  General: NAD  HEENT: sclera clear, throat clear  Resp: bilateral wheezing  Cardiac: RRR, mechanical S2, no jvd, no m/r/g  GI: abdomen soft, NT, ND  MSK: no LE edema  Neuro: no focal deficits  Psych: appropriate affect  Lab Results:  Basic Metabolic Panel:  Recent Labs Lab 12/11/15 1333 12/12/15 0637  NA 139 137  K 4.1 4.1  CL 105 104  CO2 25 24  GLUCOSE 135* 144*  BUN 14 15  CREATININE 1.15 1.11  CALCIUM 8.9 9.0    Liver Function Tests:  Recent Labs Lab 12/11/15 1333  AST 17  ALT 12*  ALKPHOS 126  BILITOT 0.8  PROT 6.9  ALBUMIN 2.8*    CBC:  Recent Labs Lab 12/11/15 1333 12/12/15 0637 12/13/15 0614  WBC 7.3 8.8 10.9*  HGB 11.0* 10.8* 10.9*  HCT 34.4* 32.7* 33.6*  MCV 88.4 88.4 88.0  PLT 179 172 204    Cardiac Enzymes: No results for input(s): CKTOTAL, CKMB, CKMBINDEX, TROPONINI in the last 168 hours.  BNP: No results for input(s): PROBNP in the last 8760 hours.  Coagulation:  Recent Labs Lab 12/11/15 1333 12/12/15 0637 12/13/15 0614  INR 4.76* 3.98* 3.93*    ECG:   Medications:   Scheduled Medications: . aspirin EC  81 mg Oral Daily  . dorzolamide  1 drop Both Eyes BID  . feeding supplement (ENSURE  ENLIVE)  237 mL Oral BID BM  . ipratropium-albuterol  3 mL Nebulization TID  . latanoprost  1 drop Both Eyes QHS  . levofloxacin (LEVAQUIN) IV  500 mg Intravenous Q24H  . methylPREDNISolone (SOLU-MEDROL) injection  60 mg Intravenous Q12H  . metoprolol  25 mg Oral Daily  . pantoprazole  40 mg Oral Daily  . polyethylene glycol  17 g Oral Daily  . sodium chloride flush  3 mL Intravenous Q12H  . Warfarin - Pharmacist Dosing Inpatient   Does not apply q1800     Infusions:     PRN Medications:  acetaminophen **OR** acetaminophen, HYDROcodone-acetaminophen, levalbuterol, ondansetron **OR** ondansetron (ZOFRAN) IV     Assessment/Plan    1. Aortic stenosis - history of mechanical AVR - limited echo 12/2015 with LVEF 60-65%. Echo 03/2014 LVEF 55-60%, normal functioning AVR.  - will review echo images from this admit, appears likely however his symptoms are primary pulmonary etiology as opposed to cardiac.   2. CAD - history of prior CABG - trop neg x, 1, EKG with LVH and chronic strain pattern  3. Lung mass - large 4.3 cm right sided mass suspicious for malignancy. Workup per  primary team, if need to hold coumadin for invasive testing would bridge with heparin gtt.   4. Afib - transient episode this admission. The patient is on lopressor '25mg'$  daily, we will change to bid dosing. He is already anticoagulated for his mechanical AVR, continue.   5. COPD exacerbation - management per primary team.   Carlyle Dolly, M.D.

## 2015-12-14 ENCOUNTER — Inpatient Hospital Stay (HOSPITAL_COMMUNITY): Payer: Medicare Other

## 2015-12-14 DIAGNOSIS — I10 Essential (primary) hypertension: Secondary | ICD-10-CM

## 2015-12-14 LAB — PROTIME-INR
INR: 3.28 — AB (ref 0.00–1.49)
Prothrombin Time: 32.7 seconds — ABNORMAL HIGH (ref 11.6–15.2)

## 2015-12-14 MED ORDER — AMLODIPINE BESYLATE 5 MG PO TABS
5.0000 mg | ORAL_TABLET | Freq: Every day | ORAL | Status: DC
  Administered 2015-12-14 – 2015-12-17 (×4): 5 mg via ORAL
  Filled 2015-12-14 (×4): qty 1

## 2015-12-14 MED ORDER — GADOBENATE DIMEGLUMINE 529 MG/ML IV SOLN
13.0000 mL | Freq: Once | INTRAVENOUS | Status: AC | PRN
  Administered 2015-12-14: 13 mL via INTRAVENOUS

## 2015-12-14 MED ORDER — LEVOFLOXACIN 500 MG PO TABS
500.0000 mg | ORAL_TABLET | Freq: Every day | ORAL | Status: DC
  Administered 2015-12-15 – 2015-12-17 (×3): 500 mg via ORAL
  Filled 2015-12-14 (×3): qty 1

## 2015-12-14 MED ORDER — LORAZEPAM 2 MG/ML IJ SOLN
1.0000 mg | Freq: Once | INTRAMUSCULAR | Status: AC
  Administered 2015-12-14: 1 mg via INTRAVENOUS

## 2015-12-14 MED ORDER — LORAZEPAM 2 MG/ML IJ SOLN
INTRAMUSCULAR | Status: AC
  Administered 2015-12-14: 2 mg
  Filled 2015-12-14: qty 1

## 2015-12-14 NOTE — Progress Notes (Signed)
[facility] for Coumadin to Heparin Indication: Mechanical valve  Allergies  Allergen Reactions  . Penicillins Anaphylaxis and Rash    Vital Signs: Temp: 97.5 F (36.4 C) (03/05 0552) Temp Source: Oral (03/05 0552) BP: 116/99 mmHg (03/05 0601) Pulse Rate: 72 (03/05 0601)  Labs:  Recent Labs  12/11/15 1333 12/12/15 0637 12/13/15 0614 12/14/15 0632  HGB 11.0* 10.8* 10.9*  --   HCT 34.4* 32.7* 33.6*  --   PLT 179 172 204  --   LABPROT 43.3* 37.9* 37.5* 32.7*  INR 4.76* 3.98* 3.93* 3.28*  CREATININE 1.15 1.11 1.11  --     Estimated Creatinine Clearance: 44.6 mL/min (by C-G formula based on Cr of 1.11).   Medical History: Past Medical History  Diagnosis Date  . CORONARY ATHEROSCLEROSIS NATIVE CORONARY ARTERY 04/23/2009  . DRY EYE SYNDROME 10/09/2008  . COPD 10/09/2008  . CAD, ARTERY BYPASS GRAFT 10/09/2008  . Aortic valve disorder 12/07/2010  . Palpitations   . Emphysema   . HOH (hard of hearing)      Assessment: 82 year old male on Coumadin PTA for valve, needs workup for lung mass Coumadin now on hold - pending heparin when INR appropriate  INR today = 3.28  Goal of Therapy:  Heparin level = 0.3 to 0.7 Monitor platelets by anticoagulation protocol: Yes   Plan:  Heparin when INR < 2.5  Thank you Anette Guarneri, PharmD [phone] 12/14/2015 1:23 PM

## 2015-12-14 NOTE — Progress Notes (Signed)
[facility] PROGRESS NOTE  [name] DUK:[id number] DOB: [birth year] DOA: 12/11/2015 PCP: [name], MD  Length of stay: 3   Assessment/Plan: Active Problems:   S/P AVR   COPD exacerbation ([place])   Acute respiratory failure with hypoxia (HCC)   Weakness   Left lower quadrant pain   Weakness generalized   Weight loss   Lung mass   PAF (paroxysmal atrial fibrillation) (HCC)   Protein-calorie malnutrition, severe    Brief summary 82 y.o. male with a hx of CAD, aortic stenosis, s/p mechanical [employer] AVR + 1v CABG (S-RCA) in 2001, ILD, coumadin rx. Myoview (12/06): Normal, EF 70%. Echo (02/2012): EF 60-65%, mechanical AVR ok with mean gradient 11 mmHg. Last seen by me in 03/2013. He continues to note chronic DOE. This is unchanged. Previously with NYHA Class 2b symptoms. No orthopnea, PND, edema. Presents to the ER with shortness of breath and weakness, patient noted getting weaker and having more shortness of breath on Tuesday, also has a nonproductive cough. Chest x-ray shows pulmonary fibrosis and chronic interstitial disease. EKG shows atrial fibrillation. Patient also found to have a new lung mass this admission    Assessment and plan Acute respiratory failure with hypoxia likely secondary to COPD exacerbation. CXR without any acute findings or suggestion of heart failure.  Continue telemetry -Continue 02 via  [place] to maintain sats 88-92%. Currently requiring 4 liters. Patient was not on oxygen at home. ABG shows pH of 7.37, PCO2 36.7, PO2 of 76.0 -Scheduled Duonebs Q4 hours with Albuterol q2 prn continue  Levaquin for COPD exacerbation , switched to oral Continue Solumedrol '60mg'$  IV BID and reassess need for ongoing steroids tomorrow.  -H2 blocker or continue home PPI CT of the chest to rule out interstitial lung disease shows a new lung mass   Atrial fibrillation, rate controlled.Chadvasc 4.  This seems to be new onset A-fib or paroxysmal at  best  Cardiology consulted , 12/2015 with LVEF 60-65%. Echo 03/2014 LVEF 55-60%, normal functioning AVR -on coumadin for mechanical valve, goal 2.5-3.5  transient episode this admission. The patient is on lopressor '25mg'$  daily, we will change to bid dosing. He is already anticoagulated for his mechanical AVR, continue.   Lung mass - large 4.3 cm right sided mass suspicious for malignancy. Workup per primary team, if need to hold coumadin for invasive testing would bridge with heparin gtt.  Pulmonary consult will be requested once INR is below 2.0 for possible bronchoscopy , likely Tuesday  MRI of the brain to rule out metastatic disease, given the patient is complaining of a headache   Aortic valve replacement - mechanical [employer]. On chronic coumadin. INR supratherapeutic . Pharmacy consulted for coumadin management , bridge with heparin when INR is less than 2.5 , goal INR 2.5-3.5   Weakness / Protein calorie malnutrition with weight loss related to chronic illness, malnutrition -PT evaluation -Dietician consult -continue Ensure for now   Left lower quadrant pain. Really more of a discomfort over the last 2 weeks. Son gives history of recent constipation which sounds unresolved despite a laxative at home.  continue MiraLAX as tolerated.  Could consider CT abdomen pelvis for  Complete staging   DVT prophylaxsis Coumadin  Code Status:      Code Status Orders     Partial code    Start     Ordered     Family Communication: Discussed in detail with the patient, all imaging results, lab results  explained to the patient  Discussed with daughter Helene Kelp [phone], [place], [place] [phone] Disposition Plan: pulmonary consult on Monday when INR subtherapeutic      Consultants:  Cardiology  Procedures:  None  Antibiotics: Anti-infectives    Start     Dose/Rate Route Frequency Ordered Stop   12/14/15 1115  levofloxacin (LEVAQUIN) tablet  500 mg     500 mg Oral Daily 12/14/15 1102     12/12/15 0830  levofloxacin (LEVAQUIN) IVPB 500 mg  Status:  Discontinued     500 mg 100 mL/hr over 60 Minutes Intravenous Every 24 hours 12/12/15 0734 12/14/15 1102   12/11/15 2000  doxycycline (VIBRAMYCIN) 100 mg in dextrose 5 % 250 mL IVPB  Status:  Discontinued     100 mg 125 mL/hr over 120 Minutes Intravenous Every 12 hours 12/11/15 1741 12/12/15 0734         HPI/Subjective:  denies any shortness of breath or chest pain, resting comfortably  Objective: Filed Vitals:   12/13/15 2200 12/14/15 0552 12/14/15 0601 12/14/15 0915  BP: 158/74 195/91 116/99   Pulse: 100 84 72   Temp:  97.5 F (36.4 C)    TempSrc:  Oral    Resp: 22 24    Weight:  62.596 kg (138 lb)    SpO2: 97% 98%  98%    Intake/Output Summary (Last 24 hours) at 12/14/15 1103 Last data filed at 12/14/15 0529  Gross per 24 hour  Intake    160 ml  Output   1075 ml  Net   -915 ml    Exam:  General: No acute respiratory distress Lungs: Clear to auscultation bilaterally without wheezes or crackles Cardiovascular: Irregularly irregular, without murmur gallop or rub normal S1 and S2 Abdomen: Nontender, nondistended, soft, bowel sounds positive, no rebound, no ascites, no appreciable mass Extremities: No significant cyanosis, clubbing, or edema bilateral lower extremities     Data Review   Micro Results No results found for this or any previous visit (from the past 240 hour(s)).  Radiology Reports Dg Chest 2 View  12/11/2015  CLINICAL DATA:  Shortness of breath, weakness and cough for a while, some blood in stool, smoker, COPD, emphysema, coronary disease post bypass, hypertension EXAM: CHEST  2 VIEW COMPARISON:  12/09/2015 FINDINGS: Normal size of cardiac silhouette post AVR. Atherosclerotic calcification aorta. Question enlarged central pulmonary arteries which could reflect pulmonary arterial hypertension. Emphysematous and bronchitic changes consistent with  COPD. Chronic interstitial lung disease changes in the mid to lower lungs bilaterally similar to previous exam likely representing fibrosis. No definite superimposed acute infiltrate, pleural effusion or pneumothorax. Bones diffusely demineralized. IMPRESSION: Post AVR. COPD changes with chronic interstitial disease/pulmonary fibrosis. No acute abnormalities. Electronically Signed   By: Lavonia Dana M.D.   On: 12/11/2015 13:55   Dg Chest 2 View  12/09/2015  CLINICAL DATA:  Weight loss, severe emphysema and pulmonary fibrosis. Status post aortic valve replacement. EXAM: CHEST  2 VIEW COMPARISON:  60 in 2015, 03/21/2014 FINDINGS: Chronic background severe emphysema pattern and mid and lower lung interstitial fibrosis. Interval resolution of the previous right upper lobe spiculated airspace opacity, compatible with infectious/inflammatory process. No current superimposed edema or pneumonia. No effusion or pneumothorax. Trachea midline. Prior coronary bypass also evident as well as aortic valve replacement. Atherosclerosis of the aorta. Chronic appearing mid thoracic mild compression fracture. IMPRESSION: Chronic background severe emphysema and mid and lower lung interstitial fibrosis. Interval resolution of the right  upper lobe focal spiculated airspace process. Suspect infectious/inflammatory process. No current acute process. Electronically Signed   By: Jerilynn Mages.  Shick M.D.   On: 12/09/2015 15:13   Ct Chest Wo Contrast  12/12/2015  CLINICAL DATA:  Shortness breath, nonproductive cough. History of pulmonary fibrosis. EXAM: CT CHEST WITHOUT CONTRAST TECHNIQUE: Multidetector CT imaging of the chest was performed following the standard protocol without IV contrast. COMPARISON:  Chest radiographs dated 12/11/2015. CT chest dated 03/21/2014. FINDINGS: Mediastinum/Nodes: The heart is normal in size. No pericardial effusion. Fluid in the superior pericardial recess. Coronary atherosclerosis. Postsurgical changes related to  prior CABG. Prosthetic aortic valve. Atherosclerotic calcifications of the aortic arch. 4.6 cm ascending thoracic aortic aneurysm (series 2/image 38). 12 mm short axis right paratracheal node (series 2/ image 26), previously 10 mm. Lungs/Pleura: 4.0 x 2.9 x 4.3 cm infrahilar/ central right lower lobe mass narrowing the right middle lobe and right lower lobe bronchus, suspicious for primary bronchogenic neoplasm. 16 x 9 mm subpleural nodule in the medial right lower lobe (series 3/ image 38), new. 9 mm nodule in the superior segment left lower lobe (series 3/image 36), new. Additional mild patchy/nodular opacity in the superior segment right lower lobe (series 3/image 31), favored to reflect postobstructive opacity. Lower lobe predominant bronchiectasis with bronchial wall thickening. Associated subpleural reticulation/ fibrosis in the bilateral lower lobes. Trace right pleural effusion. Underlying moderate centrilobular and paraseptal emphysematous changes. No pneumothorax. Upper abdomen: Hyperdense left adrenal nodule (series 2/image 60), incompletely visualized, chronic. Right upper pole renal cyst (series 2/image 60), incompletely visualized, chronic. Musculoskeletal: Degenerative changes of the visualized thoracolumbar spine. Mild wedging of three mid thoracic vertebral bodies (series 5/image 59), T6-T8, mildly progressed since 2015. IMPRESSION: 4.3 cm right infrahilar/central right lower lobe mass, suspicious for primary bronchogenic neoplasm. Associated bilateral lower lobe pulmonary nodules, suspicious for metastases. Consider bronchoscopy for tissue confirmation. PET-CT may be useful for staging. Electronically Signed   By: Julian Hy M.D.   On: 12/12/2015 16:01     CBC  Recent Labs Lab 12/11/15 1333 12/12/15 0637 12/13/15 0614  WBC 7.3 8.8 10.9*  HGB 11.0* 10.8* 10.9*  HCT 34.4* 32.7* 33.6*  PLT 179 172 204  MCV 88.4 88.4 88.0  MCH 28.3 29.2 28.5  MCHC 32.0 33.0 32.4  RDW 13.3 13.5  13.5    Chemistries   Recent Labs Lab 12/11/15 1333 12/12/15 0637 12/13/15 0614  NA 139 137 136  K 4.1 4.1 4.0  CL 105 104 102  CO2 '25 24 27  '$ GLUCOSE 135* 144* 129*  BUN 14 15 23*  CREATININE 1.15 1.11 1.11  CALCIUM 8.9 9.0 9.1  AST 17  --  28  ALT 12*  --  17  ALKPHOS 126  --  115  BILITOT 0.8  --  0.7   ------------------------------------------------------------------------------------------------------------------ estimated creatinine clearance is 44.6 mL/min (by C-G formula based on Cr of 1.11). ------------------------------------------------------------------------------------------------------------------ No results for input(s): HGBA1C in the last 72 hours. ------------------------------------------------------------------------------------------------------------------ No results for input(s): CHOL, HDL, LDLCALC, TRIG, CHOLHDL, LDLDIRECT in the last 72 hours. ------------------------------------------------------------------------------------------------------------------ No results for input(s): TSH, T4TOTAL, T3FREE, THYROIDAB in the last 72 hours.  Invalid input(s): FREET3 ------------------------------------------------------------------------------------------------------------------ No results for input(s): VITAMINB12, FOLATE, FERRITIN, TIBC, IRON, RETICCTPCT in the last 72 hours.  Coagulation profile  Recent Labs Lab 12/11/15 1333 12/12/15 0637 12/13/15 0614 12/14/15 0632  INR 4.76* 3.98* 3.93* 3.28*    No results for input(s): DDIMER in the last 72 hours.  Cardiac Enzymes No results for input(s): CKMB, TROPONINI, MYOGLOBIN  in the last 168 hours.  Invalid input(s): CK ------------------------------------------------------------------------------------------------------------------ Invalid input(s): POCBNP   CBG:  Recent Labs Lab 12/13/15 0814 12/13/15 1229 12/13/15 1701  GLUCAP 123* 131* 166*       Studies: Ct Chest Wo  Contrast  12/12/2015  CLINICAL DATA:  Shortness breath, nonproductive cough. History of pulmonary fibrosis. EXAM: CT CHEST WITHOUT CONTRAST TECHNIQUE: Multidetector CT imaging of the chest was performed following the standard protocol without IV contrast. COMPARISON:  Chest radiographs dated 12/11/2015. CT chest dated 03/21/2014. FINDINGS: Mediastinum/Nodes: The heart is normal in size. No pericardial effusion. Fluid in the superior pericardial recess. Coronary atherosclerosis. Postsurgical changes related to prior CABG. Prosthetic aortic valve. Atherosclerotic calcifications of the aortic arch. 4.6 cm ascending thoracic aortic aneurysm (series 2/image 38). 12 mm short axis right paratracheal node (series 2/ image 26), previously 10 mm. Lungs/Pleura: 4.0 x 2.9 x 4.3 cm infrahilar/ central right lower lobe mass narrowing the right middle lobe and right lower lobe bronchus, suspicious for primary bronchogenic neoplasm. 16 x 9 mm subpleural nodule in the medial right lower lobe (series 3/ image 38), new. 9 mm nodule in the superior segment left lower lobe (series 3/image 36), new. Additional mild patchy/nodular opacity in the superior segment right lower lobe (series 3/image 31), favored to reflect postobstructive opacity. Lower lobe predominant bronchiectasis with bronchial wall thickening. Associated subpleural reticulation/ fibrosis in the bilateral lower lobes. Trace right pleural effusion. Underlying moderate centrilobular and paraseptal emphysematous changes. No pneumothorax. Upper abdomen: Hyperdense left adrenal nodule (series 2/image 60), incompletely visualized, chronic. Right upper pole renal cyst (series 2/image 60), incompletely visualized, chronic. Musculoskeletal: Degenerative changes of the visualized thoracolumbar spine. Mild wedging of three mid thoracic vertebral bodies (series 5/image 59), T6-T8, mildly progressed since 2015. IMPRESSION: 4.3 cm right infrahilar/central right lower lobe mass,  suspicious for primary bronchogenic neoplasm. Associated bilateral lower lobe pulmonary nodules, suspicious for metastases. Consider bronchoscopy for tissue confirmation. PET-CT may be useful for staging. Electronically Signed   By: Julian Hy M.D.   On: 12/12/2015 16:01      No results found for: HGBA1C Lab Results  Component Value Date   CREATININE 1.11 12/13/2015       Scheduled Meds: . amLODipine  5 mg Oral Daily  . aspirin EC  81 mg Oral Daily  . dorzolamide  1 drop Both Eyes BID  . feeding supplement (ENSURE ENLIVE)  237 mL Oral BID BM  . ipratropium-albuterol  3 mL Nebulization TID  . latanoprost  1 drop Both Eyes QHS  . levofloxacin  500 mg Oral Daily  . methylPREDNISolone (SOLU-MEDROL) injection  60 mg Intravenous Q12H  . metoprolol  25 mg Oral BID  . pantoprazole  40 mg Oral Daily  . polyethylene glycol  17 g Oral Daily  . sodium chloride flush  3 mL Intravenous Q12H   Continuous Infusions:   Active Problems:   S/P AVR   COPD exacerbation (HCC)   Acute respiratory failure with hypoxia (HCC)   Weakness   Left lower quadrant pain   Weakness generalized   Weight loss   Lung mass   PAF (paroxysmal atrial fibrillation) (HCC)   Protein-calorie malnutrition, severe    Time spent: 45 minutes   [facility] Pager [phone]. If 7PM-7AM, please contact night-coverage at [email or web address], password [facility] 12/14/2015, 11:03 AM  LOS: 3 days

## 2015-12-14 NOTE — Progress Notes (Signed)
Patient ID: [name], male   DOB: 1930-07-16, 82 y.o.   MRN: [id number]     Subjective:    Still with some SOB this AM  Objective:   Temp:  [97.5 F (36.4 C)-98.7 F (37.1 C)] 97.5 F (36.4 C) (03/05 0552) Pulse Rate:  [72-100] 72 (03/05 0601) Resp:  [18-24] 24 (03/05 0552) BP: (116-195)/(69-99) 116/99 mmHg (03/05 0601) SpO2:  [97 %-100 %] 98 % (03/05 0552) Weight:  [138 lb (62.596 kg)] 138 lb (62.596 kg) (03/05 0552) Last BM Date: 12/12/15  Filed Weights   12/13/15 0718 12/14/15 0552  Weight: 136 lb 9.6 oz (61.961 kg) 138 lb (62.596 kg)    Intake/Output Summary (Last 24 hours) at 12/14/15 0804 Last data filed at 12/14/15 0529  Gross per 24 hour  Intake    160 ml  Output   1675 ml  Net  -1515 ml    Telemetry: NSR  Exam:  General: NAD  HEENT: sclera clear, throat clear  Resp: clear anteriorally  Cardiac: RRR, mechanical S2, no m/r/g, no jvd  GI: abdomen soft, NT, ND  MSK: no LE edema  Neuro: no focal deficits  Psych: appropriate affect  Lab Results:  Basic Metabolic Panel:  Recent Labs Lab 12/11/15 1333 12/12/15 0637 12/13/15 0614  NA 139 137 136  K 4.1 4.1 4.0  CL 105 104 102  CO2 '25 24 27  '$ GLUCOSE 135* 144* 129*  BUN 14 15 23*  CREATININE 1.15 1.11 1.11  CALCIUM 8.9 9.0 9.1    Liver Function Tests:  Recent Labs Lab 12/11/15 1333 12/13/15 0614  AST 17 28  ALT 12* 17  ALKPHOS 126 115  BILITOT 0.8 0.7  PROT 6.9 6.8  ALBUMIN 2.8* 2.8*    CBC:  Recent Labs Lab 12/11/15 1333 12/12/15 0637 12/13/15 0614  WBC 7.3 8.8 10.9*  HGB 11.0* 10.8* 10.9*  HCT 34.4* 32.7* 33.6*  MCV 88.4 88.4 88.0  PLT 179 172 204    Cardiac Enzymes: No results for input(s): CKTOTAL, CKMB, CKMBINDEX, TROPONINI in the last 168 hours.  BNP: No results for input(s): PROBNP in the last 8760 hours.  Coagulation:  Recent Labs Lab 12/12/15 0637 12/13/15 0614 12/14/15 0632  INR 3.98* 3.93* 3.28*    ECG:   Medications:   Scheduled  Medications: . aspirin EC  81 mg Oral Daily  . dorzolamide  1 drop Both Eyes BID  . feeding supplement (ENSURE ENLIVE)  237 mL Oral BID BM  . ipratropium-albuterol  3 mL Nebulization TID  . latanoprost  1 drop Both Eyes QHS  . levofloxacin (LEVAQUIN) IV  500 mg Intravenous Q24H  . methylPREDNISolone (SOLU-MEDROL) injection  60 mg Intravenous Q12H  . metoprolol  25 mg Oral BID  . pantoprazole  40 mg Oral Daily  . polyethylene glycol  17 g Oral Daily  . sodium chloride flush  3 mL Intravenous Q12H     Infusions:     PRN Medications:  acetaminophen **OR** acetaminophen, HYDROcodone-acetaminophen, levalbuterol, ondansetron **OR** ondansetron (ZOFRAN) IV     Assessment/Plan    1. Aortic stenosis - history of mechanical AVR - limited echo 12/2015 with LVEF 60-65%. Echo 03/2014 LVEF 55-60%, normal functioning AVR.  - will review echo images from this admit, appears likely however his symptoms are primary pulmonary etiology as opposed to cardiac.   2. CAD - history of prior CABG - trop neg x, 1, EKG with LVH and chronic strain pattern - no chest pain, no further workup at  this time  3. Lung mass - large 4.3 cm right sided mass suspicious for malignancy. Workup per primary team, if need to hold coumadin for invasive testing would bridge with heparin gtt.   4. Afib - transient episode this admission. He is back in NSR currerntly on lopressor '25mg'$  bid He is already anticoagulated for his mechanical AVR, continue.   5. COPD exacerbation - management per primary team.   6. HTN - bp's are elevated, will start norvasc '5mg'$  daily     Carlyle Dolly, M.D.

## 2015-12-15 ENCOUNTER — Ambulatory Visit: Payer: Medicare Other | Admitting: Gastroenterology

## 2015-12-15 ENCOUNTER — Telehealth: Payer: Self-pay | Admitting: Gastroenterology

## 2015-12-15 DIAGNOSIS — J441 Chronic obstructive pulmonary disease with (acute) exacerbation: Secondary | ICD-10-CM

## 2015-12-15 DIAGNOSIS — R918 Other nonspecific abnormal finding of lung field: Secondary | ICD-10-CM

## 2015-12-15 DIAGNOSIS — R531 Weakness: Secondary | ICD-10-CM

## 2015-12-15 DIAGNOSIS — J9601 Acute respiratory failure with hypoxia: Secondary | ICD-10-CM

## 2015-12-15 DIAGNOSIS — R1032 Left lower quadrant pain: Secondary | ICD-10-CM

## 2015-12-15 LAB — CBC
HCT: 33.1 % — ABNORMAL LOW (ref 39.0–52.0)
Hemoglobin: 11 g/dL — ABNORMAL LOW (ref 13.0–17.0)
MCH: 29.6 pg (ref 26.0–34.0)
MCHC: 33.2 g/dL (ref 30.0–36.0)
MCV: 89 fL (ref 78.0–100.0)
PLATELETS: 159 10*3/uL (ref 150–400)
RBC: 3.72 MIL/uL — AB (ref 4.22–5.81)
RDW: 13.4 % (ref 11.5–15.5)
WBC: 8.8 10*3/uL (ref 4.0–10.5)

## 2015-12-15 LAB — PROTIME-INR
INR: 2.46 — ABNORMAL HIGH (ref 0.00–1.49)
Prothrombin Time: 26.4 seconds — ABNORMAL HIGH (ref 11.6–15.2)

## 2015-12-15 LAB — HEPARIN LEVEL (UNFRACTIONATED): Heparin Unfractionated: 0.1 IU/mL — ABNORMAL LOW (ref 0.30–0.70)

## 2015-12-15 LAB — BASIC METABOLIC PANEL
Anion gap: 8 (ref 5–15)
BUN: 32 mg/dL — ABNORMAL HIGH (ref 6–20)
CHLORIDE: 100 mmol/L — AB (ref 101–111)
CO2: 29 mmol/L (ref 22–32)
CREATININE: 1.1 mg/dL (ref 0.61–1.24)
Calcium: 9.1 mg/dL (ref 8.9–10.3)
GFR calc non Af Amer: >60 mL/min (ref >60)
Glucose, Bld: 141 mg/dL — ABNORMAL HIGH (ref 65–99)
POTASSIUM: 4.4 mmol/L (ref 3.5–5.1)
SODIUM: 137 mmol/L (ref 135–145)

## 2015-12-15 MED ORDER — HEPARIN (PORCINE) IN NACL 100-0.45 UNIT/ML-% IJ SOLN
1100.0000 [IU]/h | INTRAMUSCULAR | Status: AC
  Administered 2015-12-15: 750 [IU]/h via INTRAVENOUS
  Administered 2015-12-16: 1100 [IU]/h via INTRAVENOUS
  Filled 2015-12-15 (×3): qty 250

## 2015-12-15 NOTE — Consult Note (Signed)
Name: [name] MRN: [id number] DOB: 1929-10-18    ADMISSION DATE:  12/11/2015 CONSULTATION DATE:  3/6  REFERRING MD :  Allyson Sabal   CHIEF COMPLAINT:  Lung mass  BRIEF PATIENT DESCRIPTION:  This is a 82 year old male admitted on 3/2 w/ working dx of AECOPD. CT chest obtained to further identify extent of baseline lung disease incidentally  discovered a large infrahilar and RLL lung mass w/ associated airway obstruction. PCCM was asked to assist w/ evaluation.   SIGNIFICANT EVENTS    STUDIES:  CT chest 3/3: 4.3 cm right infrahilar/central right lower lobe mass, suspicious for primary bronchogenic neoplasm. Associated bilateral lower lobe pulmonary nodules, suspicious for metastases.    HISTORY OF PRESENT ILLNESS:   This is a 82 year old male w/ h/o COPD, afib, and AS. Lives at home w/ wife. Stopped smoking > 20 yrs ago. Admitted on 3/2 w/ 6 week h/o progressive cough which was intermittently productive of scant hemoptysis, progressive weakness to the point he did not want to get out of chair, worsening shortness of breath & weight loss. He was admitted w/ working dx of AECOPD. Had a CT scan of chest on 3/3 to further evaluate the extent of his baseline underlying lung disease. A large right hilar/RLL lung mass was identified as well as associated lower lobe nodules and therefore PCCM was consulted to assist w/ evaluation.   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  has past surgical history that includes Coronary artery bypass graft (2001) and Aortic valve replacement (2001). Prior to Admission medications   Medication Sig Start Date End Date Taking? Authorizing Provider  acetaminophen (TYLENOL) 500 MG tablet Take 500 mg by mouth every 6  (six) hours as needed. For pain    Yes Historical Provider, MD  aspirin EC 81 MG tablet Take 81 mg by mouth daily.   Yes Historical Provider, MD  cholecalciferol (VITAMIN D) 1000 UNITS tablet Take 1,000 Units by mouth daily.   Yes Historical Provider, MD  dorzolamide (TRUSOPT) 2 % ophthalmic solution Place 1 drop into both eyes 2 (two) times daily.   Yes Historical Provider, MD  furosemide (LASIX) 20 MG tablet Take 20 mg by mouth daily.   Yes Historical Provider, MD  latanoprost (XALATAN) 0.005 % ophthalmic solution Place 1 drop into both eyes at bedtime.   Yes Historical Provider, MD  LUMIGAN 0.01 % SOLN Place 1 drop into both eyes At bedtime. 12/15/10  Yes Historical Provider, MD  metoprolol (LOPRESSOR) 50 MG tablet TAKE ONE-HALF TABLET BY MOUTH ONCE DAILY Patient taking differently: TAKE ONE-HALF (12.'5mg'$ ) TABLET BY MOUTH ONCE DAILY 12/03/15  Yes [name], MD  omeprazole (PRILOSEC) 20 MG capsule Take 20 mg by mouth daily.   Yes Historical Provider, MD  tiotropium (SPIRIVA) 18 MCG inhalation capsule Place 18 mcg into inhaler and inhale daily.     Yes Historical Provider, MD  warfarin (COUMADIN) 5 MG tablet Take as directed by coumadin clinic Patient taking differently: Take 5 mg by mouth daily at 6 PM. Take as directed by coumadin clinic 07/25/15  Yes [name], MD  brinzolamide (AZOPT) 1 % ophthalmic suspension Place 1 drop into both eyes 2 (two) times daily. Reported on 12/11/2015    Historical Provider, MD   Allergies  Allergen Reactions  .  Penicillins Anaphylaxis and Rash    FAMILY HISTORY:  family history includes Colon cancer in his brother; Emphysema in his brother; Heart attack in his father; Stroke in his mother. SOCIAL HISTORY:  reports that he quit smoking about 16 years ago. His smoking use included Cigarettes. He has a 30 pack-year smoking history. He has never used smokeless tobacco. He reports that he does not drink alcohol or use illicit drugs. Retired Psychologist, sport and exercise  REVIEW  OF SYSTEMS:   Constitutional: Negative for fever, chills, ++weight loss-->significant over last 4 weeks, malaise/fatigued more easily-->over last 6 weeks,  No diaphoresis.  HENT: chronic hearing loss, no ear pain, nosebleeds, congestion, sore throat, neck pain, tinnitus and ear discharge.   Eyes: Negative for blurred vision, double vision, photophobia, pain, discharge and redness.  Respiratory: cough present for last 6 weeks-->worse w/ associated weakness, streaky hemoptysis @ times,  shortness of breath, some wheezing and no  stridor.   Cardiovascular: Negative for chest pain, palpitations, orthopnea, claudication, leg swelling and PND.  Gastrointestinal: Negative for heartburn, nausea, vomiting, abdominal pain, diarrhea, constipation, blood in stool and melena.  Genitourinary: Negative for dysuria, urgency, frequency, hematuria and flank pain.  Musculoskeletal: Negative for myalgias, back pain, joint pain and falls.  Skin: Negative for itching and rash.  Neurological: Negative for dizziness, tingling, tremors, sensory change, speech change, focal weakness, seizures, loss of consciousness, weakness and headaches.  Endo/Heme/Allergies: Negative for environmental allergies and polydipsia. Does not bruise/bleed easily.  SUBJECTIVE:  No distress   VITAL SIGNS: Temp:  [97.6 F (36.4 C)-98.3 F (36.8 C)] 97.6 F (36.4 C) (03/06 0446) Pulse Rate:  [65-94] 65 (03/06 0446) Resp:  [18] 18 (03/06 0446) BP: (126-157)/(63-78) 157/78 mmHg (03/06 0446) SpO2:  [94 %-98 %] 94 % (03/06 0446) Weight:  [138 lb 9.6 oz (62.869 kg)] 138 lb 9.6 oz (62.869 kg) (03/06 0440)  PHYSICAL EXAMINATION: General:  Frail 82 year old male, not in distress. Comfortable but confused.  Neuro:  Awake, very hard of hearing. Has some memory deficits. No focal strength/motor def  HEENT:  Eyes are sunken in. MMM, no JVD + temporal wasting  Cardiovascular:  RRR w/ + mechanical click  Lungs:  Clear w/out accessory muscle use   Abdomen:  Soft, not tender + bowel sounds  Musculoskeletal:  Equal st and bulk  Skin:  Warm dry   Recent Labs Lab 12/12/15 0637 12/13/15 0614 12/15/15 0543  NA 137 136 137  K 4.1 4.0 4.4  CL 104 102 100*  CO2 '24 27 29  '$ BUN 15 23* 32*  CREATININE 1.11 1.11 1.10  GLUCOSE 144* 129* 141*    Recent Labs Lab 12/12/15 0637 12/13/15 0614 12/15/15 0543  HGB 10.8* 10.9* 11.0*  HCT 32.7* 33.6* 33.1*  WBC 8.8 10.9* 8.8  PLT 172 204 159   Mr Brain W Wo Contrast  12/14/2015  CLINICAL DATA:  Acute presentation with generalized weakness. Recent diagnosis of lung mass. EXAM: MRI HEAD WITHOUT AND WITH CONTRAST TECHNIQUE: Multiplanar, multiecho pulse sequences of the brain and surrounding structures were obtained without and with intravenous contrast. CONTRAST:  29m MULTIHANCE GADOBENATE DIMEGLUMINE 529 MG/ML IV SOLN COMPARISON:  None. FINDINGS: Diffusion imaging does not show any acute or subacute infarction. There are chronic small-vessel ischemic changes of the pons. No cerebellar insult. Cerebral hemispheres show generalized atrophy with mild chronic small-vessel disease of the deep white matter. No cortical or large vessel territory infarction. No evidence of primary or metastatic mass lesion, hemorrhage, hydrocephalus or extra-axial collection. There is  considerable motion degradation on some of the postcontrast imaging. No pituitary mass. There is an opacified posterior ethmoid air cell on the right. Major vessels at the base of the brain show flow. No skull or skullbase lesion is seen. IMPRESSION: No evidence of acute infarction. No evidence of metastatic disease. Atrophy and chronic small vessel ischemic change, less than often seen in healthy individuals of this age. Electronically Signed   By: Nelson Chimes M.D.   On: 12/14/2015 14:44    ASSESSMENT / PLAN:  Acute Hypoxic Respiratory Failure in setting of post-obstructive PNA and AECOPD-->clinically improved.  Plan Cont abx Taper  steroids Wean FIO2 Cont BDs  Large infrahilar RLL lung mass w/ airway narrowing of the RML and RLL bronchus. Associated Lower lobe nodules.  ->almost certainly a cancer. He might be a candidate for palliative XRT, but given the likelihood that this is a metastatic process and his poor functional baseline status and life views not certain that family would choose to pursue this. Also concerned about risk associated w/ bronchoscopy. Family seems open to hospice which is likely the better choice in this situation, but wants the son to weigh in.  Plan Cont heparin for now If we were to proceed would need bronch (would consider this high risk given his poor state) Will meet w/ pt's son on 3/7. Suspect that they would be open to hospice/palliative approach over aggressive approach   Delirium. ? Exacerbated by sleep disturbance & systemic steroids Plan Decrease solumedrol Cont supportive care  AS and AF Plan Cont rate control Cont heparin for now Likely resume coumadin 3/7 if no diagnostics to be done    All other issues per primary   Erick Colace ACNP-BC [place] Pager # [phone] OR # [phone] if no answer    12/15/2015, 9:37 AM   STAFF NOTE: I, Merrie Roof, MD FACP have personally reviewed patient's available data, including medical history, events of note, physical examination and test results as part of my evaluation. I have discussed with resident/NP and other care providers such as pharmacist, RN and RRT. In addition, I personally evaluated patient and elicited key findings of: no distress, hard of hearing, cachectic appearing, rhonchi and reduced BS rt, CT reviewed and all films, highly suspicious malignancy, this would be easily bronch BX able with standard bronch, however have concerns on tolerability and benefit from therapy and tolerability of therapy, will have meetign with son with PEte 3/7 to chime in , his functional status is poor and concerns  for outcome regardless of heroics, updated pt and wife   Lavon Paganini. Titus Mould, MD, [place] Pgr: [facility] 12/15/2015 3:16 PM

## 2015-12-15 NOTE — Progress Notes (Signed)
[facility] PROGRESS NOTE  [name] MGN:[id number] DOB: Sep 22, 1930 DOA: 12/11/2015 PCP: [name], MD  Length of stay: 4   Assessment/Plan: Active Problems:   S/P AVR   COPD exacerbation ([place])   Acute respiratory failure with hypoxia (H[facility])   Weakness   Left lower quadrant pain   Weakness generalized   Weight loss   Lung mass   PAF (paroxysmal atrial fibrillation) (HCC)   Protein-calorie malnutrition, severe    Brief summary 82 y.o. male with a hx of CAD, aortic stenosis, s/p mechanical [employer] AVR + 1v CABG (S-RCA) in 2001, ILD, coumadin rx. Myoview (12/06): Normal, EF 70%. Echo (02/2012): EF 60-65%, mechanical AVR ok with mean gradient 11 mmHg. Last seen by me in 03/2013. He continues to note chronic DOE. This is unchanged. Previously with NYHA Class 2b symptoms. No orthopnea, PND, edema. Presents to the ER with shortness of breath and weakness, patient noted getting weaker and having more shortness of breath on Tuesday, also has a nonproductive cough. Chest x-ray shows pulmonary fibrosis and chronic interstitial disease. EKG shows atrial fibrillation. Patient also found to have a new lung mass this admission    Assessment and plan Acute respiratory failure with hypoxia likely secondary to COPD exacerbation. CXR without any acute findings or suggestion of heart failure.  Continue telemetry -Continue 02 via  [place] to maintain sats 88-92%. Currently requiring 4 liters. Patient was not on oxygen at home. ABG shows pH of 7.37, PCO2 36.7, PO2 of 76.0 -Scheduled Duonebs Q4 hours with Albuterol q2 prn continue  Levaquin for COPD exacerbation ,   Continue Solumedrol '60mg'$  IV BID and reassess need for ongoing steroids tomorrow.  -H2 blocker or continue home PPI CT of the chest to rule out interstitial lung disease shows a new lung mass,   Atrial fibrillation, rate controlled.Chadvasc 4.  This seems to be new onset A-fib or paroxysmal at best   Cardiology consulted , 12/2015 with LVEF 60-65%. Echo 03/2014 LVEF 55-60%, normal functioning AVR -on coumadin for mechanical valve, goal 2.5-3.5  transient episode this admission. The patient is on lopressor '25mg'$   bid dosing. He is already anticoagulated for his mechanical AVR, continue.   Lung mass - large 4.3 cm right sided mass suspicious for malignancy. Workup per primary team, if need to hold coumadin for invasive testing would bridge with heparin gtt.  Pulmonary consult  requested , bronchoscopy once INR is below 2.0   MRI of the brain negative for metastasis Family to make a decision about pursuing further workup including tissue diagnosis vs palliative radiation vs   comfort care measures Pulmonary to have another meeting with the son on 3/7   Aortic valve replacement - mechanical [employer]. On chronic coumadin. INR supratherapeutic . Pharmacy consulted for coumadin management , bridge with heparin when INR is less than 2.5 , goal INR 2.5-3.5   Weakness / Protein calorie malnutrition with weight loss related to chronic illness, malnutrition -PT evaluation -Dietician consult -continue Ensure for now   Left lower quadrant pain. Really more of a discomfort over the last 2 weeks. Son gives history of recent constipation which sounds unresolved despite a laxative at home.  continue MiraLAX as tolerated.  Could consider CT abdomen pelvis for  Complete staging   DVT prophylaxsis Coumadin  Code Status:      Code Status Orders     Partial code    Start     Ordered     Family  Communication: Discussed in detail with the patient, all imaging results, lab results explained to the patient  Discussed with daughter Helene Kelp [phone], [place], [place] [phone] Disposition Plan: pulmonary meeting with family tomorrow to make a final decision on 3/7     Consultants:  Cardiology  Procedures:  None  Antibiotics: Anti-infectives    Start      Dose/Rate Route Frequency Ordered Stop   12/14/15 1115  levofloxacin (LEVAQUIN) tablet 500 mg     500 mg Oral Daily 12/14/15 1102     12/12/15 0830  levofloxacin (LEVAQUIN) IVPB 500 mg  Status:  Discontinued     500 mg 100 mL/hr over 60 Minutes Intravenous Every 24 hours 12/12/15 0734 12/14/15 1102   12/11/15 2000  doxycycline (VIBRAMYCIN) 100 mg in dextrose 5 % 250 mL IVPB  Status:  Discontinued     100 mg 125 mL/hr over 120 Minutes Intravenous Every 12 hours 12/11/15 1741 12/12/15 0734         HPI/Subjective  Resting comfortably in bed, no chest pain   Objective: Filed Vitals:   12/15/15 0217 12/15/15 0440 12/15/15 0446 12/15/15 1303  BP: 143/76  157/78 135/74  Pulse: 81  65 66  Temp:   97.6 F (36.4 C) 97.6 F (36.4 C)  TempSrc:   Oral   Resp:   18 20  Weight:  62.869 kg (138 lb 9.6 oz)    SpO2:   94% 100%    Intake/Output Summary (Last 24 hours) at 12/15/15 1307 Last data filed at 12/15/15 0900  Gross per 24 hour  Intake    240 ml  Output    250 ml  Net    -10 ml    Exam:  General: No acute respiratory distress, cachectic  Lungs: Clear to auscultation bilaterally without wheezes or crackles Cardiovascular: Irregularly irregular, without murmur gallop or rub normal S1 and S2 Abdomen: Nontender, nondistended, soft, bowel sounds positive, no rebound, no ascites, no appreciable mass Extremities: No significant cyanosis, clubbing, or edema bilateral lower extremities     Data Review   Micro Results No results found for this or any previous visit (from the past 240 hour(s)).  Radiology Reports Dg Chest 2 View  12/11/2015  CLINICAL DATA:  Shortness of breath, weakness and cough for a while, some blood in stool, smoker, COPD, emphysema, coronary disease post bypass, hypertension EXAM: CHEST  2 VIEW COMPARISON:  12/09/2015 FINDINGS: Normal size of cardiac silhouette post AVR. Atherosclerotic calcification aorta. Question enlarged central pulmonary arteries which  could reflect pulmonary arterial hypertension. Emphysematous and bronchitic changes consistent with COPD. Chronic interstitial lung disease changes in the mid to lower lungs bilaterally similar to previous exam likely representing fibrosis. No definite superimposed acute infiltrate, pleural effusion or pneumothorax. Bones diffusely demineralized. IMPRESSION: Post AVR. COPD changes with chronic interstitial disease/pulmonary fibrosis. No acute abnormalities. Electronically Signed   By: Lavonia Dana M.D.   On: 12/11/2015 13:55   Dg Chest 2 View  12/09/2015  CLINICAL DATA:  Weight loss, severe emphysema and pulmonary fibrosis. Status post aortic valve replacement. EXAM: CHEST  2 VIEW COMPARISON:  60 in 2015, 03/21/2014 FINDINGS: Chronic background severe emphysema pattern and mid and lower lung interstitial fibrosis. Interval resolution of the previous right upper lobe spiculated airspace opacity, compatible with infectious/inflammatory process. No current superimposed edema or pneumonia. No effusion or pneumothorax. Trachea midline. Prior coronary bypass also evident as well as aortic valve replacement. Atherosclerosis of the aorta. Chronic  appearing mid thoracic mild compression fracture. IMPRESSION: Chronic background severe emphysema and mid and lower lung interstitial fibrosis. Interval resolution of the right upper lobe focal spiculated airspace process. Suspect infectious/inflammatory process. No current acute process. Electronically Signed   By: Jerilynn Mages.  Shick M.D.   On: 12/09/2015 15:13   Ct Chest Wo Contrast  12/12/2015  CLINICAL DATA:  Shortness breath, nonproductive cough. History of pulmonary fibrosis. EXAM: CT CHEST WITHOUT CONTRAST TECHNIQUE: Multidetector CT imaging of the chest was performed following the standard protocol without IV contrast. COMPARISON:  Chest radiographs dated 12/11/2015. CT chest dated 03/21/2014. FINDINGS: Mediastinum/Nodes: The heart is normal in size. No pericardial effusion.  Fluid in the superior pericardial recess. Coronary atherosclerosis. Postsurgical changes related to prior CABG. Prosthetic aortic valve. Atherosclerotic calcifications of the aortic arch. 4.6 cm ascending thoracic aortic aneurysm (series 2/image 38). 12 mm short axis right paratracheal node (series 2/ image 26), previously 10 mm. Lungs/Pleura: 4.0 x 2.9 x 4.3 cm infrahilar/ central right lower lobe mass narrowing the right middle lobe and right lower lobe bronchus, suspicious for primary bronchogenic neoplasm. 16 x 9 mm subpleural nodule in the medial right lower lobe (series 3/ image 38), new. 9 mm nodule in the superior segment left lower lobe (series 3/image 36), new. Additional mild patchy/nodular opacity in the superior segment right lower lobe (series 3/image 31), favored to reflect postobstructive opacity. Lower lobe predominant bronchiectasis with bronchial wall thickening. Associated subpleural reticulation/ fibrosis in the bilateral lower lobes. Trace right pleural effusion. Underlying moderate centrilobular and paraseptal emphysematous changes. No pneumothorax. Upper abdomen: Hyperdense left adrenal nodule (series 2/image 60), incompletely visualized, chronic. Right upper pole renal cyst (series 2/image 60), incompletely visualized, chronic. Musculoskeletal: Degenerative changes of the visualized thoracolumbar spine. Mild wedging of three mid thoracic vertebral bodies (series 5/image 59), T6-T8, mildly progressed since 2015. IMPRESSION: 4.3 cm right infrahilar/central right lower lobe mass, suspicious for primary bronchogenic neoplasm. Associated bilateral lower lobe pulmonary nodules, suspicious for metastases. Consider bronchoscopy for tissue confirmation. PET-CT may be useful for staging. Electronically Signed   By: Julian Hy M.D.   On: 12/12/2015 16:01   Mr Jeri Cos AS Contrast  12/14/2015  CLINICAL DATA:  Acute presentation with generalized weakness. Recent diagnosis of lung mass. EXAM: MRI  HEAD WITHOUT AND WITH CONTRAST TECHNIQUE: Multiplanar, multiecho pulse sequences of the brain and surrounding structures were obtained without and with intravenous contrast. CONTRAST:  78m MULTIHANCE GADOBENATE DIMEGLUMINE 529 MG/ML IV SOLN COMPARISON:  None. FINDINGS: Diffusion imaging does not show any acute or subacute infarction. There are chronic small-vessel ischemic changes of the pons. No cerebellar insult. Cerebral hemispheres show generalized atrophy with mild chronic small-vessel disease of the deep white matter. No cortical or large vessel territory infarction. No evidence of primary or metastatic mass lesion, hemorrhage, hydrocephalus or extra-axial collection. There is considerable motion degradation on some of the postcontrast imaging. No pituitary mass. There is an opacified posterior ethmoid air cell on the right. Major vessels at the base of the brain show flow. No skull or skullbase lesion is seen. IMPRESSION: No evidence of acute infarction. No evidence of metastatic disease. Atrophy and chronic small vessel ischemic change, less than often seen in healthy individuals of this age. Electronically Signed   By: MNelson ChimesM.D.   On: 12/14/2015 14:44     CBC  Recent Labs Lab 12/11/15 1333 12/12/15 0637 12/13/15 0614 12/15/15 0543  WBC 7.3 8.8 10.9* 8.8  HGB 11.0* 10.8* 10.9* 11.0*  HCT 34.4* 32.7* 33.6*  33.1*  PLT 179 172 204 159  MCV 88.4 88.4 88.0 89.0  MCH 28.3 29.2 28.5 29.6  MCHC 32.0 33.0 32.4 33.2  RDW 13.3 13.5 13.5 13.4    Chemistries   Recent Labs Lab 12/11/15 1333 12/12/15 0637 12/13/15 0614 12/15/15 0543  NA 139 137 136 137  K 4.1 4.1 4.0 4.4  CL 105 104 102 100*  CO2 '25 24 27 29  '$ GLUCOSE 135* 144* 129* 141*  BUN 14 15 23* 32*  CREATININE 1.15 1.11 1.11 1.10  CALCIUM 8.9 9.0 9.1 9.1  AST 17  --  28  --   ALT 12*  --  17  --   ALKPHOS 126  --  115  --   BILITOT 0.8  --  0.7  --     ------------------------------------------------------------------------------------------------------------------ estimated creatinine clearance is 45.3 mL/min (by C-G formula based on Cr of 1.1). ------------------------------------------------------------------------------------------------------------------ No results for input(s): HGBA1C in the last 72 hours. ------------------------------------------------------------------------------------------------------------------ No results for input(s): CHOL, HDL, LDLCALC, TRIG, CHOLHDL, LDLDIRECT in the last 72 hours. ------------------------------------------------------------------------------------------------------------------ No results for input(s): TSH, T4TOTAL, T3FREE, THYROIDAB in the last 72 hours.  Invalid input(s): FREET3 ------------------------------------------------------------------------------------------------------------------ No results for input(s): VITAMINB12, FOLATE, FERRITIN, TIBC, IRON, RETICCTPCT in the last 72 hours.  Coagulation profile  Recent Labs Lab 12/11/15 1333 12/12/15 0637 12/13/15 0614 12/23/15 0632 12/15/15 0543  INR 4.76* 3.98* 3.93* 3.28* 2.46*    No results for input(s): DDIMER in the last 72 hours.  Cardiac Enzymes No results for input(s): CKMB, TROPONINI, MYOGLOBIN in the last 168 hours.  Invalid input(s): CK ------------------------------------------------------------------------------------------------------------------ Invalid input(s): POCBNP   CBG:  Recent Labs Lab 12/13/15 0814 12/13/15 1229 12/13/15 1701  GLUCAP 123* 131* 166*       Studies: Mr Kizzie Fantasia Contrast  12/23/15  CLINICAL DATA:  Acute presentation with generalized weakness. Recent diagnosis of lung mass. EXAM: MRI HEAD WITHOUT AND WITH CONTRAST TECHNIQUE: Multiplanar, multiecho pulse sequences of the brain and surrounding structures were obtained without and with intravenous contrast. CONTRAST:  69m  MULTIHANCE GADOBENATE DIMEGLUMINE 529 MG/ML IV SOLN COMPARISON:  None. FINDINGS: Diffusion imaging does not show any acute or subacute infarction. There are chronic small-vessel ischemic changes of the pons. No cerebellar insult. Cerebral hemispheres show generalized atrophy with mild chronic small-vessel disease of the deep white matter. No cortical or large vessel territory infarction. No evidence of primary or metastatic mass lesion, hemorrhage, hydrocephalus or extra-axial collection. There is considerable motion degradation on some of the postcontrast imaging. No pituitary mass. There is an opacified posterior ethmoid air cell on the right. Major vessels at the base of the brain show flow. No skull or skullbase lesion is seen. IMPRESSION: No evidence of acute infarction. No evidence of metastatic disease. Atrophy and chronic small vessel ischemic change, less than often seen in healthy individuals of this age. Electronically Signed   By: MNelson ChimesM.D.   On: [id number]      No results found for: HGBA1C Lab Results  Component Value Date   CREATININE 1.10 12/15/2015       Scheduled Meds: . amLODipine  5 mg Oral Daily  . aspirin EC  81 mg Oral Daily  . dorzolamide  1 drop Both Eyes BID  . feeding supplement (ENSURE ENLIVE)  237 mL Oral BID BM  . ipratropium-albuterol  3 mL Nebulization TID  . latanoprost  1 drop Both Eyes QHS  . levofloxacin  500 mg Oral Daily  . methylPREDNISolone (SOLU-MEDROL) injection  60  mg Intravenous Q12H  . metoprolol  25 mg Oral BID  . pantoprazole  40 mg Oral Daily  . polyethylene glycol  17 g Oral Daily  . sodium chloride flush  3 mL Intravenous Q12H   Continuous Infusions: . heparin 750 Units/hr (12/15/15 0815)    Active Problems:   S/P AVR   COPD exacerbation (HCC)   Acute respiratory failure with hypoxia (HCC)   Weakness   Left lower quadrant pain   Weakness generalized   Weight loss   Lung mass   PAF (paroxysmal atrial fibrillation)  (HCC)   Protein-calorie malnutrition, severe    Time spent: 45 minutes   [facility] Pager [phone]. If 7PM-7AM, please contact night-coverage at [email or web address], password [facility] 12/15/2015, 1:07 PM  LOS: 4 days

## 2015-12-15 NOTE — Progress Notes (Addendum)
ANTICOAGULATION CONSULT NOTE - Follow Up Consult  Pharmacy Consult for heparin Indication: Mechanical valve/Afib  Allergies  Allergen Reactions  . Penicillins Anaphylaxis and Rash    Patient Measurements: Weight: 138 lb 9.6 oz (62.869 kg) Heparin Dosing Weight: 62.9 kg  Vital Signs: Temp: 97.6 F (36.4 C) (03/06 0446) Temp Source: Oral (03/06 0446) BP: 157/78 mmHg (03/06 0446) Pulse Rate: 65 (03/06 0446)  Labs:  Recent Labs  12/13/15 0614 12/14/15 5993 12/15/15 0543  HGB 10.9*  --  11.0*  HCT 33.6*  --  33.1*  PLT 204  --  159  LABPROT 37.5* 32.7* 26.4*  INR 3.93* 3.28* 2.46*  CREATININE 1.11  --  1.10    Estimated Creatinine Clearance: 45.3 mL/min (by C-G formula based on Cr of 1.1).   Medications:  Prescriptions prior to admission  Medication Sig Dispense Refill Last Dose  . acetaminophen (TYLENOL) 500 MG tablet Take 500 mg by mouth every 6 (six) hours as needed. For pain    Past Month at Unknown time  . aspirin EC 81 MG tablet Take 81 mg by mouth daily.   12/11/2015 at Unknown time  . cholecalciferol (VITAMIN D) 1000 UNITS tablet Take 1,000 Units by mouth daily.   12/10/2015 at Unknown time  . dorzolamide (TRUSOPT) 2 % ophthalmic solution Place 1 drop into both eyes 2 (two) times daily.   12/10/2015 at Unknown time  . furosemide (LASIX) 20 MG tablet Take 20 mg by mouth daily.   12/10/2015 at Unknown time  . latanoprost (XALATAN) 0.005 % ophthalmic solution Place 1 drop into both eyes at bedtime.   12/10/2015 at Unknown time  . LUMIGAN 0.01 % SOLN Place 1 drop into both eyes At bedtime.   12/10/2015 at Unknown time  . metoprolol (LOPRESSOR) 50 MG tablet TAKE ONE-HALF TABLET BY MOUTH ONCE DAILY (Patient taking differently: TAKE ONE-HALF (12.'5mg'$ ) TABLET BY MOUTH ONCE DAILY) 45 tablet 0 12/11/2015 at 0800  . omeprazole (PRILOSEC) 20 MG capsule Take 20 mg by mouth daily.   12/10/2015 at Unknown time  . tiotropium (SPIRIVA) 18 MCG inhalation capsule Place 18 mcg into inhaler and  inhale daily.     12/10/2015 at Unknown time  . warfarin (COUMADIN) 5 MG tablet Take as directed by coumadin clinic (Patient taking differently: Take 5 mg by mouth daily at 6 PM. Take as directed by coumadin clinic) 45 tablet 3 12/10/2015 at 1800  . brinzolamide (AZOPT) 1 % ophthalmic suspension Place 1 drop into both eyes 2 (two) times daily. Reported on 12/11/2015   Not Taking at Unknown time   Scheduled:  . amLODipine  5 mg Oral Daily  . aspirin EC  81 mg Oral Daily  . dorzolamide  1 drop Both Eyes BID  . feeding supplement (ENSURE ENLIVE)  237 mL Oral BID BM  . ipratropium-albuterol  3 mL Nebulization TID  . latanoprost  1 drop Both Eyes QHS  . levofloxacin  500 mg Oral Daily  . methylPREDNISolone (SOLU-MEDROL) injection  60 mg Intravenous Q12H  . metoprolol  25 mg Oral BID  . pantoprazole  40 mg Oral Daily  . polyethylene glycol  17 g Oral Daily  . sodium chloride flush  3 mL Intravenous Q12H    Assessment: 82 year old male on Coumadin PTA for valve, needs workup for lung mass Coumadin on hold - starting heparin now that INR < 2.5  Goal of Therapy:  Heparin level 0.3-0.7 units/ml Monitor platelets by anticoagulation protocol: Yes   Plan:  Start heparin infusion at 750 units/hr Check anti-Xa level in 8 hours and daily while on heparin Continue to monitor H&H and platelets  Thank you for allowing [place] to participate in this patients care. Jens Som, PharmD Pager: [phone] 12/15/2015,8:10 AM

## 2015-12-15 NOTE — Progress Notes (Signed)
ANTICOAGULATION CONSULT NOTE - Follow Up Consult  Pharmacy Consult for heparin Indication: atrial fibrillation and mechanical AVR  Allergies  Allergen Reactions  . Penicillins Anaphylaxis and Rash    Patient Measurements: Weight: 138 lb 9.6 oz (62.869 kg) Heparin Dosing Weight: 62.9 kg  Vital Signs: Temp: 97.6 F (36.4 C) (03/06 1303) BP: 135/74 mmHg (03/06 1303) Pulse Rate: 66 (03/06 1303)  Labs:  Recent Labs  12/13/15 0614 12/14/15 0632 12/15/15 0543 12/15/15 1630  HGB 10.9*  --  11.0*  --   HCT 33.6*  --  33.1*  --   PLT 204  --  159  --   LABPROT 37.5* 32.7* 26.4*  --   INR 3.93* 3.28* 2.46*  --   HEPARINUNFRC  --   --   --  0.10*  CREATININE 1.11  --  1.10  --     Estimated Creatinine Clearance: 45.3 mL/min (by C-G formula based on Cr of 1.1).   Medications:  Infusions:  . heparin 750 Units/hr (12/15/15 0815)    Assessment: 82 y/o male with lung cancer on warfarin PTA for mechanical AVR and now with transient Afib. Warfarin is on hold for possible bronchoscopy. Heparin level is subtherapeutic at 0.1 on 750 units/hr. INR <2.5. No bleeding noted. No problems with infusion or bleeding per RN.  Goal of Therapy:  Heparin level 0.3-0.7 units/ml Monitor platelets by anticoagulation protocol: Yes   Plan:  - Increase heparin drip to 950 units/hr - 8 hr HL - Daily HL and [place], Pharm.D., BCPS Clinical Pharmacist Pager: [phone] 12/15/2015 5:42 PM

## 2015-12-15 NOTE — Progress Notes (Signed)
Occupational Therapy Treatment Patient Details Name: [name] MRN: [id number] DOB: 1929-12-19 Today's Date: 12/15/2015    History of present illness Pt admitted with acute respiratory failure with hypoxia due to COPD exacerbation, new onset afib. PMH: CAD, COPD, AVR, CABG.   OT comments  Patient making good progress towards OT goals. Pt sounded out of breath entire session. However, patient's sats ranged from 93-96% on RA during rest and activity.    Follow Up Recommendations  Home health OT;Supervision/Assistance - 24 hour    Equipment Recommendations  3 in 1 bedside comode    Recommendations for Other Services  None at this time  Precautions / Restrictions Precautions Precautions: Fall Restrictions Weight Bearing Restrictions: No    Mobility Bed Mobility Overal bed mobility: Modified Independent General bed mobility comments: HOB slightly elevated and minimal use of bed rails  Transfers Overall transfer level: Needs assistance Equipment used: Rolling walker (2 wheeled) Transfers: Sit to/from Stand Sit to Stand: Min guard General transfer comment: Assist for balance and safety    Balance Overall balance assessment: Needs assistance Sitting-balance support: No upper extremity supported;Feet supported Sitting balance-Leahy Scale: Good     Standing balance support: Bilateral upper extremity supported;During functional activity Standing balance-Leahy Scale: Fair   ADL Overall ADL's : Needs assistance/impaired Eating/Feeding: Set up;Sitting   Grooming: Set up;Supervision/safety;Sitting Grooming Details (indicate cue type and reason): at sink Upper Body Bathing: Set up;Sitting   Lower Body Bathing: Sit to/from stand;Min guard   Upper Body Dressing : Set up;Sitting   Lower Body Dressing: Min guard;Sit to/from stand   Toilet Transfer: Min guard;RW;BSC;Ambulation     Toileting - Clothing Manipulation Details (indicate cue type and reason): did not occur    Tub/Shower Transfer Details (indicate cue type and reason): did not occur Functional mobility during ADLs: Maximal assistance;Rolling walker General ADL Comments: Pt found supine in bed with wife and other family members present. Therapist asked family to step out to be able to work with patient. Pt engaged in bed mobility, sat EOB for LB ADLs. Pt then ambulated to sink for grooming tasks of combing hair and brushing teeth. Pt sat on [facility] for this, as he felt he wasn't strong enough to stand entire time. Asked wife to step back in room to go over ADLs with them together. Educated pt and wife on using 3-n-1 in shower for safety with showers and using 3-n-1 over toilet seat. Also educated both on energy conservation techniques, seated rest breaks and pursed lip breathing.            Cognition   Behavior During Therapy: WFL for tasks assessed/performed Overall Cognitive Status: History of cognitive impairments - at baseline       Memory: Decreased short-term memory                 Pertinent Vitals/ Pain       Pain Assessment: No/denies pain   Frequency Min 2X/week     Progress Toward Goals  OT Goals(current goals can now befound in the care plan section)  Progress towards OT goals: Progressing toward goals  Acute Rehab OT Goals Patient Stated Goal: none stated OT Goal Formulation: With patient/family Time For Goal Achievement: 12/26/15 Potential to Achieve Goals: Good  Plan Discharge plan remains appropriate    End of Session Equipment Utilized During Treatment: Oxygen;Gait belt;Rolling walker   Activity Tolerance Patient tolerated treatment well   Patient Left in chair;with call bell/phone within reach;with chair alarm set;with family/visitor present;with nursing/sitter in  room  Nurse Communication Mobility status        Time: 0677-0340 OT Time Calculation (min): 30 min  Charges: OT General Charges $OT Visit: 1 Procedure OT Treatments $Self Care/Home Management  : 23-37 mins  Chrys Racer , MS, OTR/L, CLT Pager: [phone]  12/15/2015, 2:55 PM

## 2015-12-15 NOTE — Care Management Important Message (Signed)
Important Message  Patient Details  Name: [name] MRN: [id number] Date of Birth: 05/18/1930   Medicare Important Message Given:  Yes    Nathen May 12/15/2015, 2:27 PM

## 2015-12-15 NOTE — Progress Notes (Signed)
Patient ID: [name], male   DOB: November 12, 1929, 82 y.o.   MRN: [id number]  Rene Paci known Berthel for a long time He has lost a lot of weight and has poor functional capacity Metastatic Lung CA Discussed options with wife and patient I suggested that hospice/ palliative care would be appropriate and not bronchoscopy / XRT Son and two daughters to meet with Dr Titus Mould tomorrow to discuss  Jenkins Rouge

## 2015-12-15 NOTE — Progress Notes (Signed)
Physical Therapy Treatment Patient Details Name: [name] MRN: [id number] DOB: January 30, 1930 Today's Date: 12/15/2015    History of Present Illness Pt admitted with acute respiratory failure with hypoxia due to COPD exacerbation, new onset afib. PMH: CAD, COPD, AVR, CABG.    PT Comments    Pt performed increased gait distance.  Pt would benefit from rollator next visit.  Pt remains to require cues for energy conservation.  SPO2 93% on RA.    Follow Up Recommendations  Home health PT;Supervision for mobility/OOB     Equipment Recommendations  Other (comment) (possible rollator.  )    Recommendations for Other Services       Precautions / Restrictions Precautions Precautions: Fall Restrictions Weight Bearing Restrictions: No    Mobility  Bed Mobility Overal bed mobility: Modified Independent             General bed mobility comments: HOB slightly elevated and minimal use of bed rails  Transfers Overall transfer level: Needs assistance Equipment used: Straight cane Transfers: Sit to/from Stand Sit to Stand: Min assist         General transfer comment: for assist to steady balance with use of SPC. x2 from recliner and to and from [facility].  Pt had BM post gait training.    Ambulation/Gait Ambulation/Gait assistance: Min assist Ambulation Distance (Feet): 60 Feet Assistive device: Straight cane Gait Pattern/deviations: Step-through pattern;Decreased stride length;Staggering right;Staggering left;Trunk flexed Gait velocity: decr   General Gait Details: remains to require assist for balance.     Stairs            Wheelchair Mobility    Modified Rankin (Stroke Patients Only)       Balance Overall balance assessment: Needs assistance Sitting-balance support: No upper extremity supported;Feet supported Sitting balance-Leahy Scale: Good     Standing balance support: Bilateral upper extremity supported;During functional activity Standing  balance-Leahy Scale: Fair                      Cognition Arousal/Alertness: Awake/alert Behavior During Therapy: WFL for tasks assessed/performed Overall Cognitive Status: History of cognitive impairments - at baseline       Memory: Decreased short-term memory              Exercises      General Comments        Pertinent Vitals/Pain Pain Assessment: No/denies pain    Home Living                      Prior Function            PT Goals (current goals can now be found in the care plan section) Acute Rehab PT Goals Patient Stated Goal: none stated Potential to Achieve Goals: Good Progress towards PT goals: Progressing toward goals    Frequency  Min 3X/week    PT Plan      Co-evaluation             End of Session Equipment Utilized During Treatment: Gait belt Activity Tolerance: Patient limited by fatigue;Treatment limited secondary to medical complications (Comment) Patient left: with call bell/phone within reach;in bed;with bed alarm set;with family/visitor present     Time: 8469-6295 PT Time Calculation (min) (ACUTE ONLY): 24 min  Charges:  $Gait Training: 8-22 mins $Therapeutic Activity: 8-22 mins                    G Codes:      [name]  [name] 12/15/2015, 4:50 PM  [name], PTA pager [phone]

## 2015-12-15 NOTE — Progress Notes (Signed)
Patient ID: [name], male   DOB: August 11, 1930, 82 y.o.   MRN: [id number]     Subjective:    Just had breathing Rx  Less dyspnea   Objective:   Temp:  [97.6 F (36.4 C)-98.3 F (36.8 C)] 97.6 F (36.4 C) (03/06 0446) Pulse Rate:  [65-94] 65 (03/06 0446) Resp:  [18] 18 (03/06 0446) BP: (126-157)/(63-78) 157/78 mmHg (03/06 0446) SpO2:  [94 %-98 %] 94 % (03/06 0446) Weight:  [62.869 kg (138 lb 9.6 oz)] 62.869 kg (138 lb 9.6 oz) (03/06 0440) Last BM Date: 12/14/15  Filed Weights   12/13/15 0718 12/14/15 0552 12/15/15 0440  Weight: 61.961 kg (136 lb 9.6 oz) 62.596 kg (138 lb) 62.869 kg (138 lb 9.6 oz)    Intake/Output Summary (Last 24 hours) at 12/15/15 0805 Last data filed at 12/15/15 0447  Gross per 24 hour  Intake      0 ml  Output    250 ml  Net   -250 ml    Telemetry: NSR 12/15/2015   Exam:  General:  Thin white cachectic male   HEENT: sclera clear, throat clear  Resp: clear anteriorally  Cardiac: RRR, mechanical S2, no m/r/g, no jvd  GI: abdomen soft, NT, ND  MSK: no LE edema  Neuro: no focal deficits  Psych: appropriate affect  Lab Results:  Basic Metabolic Panel:  Recent Labs Lab 12/12/15 0637 12/13/15 0614 12/15/15 0543  NA 137 136 137  K 4.1 4.0 4.4  CL 104 102 100*  CO2 '24 27 29  '$ GLUCOSE 144* 129* 141*  BUN 15 23* 32*  CREATININE 1.11 1.11 1.10  CALCIUM 9.0 9.1 9.1    Liver Function Tests:  Recent Labs Lab 12/11/15 1333 12/13/15 0614  AST 17 28  ALT 12* 17  ALKPHOS 126 115  BILITOT 0.8 0.7  PROT 6.9 6.8  ALBUMIN 2.8* 2.8*    CBC:  Recent Labs Lab 12/12/15 0637 12/13/15 0614 12/15/15 0543  WBC 8.8 10.9* 8.8  HGB 10.8* 10.9* 11.0*  HCT 32.7* 33.6* 33.1*  MCV 88.4 88.0 89.0  PLT 172 204 159     Coagulation:  Recent Labs Lab 12/13/15 0614 12/14/15 0632 12/15/15 0543  INR 3.93* 3.28* 2.46*    ECG:   Medications:   Scheduled Medications: . amLODipine  5 mg Oral Daily  . aspirin EC  81 mg Oral  Daily  . dorzolamide  1 drop Both Eyes BID  . feeding supplement (ENSURE ENLIVE)  237 mL Oral BID BM  . ipratropium-albuterol  3 mL Nebulization TID  . latanoprost  1 drop Both Eyes QHS  . levofloxacin  500 mg Oral Daily  . methylPREDNISolone (SOLU-MEDROL) injection  60 mg Intravenous Q12H  . metoprolol  25 mg Oral BID  . pantoprazole  40 mg Oral Daily  . polyethylene glycol  17 g Oral Daily  . sodium chloride flush  3 mL Intravenous Q12H    Infusions:    PRN Medications: acetaminophen **OR** acetaminophen, HYDROcodone-acetaminophen, levalbuterol, ondansetron **OR** ondansetron (ZOFRAN) IV     Assessment/Plan    1. Aortic stenosis - history of mechanical AVR - limited echo 12/2015 with LVEF 60-65%. Echo 03/2014 LVEF 55-60%, normal functioning AVR.   2. CAD - history of prior CABG - trop neg x, 1, EKG with LVH and chronic strain pattern - no chest pain, no further workup at this time  3. Lung mass - large 4.3 cm right sided mass suspicious for malignancy. Workup per primary team, if  need to hold coumadin for invasive testing would bridge with heparin gtt. k - would ask pulmonary to see today.  PET scan usually has to be done as outpatient   4. Afib - transient episode this admission. He is back in NSR currerntly on lopressor '25mg'$  bid He is already anticoagulated for his mechanical AVR, continue.   5. COPD exacerbation - management per primary team. Quit smoking after AVR    6. HTN -  Norvasc 5 mg started   Spoke with wife at length regarding diagnosis of lung cancer and relationship to his weakness and weight loss    Jenkins Rouge

## 2015-12-15 NOTE — Telephone Encounter (Signed)
No Charge 

## 2015-12-16 ENCOUNTER — Inpatient Hospital Stay (HOSPITAL_COMMUNITY): Payer: Medicare Other

## 2015-12-16 DIAGNOSIS — R918 Other nonspecific abnormal finding of lung field: Secondary | ICD-10-CM | POA: Insufficient documentation

## 2015-12-16 LAB — HEPARIN LEVEL (UNFRACTIONATED)
HEPARIN UNFRACTIONATED: 0.24 [IU]/mL — AB (ref 0.30–0.70)
HEPARIN UNFRACTIONATED: 0.55 [IU]/mL (ref 0.30–0.70)
HEPARIN UNFRACTIONATED: 0.54 [IU]/mL (ref 0.30–0.70)

## 2015-12-16 LAB — CBC
HCT: 33.1 % — ABNORMAL LOW (ref 39.0–52.0)
HEMOGLOBIN: 10.7 g/dL — AB (ref 13.0–17.0)
MCH: 28.3 pg (ref 26.0–34.0)
MCHC: 32.3 g/dL (ref 30.0–36.0)
MCV: 87.6 fL (ref 78.0–100.0)
PLATELETS: 180 10*3/uL (ref 150–400)
RBC: 3.78 MIL/uL — AB (ref 4.22–5.81)
RDW: 13 % (ref 11.5–15.5)
WBC: 11.1 10*3/uL — AB (ref 4.0–10.5)

## 2015-12-16 LAB — PROTIME-INR
INR: 2.09 — AB (ref 0.00–1.49)
PROTHROMBIN TIME: 23.3 s — AB (ref 11.6–15.2)

## 2015-12-16 MED ORDER — DEXAMETHASONE SODIUM PHOSPHATE 4 MG/ML IJ SOLN
6.0000 mg | Freq: Two times a day (BID) | INTRAMUSCULAR | Status: DC
  Administered 2015-12-16 – 2015-12-17 (×3): 6 mg via INTRAVENOUS
  Filled 2015-12-16 (×3): qty 2

## 2015-12-16 NOTE — Progress Notes (Signed)
Patient ID: [name], male   DOB: 07-06-1930, 82 y.o.   MRN: [id number]     Subjective:    Baseline dyspnea   Conference this afternoon with pulmonary Suspect hospice/palliative care best option Given age , weight loss and poor functional status   Objective:   Temp:  [97.4 F (36.3 C)-98.6 F (37 C)] 97.4 F (36.3 C) (03/07 0601) Pulse Rate:  [66-88] 76 (03/07 0601) Resp:  [18-20] 18 (03/07 0601) BP: (135-155)/(68-84) 155/84 mmHg (03/07 0601) SpO2:  [93 %-100 %] 93 % (03/06 2123) Weight:  [63.05 kg (139 lb)] 63.05 kg (139 lb) (03/07 0634) Last BM Date: 12/15/15  Filed Weights   12/14/15 0552 12/15/15 0440 12/16/15 5027  Weight: 62.596 kg (138 lb) 62.869 kg (138 lb 9.6 oz) 63.05 kg (139 lb)    Intake/Output Summary (Last 24 hours) at 12/16/15 0830 Last data filed at 12/16/15 0603  Gross per 24 hour  Intake 649.53 ml  Output   1900 ml  Net -1250.47 ml    Telemetry: NSR 12/16/2015   Exam:  General:  Thin white cachectic male   HEENT: sclera clear, throat clear  Resp: clear anteriorally  Cardiac: RRR, mechanical S2, no m/r/g, no jvd  GI: abdomen soft, NT, ND  MSK: no LE edema  Neuro: no focal deficits  Psych: appropriate affect  Lab Results:  Basic Metabolic Panel:  Recent Labs Lab 12/12/15 0637 12/13/15 0614 12/15/15 0543  NA 137 136 137  K 4.1 4.0 4.4  CL 104 102 100*  CO2 '24 27 29  '$ GLUCOSE 144* 129* 141*  BUN 15 23* 32*  CREATININE 1.11 1.11 1.10  CALCIUM 9.0 9.1 9.1    Liver Function Tests:  Recent Labs Lab 12/11/15 1333 12/13/15 0614  AST 17 28  ALT 12* 17  ALKPHOS 126 115  BILITOT 0.8 0.7  PROT 6.9 6.8  ALBUMIN 2.8* 2.8*    CBC:  Recent Labs Lab 12/13/15 0614 12/15/15 0543 12/16/15 0224  WBC 10.9* 8.8 11.1*  HGB 10.9* 11.0* 10.7*  HCT 33.6* 33.1* 33.1*  MCV 88.0 89.0 87.6  PLT 204 159 180     Coagulation:  Recent Labs Lab 12/14/15 0632 12/15/15 0543 12/16/15 0224  INR 3.28* 2.46* 2.09*     ECG:   Medications:   Scheduled Medications: . amLODipine  5 mg Oral Daily  . aspirin EC  81 mg Oral Daily  . dorzolamide  1 drop Both Eyes BID  . feeding supplement (ENSURE ENLIVE)  237 mL Oral BID BM  . ipratropium-albuterol  3 mL Nebulization TID  . latanoprost  1 drop Both Eyes QHS  . levofloxacin  500 mg Oral Daily  . methylPREDNISolone (SOLU-MEDROL) injection  60 mg Intravenous Q12H  . metoprolol  25 mg Oral BID  . pantoprazole  40 mg Oral Daily  . polyethylene glycol  17 g Oral Daily  . sodium chloride flush  3 mL Intravenous Q12H    Infusions: . heparin 1,100 Units/hr (12/16/15 0341)    PRN Medications: acetaminophen **OR** acetaminophen, HYDROcodone-acetaminophen, levalbuterol, ondansetron **OR** ondansetron (ZOFRAN) IV     Assessment/Plan    1. Aortic stenosis - history of mechanical AVR - limited echo 12/2015 with LVEF 60-65%. Echo 03/2014 LVEF 55-60%, normal functioning AVR.   2. CAD - history of prior CABG - trop neg x, 1, EKG with LVH and chronic strain pattern - no chest pain, no further workup at this time  3. Lung mass - large 4.3 cm right sided mass  suspicious for malignancy. Workup per primary team, if need to hold coumadin for invasive testing would bridge with heparin gtt. k - would ask pulmonary to see today.  PET scan usually has to be done as outpatient   4. Afib - transient episode this admission. He is back in NSR currerntly on lopressor '25mg'$  bid He is already anticoagulated for his mechanical AVR, continue.   5. COPD exacerbation - management per primary team. Quit smoking after AVR    6. HTN -  Norvasc 5 mg started   Spoke with wife at length regarding diagnosis of lung cancer and relationship to his weakness and weight loss  Recommended Hospice and palliative care rather than XRT   Jenkins Rouge

## 2015-12-16 NOTE — Progress Notes (Signed)
Utilization review completed. Day Greb, RN, BSN. 

## 2015-12-16 NOTE — Progress Notes (Signed)
ANTICOAGULATION CONSULT NOTE - Follow Up Consult  Pharmacy Consult for Heparin Indication: atrial fibrillation and mechanical AVR  Allergies  Allergen Reactions  . Penicillins Anaphylaxis and Rash    Patient Measurements: Weight: 139 lb (63.05 kg) Heparin Dosing Weight: 63 kg  Vital Signs: Temp: 97.6 F (36.4 C) (03/07 1420) BP: 135/72 mmHg (03/07 2110) Pulse Rate: 88 (03/07 2110)  Labs:  Recent Labs  12/14/15 0093 12/15/15 0543  12/16/15 0224 12/16/15 1237 12/16/15 2102  HGB  --  11.0*  --  10.7*  --   --   HCT  --  33.1*  --  33.1*  --   --   PLT  --  159  --  180  --   --   LABPROT 32.7* 26.4*  --  23.3*  --   --   INR 3.28* 2.46*  --  2.09*  --   --   HEPARINUNFRC  --   --   < > 0.24* 0.55 0.54  CREATININE  --  1.10  --   --   --   --   < > = values in this interval not displayed.  Estimated Creatinine Clearance: 45.4 mL/min (by C-G formula based on Cr of 1.1).   Medications:  Heparin @ 1100 units/hr (11 ml/hr)  Assessment: 58 YOM with lung cancer on warfarin PTA for mechanical AVR and now with transient Afib. Warfarin is on hold for possible bronchoscopy.  Heparin level this evening remains therapeutic (HL 0.54 << 0.55, goal of 0.3-0.7). CBC stable from this morning - no bleeding noted at this time.   Goal of Therapy:  Heparin level 0.3-0.7 units/ml Monitor platelets by anticoagulation protocol: Yes   Plan:  1. Continue heparin at the current rate of 1100 units/hr (11 ml/hr) 2. Will continue to monitor for any signs/symptoms of bleeding and will follow up with heparin level in the a.m.   Alycia Rossetti, PharmD, BCPS Clinical Pharmacist Pager: [phone] 12/16/2015 9:41 PM

## 2015-12-16 NOTE — Progress Notes (Signed)
[facility] for heparin Indication: atrial fibrillation and mechanical AVR  Allergies  Allergen Reactions  . Penicillins Anaphylaxis and Rash    Patient Measurements: Weight: 139 lb (63.05 kg) Heparin Dosing Weight: 62.9 kg  Vital Signs: Temp: 97.4 F (36.3 C) (03/07 0601) BP: 127/67 mmHg (03/07 1007) Pulse Rate: 80 (03/07 1007)  Labs:  Recent Labs  12/14/15 1991 12/15/15 0543 12/15/15 1630 12/16/15 0224 12/16/15 1237  HGB  --  11.0*  --  10.7*  --   HCT  --  33.1*  --  33.1*  --   PLT  --  159  --  180  --   LABPROT 32.7* 26.4*  --  23.3*  --   INR 3.28* 2.46*  --  2.09*  --   HEPARINUNFRC  --   --  0.10* 0.24* 0.55  CREATININE  --  1.10  --   --   --     Estimated Creatinine Clearance: 45.4 mL/min (by C-G formula based on Cr of 1.1).  Assessment: 82 y/o male with mechanical AVR and Afib, Coumadin on hold, for heparin. Heparin level is therapeutic at 0.55, will continue with current rate and check another level to confirm.  Goal of Therapy:  Heparin level 0.3-0.7 units/ml Monitor platelets by anticoagulation protocol: Yes   Plan:  Continue heparin at 1100 units/hr Check confirmatory heparin level in 8 hours. Monitor daily heparin level, CBC, s/sx of bleeding  Thank you for allowing [place] to participate in this patients care. Jens Som, PharmD Pager: [phone]  12/16/2015 2:18 PM

## 2015-12-16 NOTE — Progress Notes (Signed)
Physical Therapy Treatment Patient Details Name: [name] MRN: [id number] DOB: Nov 30, 1929 Today's Date: 12/16/2015    History of Present Illness Pt admitted with acute respiratory failure with hypoxia due to COPD exacerbation, new onset afib. PMH: CAD, COPD, AVR, CABG.    PT Comments    Pt performed increased gait distance which proved to decreased O2 saturations to 86%.  Pt required 3L to improve O2 saturations greater than 90%.  Pt had 1 LOB requiring mod assist to correct and PTA replaced SPC use with RW.  Pt demonstrated improved balance with RW use.    Follow Up Recommendations  Home health PT;Supervision for mobility/OOB     Equipment Recommendations  Rolling walker with 5" wheels    Recommendations for Other Services       Precautions / Restrictions Precautions Precautions: Fall Restrictions Weight Bearing Restrictions: No    Mobility  Bed Mobility Overal bed mobility: Modified Independent             General bed mobility comments: HOB slightly elevated and minimal use of bed rails  Transfers Overall transfer level: Needs assistance Equipment used: Straight cane;Rolling walker (2 wheeled) (Pt performed sit to stand from bed with straight cane requiring min assist to steady and maintain balance.  Pt performed sit to stand from [facility] with Rw and remains to require min assist but improved balance in standing with use of RW.  ) Transfers: Sit to/from Stand Sit to Stand: Min assist         General transfer comment: Pt performed sit to stand from bed with SPC.  required use of RW after LOB with cane during stand to sit to [facility].  Pt demonstrated improve balance with RW.    Ambulation/Gait Ambulation/Gait assistance: Min assist;Mod assist Ambulation Distance (Feet): 160 Feet Assistive device: Rolling walker (2 wheeled);Straight cane   Gait velocity: decr   General Gait Details: remains to require assist for balance.  Pt demonstrated LOB requiring mod assist  to correct when backing with SPC,  Pt performed last 15 ft of trial with RW.  Improved steadiness noted.     Stairs            Wheelchair Mobility    Modified Rankin (Stroke Patients Only)       Balance Overall balance assessment: Needs assistance   Sitting balance-Leahy Scale: Good       Standing balance-Leahy Scale: Poor                      Cognition Arousal/Alertness: Awake/alert Behavior During Therapy: WFL for tasks assessed/performed Overall Cognitive Status: History of cognitive impairments - at baseline       Memory: Decreased short-term memory              Exercises      General Comments        Pertinent Vitals/Pain Pain Assessment: No/denies pain    Home Living                      Prior Function            PT Goals (current goals can now be found in the care plan section) Acute Rehab PT Goals Patient Stated Goal: none stated Potential to Achieve Goals: Good Progress towards PT goals: Progressing toward goals    Frequency  Min 3X/week    PT Plan      Co-evaluation  End of Session Equipment Utilized During Treatment: Gait belt;Oxygen Activity Tolerance: Patient limited by fatigue;Treatment limited secondary to medical complications (Comment) Patient left: with call bell/phone within reach;in bed;with bed alarm set;with family/visitor present     Time: 1205-1237 PT Time Calculation (min) (ACUTE ONLY): 32 min  Charges:  $Gait Training: 8-22 mins $Therapeutic Activity: 8-22 mins                    G Codes:      Cristela Blue 12-17-15, 1:02 PM  [name], PTA pager [phone]

## 2015-12-16 NOTE — Progress Notes (Signed)
[facility] PROGRESS NOTE  [name] DDU:[id number] DOB: 13-Nov-1929 DOA: 12/11/2015 PCP: [name], MD  Length of stay: 5   Assessment/Plan: Active Problems:   S/P AVR   COPD exacerbation ([place])   Acute respiratory failure with hypoxia (HCC)   Weakness   Left lower quadrant pain   Weakness generalized   Weight loss   Lung mass   PAF (paroxysmal atrial fibrillation) (HCC)   Protein-calorie malnutrition, severe    Brief summary 82 y.o. male with a hx of CAD, aortic stenosis, s/p mechanical [employer] AVR + 1v CABG (S-RCA) in 2001, ILD, coumadin rx. Myoview (12/06): Normal, EF 70%. Echo (02/2012): EF 60-65%, mechanical AVR ok with mean gradient 11 mmHg. Last seen by me in 03/2013. He continues to note chronic DOE. This is unchanged. Previously with NYHA Class 2b symptoms. No orthopnea, PND, edema. Presents to the ER with shortness of breath and weakness, patient noted getting weaker and having more shortness of breath on Tuesday, also has a nonproductive cough. Chest x-ray shows pulmonary fibrosis and chronic interstitial disease. EKG shows new onset atrial fibrillation. Patient also found to have a new lung mass this admission, pulmonary following.    Assessment and plan Acute respiratory failure with hypoxia likely secondary to COPD exacerbation. CXR without any acute findings or suggestion of heart failure. CT chest consistent with infrahilar central right lower lobe mass Continue telemetry -Continue 02 via  [place] to maintain sats 88-92%. Currently requiring 4 liters. Patient was not on oxygen at home. ABG shows pH of 7.37, PCO2 36.7, PO2 of 76.0 -Scheduled Duonebs Q4 hours with Albuterol q2 prn continue  Levaquin for COPD exacerbation ,   Continue Solumedrol '60mg'$  IV BID, change to Decadron by pulmonary -H2 blocker or continue home PPI CT of the chest to rule out interstitial lung disease shows a new lung mass,   Atrial fibrillation, rate  controlled.Chadvasc 4.  This seems to be new onset A-fib or paroxysmal at best  Cardiology consulted , 12/2015 with LVEF 60-65%. Echo 03/2014 LVEF 55-60%, normal functioning AVR -on coumadin for mechanical valve, goal 2.5-3.5, now on a heparin bridge  tHe is back in NSR currerntly on lopressor '25mg'$  bid. He is already anticoagulated for his mechanical AVR, continue.   Lung mass - large 4.3 cm right sided mass suspicious for malignancy. Workup per primary team, if need to hold coumadin for invasive testing would bridge with heparin gtt.  Pulmonary consult  requested , bronchoscopy once INR is below 2.0   MRI of the brain negative for metastasis Family to make a decision about pursuing further workup including tissue diagnosis vs palliative radiation vs   comfort care measures Pulmonary to have another meeting with the son on 3/7   Aortic valve replacement - mechanical [employer] Judes. On chronic coumadin. INR supratherapeutic . Pharmacy consulted for coumadin management , bridge with heparin when INR is less than 2.5 , goal INR 2.5-3.5 If no procedures planned restart Coumadin today  Weakness / Protein calorie malnutrition with weight loss related to chronic illness, malnutrition -PT evaluation recommended home health -Dietician consult -continue Ensure for now   Left lower quadrant pain. Really more of a discomfort over the last 2 weeks. Son gives history of recent constipation which sounds unresolved despite a laxative at home.  continue MiraLAX as tolerated.  Could consider CT abdomen pelvis for  Complete staging   DVT prophylaxsis Coumadin/heparin  Code Status:      Code Status  Orders     Partial code    Start     Ordered     Family Communication: Discussed in detail with the patient, all imaging results, lab results explained to the patient  Discussed with daughter Helene Kelp [phone], [place], [place] [phone] Disposition Plan: pulmonary meeting  with family to make a final decision on 3/7, then home with home health     Consultants:  Cardiology  Pulmonary  Palliative care  Procedures:  None  Antibiotics: Anti-infectives    Start     Dose/Rate Route Frequency Ordered Stop   12/14/15 1115  levofloxacin (LEVAQUIN) tablet 500 mg     500 mg Oral Daily 12/14/15 1102     12/12/15 0830  levofloxacin (LEVAQUIN) IVPB 500 mg  Status:  Discontinued     500 mg 100 mL/hr over 60 Minutes Intravenous Every 24 hours 12/12/15 0734 12/14/15 1102   12/11/15 2000  doxycycline (VIBRAMYCIN) 100 mg in dextrose 5 % 250 mL IVPB  Status:  Discontinued     100 mg 125 mL/hr over 120 Minutes Intravenous Every 12 hours 12/11/15 1741 12/12/15 0734         HPI/Subjective  Patient's wife is by the bedside, waiting for pulmonary meeting this afternoon, chest pain and shortness of breath is improved  Objective: Filed Vitals:   12/15/15 2123 12/16/15 0601 12/16/15 0634 12/16/15 1007  BP: 142/68 155/84  127/67  Pulse: 88 76  80  Temp: 98.6 F (37 C) 97.4 F (36.3 C)    TempSrc: Oral     Resp: 18 18    Weight:   63.05 kg (139 lb)   SpO2: 93%       Intake/Output Summary (Last 24 hours) at 12/16/15 1118 Last data filed at 12/16/15 0603  Gross per 24 hour  Intake 409.53 ml  Output   1900 ml  Net -1490.47 ml    Exam:  General: No acute respiratory distress, cachectic  Lungs: Clear to auscultation bilaterally without wheezes or crackles Cardiovascular: Irregularly irregular, without murmur gallop or rub normal S1 and S2 Abdomen: Nontender, nondistended, soft, bowel sounds positive, no rebound, no ascites, no appreciable mass Extremities: No significant cyanosis, clubbing, or edema bilateral lower extremities     Data Review   Micro Results No results found for this or any previous visit (from the past 240 hour(s)).  Radiology Reports Dg Chest 2 View  12/11/2015  CLINICAL DATA:  Shortness of breath, weakness and cough for a  while, some blood in stool, smoker, COPD, emphysema, coronary disease post bypass, hypertension EXAM: CHEST  2 VIEW COMPARISON:  12/09/2015 FINDINGS: Normal size of cardiac silhouette post AVR. Atherosclerotic calcification aorta. Question enlarged central pulmonary arteries which could reflect pulmonary arterial hypertension. Emphysematous and bronchitic changes consistent with COPD. Chronic interstitial lung disease changes in the mid to lower lungs bilaterally similar to previous exam likely representing fibrosis. No definite superimposed acute infiltrate, pleural effusion or pneumothorax. Bones diffusely demineralized. IMPRESSION: Post AVR. COPD changes with chronic interstitial disease/pulmonary fibrosis. No acute abnormalities. Electronically Signed   By: Lavonia Dana M.D.   On: 12/11/2015 13:55   Dg Chest 2 View  12/09/2015  CLINICAL DATA:  Weight loss, severe emphysema and pulmonary fibrosis. Status post aortic valve replacement. EXAM: CHEST  2 VIEW COMPARISON:  60 in 2015, 03/21/2014 FINDINGS: Chronic background severe emphysema pattern and mid and lower lung interstitial fibrosis. Interval resolution of the previous right upper lobe spiculated airspace  opacity, compatible with infectious/inflammatory process. No current superimposed edema or pneumonia. No effusion or pneumothorax. Trachea midline. Prior coronary bypass also evident as well as aortic valve replacement. Atherosclerosis of the aorta. Chronic appearing mid thoracic mild compression fracture. IMPRESSION: Chronic background severe emphysema and mid and lower lung interstitial fibrosis. Interval resolution of the right upper lobe focal spiculated airspace process. Suspect infectious/inflammatory process. No current acute process. Electronically Signed   By: Jerilynn Mages.  Shick M.D.   On: 12/09/2015 15:13   Ct Chest Wo Contrast  12/12/2015  CLINICAL DATA:  Shortness breath, nonproductive cough. History of pulmonary fibrosis. EXAM: CT CHEST WITHOUT  CONTRAST TECHNIQUE: Multidetector CT imaging of the chest was performed following the standard protocol without IV contrast. COMPARISON:  Chest radiographs dated 12/11/2015. CT chest dated 03/21/2014. FINDINGS: Mediastinum/Nodes: The heart is normal in size. No pericardial effusion. Fluid in the superior pericardial recess. Coronary atherosclerosis. Postsurgical changes related to prior CABG. Prosthetic aortic valve. Atherosclerotic calcifications of the aortic arch. 4.6 cm ascending thoracic aortic aneurysm (series 2/image 38). 12 mm short axis right paratracheal node (series 2/ image 26), previously 10 mm. Lungs/Pleura: 4.0 x 2.9 x 4.3 cm infrahilar/ central right lower lobe mass narrowing the right middle lobe and right lower lobe bronchus, suspicious for primary bronchogenic neoplasm. 16 x 9 mm subpleural nodule in the medial right lower lobe (series 3/ image 38), new. 9 mm nodule in the superior segment left lower lobe (series 3/image 36), new. Additional mild patchy/nodular opacity in the superior segment right lower lobe (series 3/image 31), favored to reflect postobstructive opacity. Lower lobe predominant bronchiectasis with bronchial wall thickening. Associated subpleural reticulation/ fibrosis in the bilateral lower lobes. Trace right pleural effusion. Underlying moderate centrilobular and paraseptal emphysematous changes. No pneumothorax. Upper abdomen: Hyperdense left adrenal nodule (series 2/image 60), incompletely visualized, chronic. Right upper pole renal cyst (series 2/image 60), incompletely visualized, chronic. Musculoskeletal: Degenerative changes of the visualized thoracolumbar spine. Mild wedging of three mid thoracic vertebral bodies (series 5/image 59), T6-T8, mildly progressed since 2015. IMPRESSION: 4.3 cm right infrahilar/central right lower lobe mass, suspicious for primary bronchogenic neoplasm. Associated bilateral lower lobe pulmonary nodules, suspicious for metastases. Consider  bronchoscopy for tissue confirmation. PET-CT may be useful for staging. Electronically Signed   By: Julian Hy M.D.   On: 12/12/2015 16:01   Mr Jeri Cos GH Contrast  12/14/2015  CLINICAL DATA:  Acute presentation with generalized weakness. Recent diagnosis of lung mass. EXAM: MRI HEAD WITHOUT AND WITH CONTRAST TECHNIQUE: Multiplanar, multiecho pulse sequences of the brain and surrounding structures were obtained without and with intravenous contrast. CONTRAST:  39m MULTIHANCE GADOBENATE DIMEGLUMINE 529 MG/ML IV SOLN COMPARISON:  None. FINDINGS: Diffusion imaging does not show any acute or subacute infarction. There are chronic small-vessel ischemic changes of the pons. No cerebellar insult. Cerebral hemispheres show generalized atrophy with mild chronic small-vessel disease of the deep white matter. No cortical or large vessel territory infarction. No evidence of primary or metastatic mass lesion, hemorrhage, hydrocephalus or extra-axial collection. There is considerable motion degradation on some of the postcontrast imaging. No pituitary mass. There is an opacified posterior ethmoid air cell on the right. Major vessels at the base of the brain show flow. No skull or skullbase lesion is seen. IMPRESSION: No evidence of acute infarction. No evidence of metastatic disease. Atrophy and chronic small vessel ischemic change, less than often seen in healthy individuals of this age. Electronically Signed   By: MNelson ChimesM.D.   On: 12/14/2015 14:44  CBC  Recent Labs Lab 12/11/15 1333 12/12/15 0637 12/13/15 0614 12/15/15 0543 12/16/15 0224  WBC 7.3 8.8 10.9* 8.8 11.1*  HGB 11.0* 10.8* 10.9* 11.0* 10.7*  HCT 34.4* 32.7* 33.6* 33.1* 33.1*  PLT 179 172 204 159 180  MCV 88.4 88.4 88.0 89.0 87.6  MCH 28.3 29.2 28.5 29.6 28.3  MCHC 32.0 33.0 32.4 33.2 32.3  RDW 13.3 13.5 13.5 13.4 13.0    Chemistries   Recent Labs Lab 12/11/15 1333 12/12/15 0637 12/13/15 0614 12/15/15 0543  NA 139 137  136 137  K 4.1 4.1 4.0 4.4  CL 105 104 102 100*  CO2 '25 24 27 29  '$ GLUCOSE 135* 144* 129* 141*  BUN 14 15 23* 32*  CREATININE 1.15 1.11 1.11 1.10  CALCIUM 8.9 9.0 9.1 9.1  AST 17  --  28  --   ALT 12*  --  17  --   ALKPHOS 126  --  115  --   BILITOT 0.8  --  0.7  --    ------------------------------------------------------------------------------------------------------------------ estimated creatinine clearance is 45.4 mL/min (by C-G formula based on Cr of 1.1). ------------------------------------------------------------------------------------------------------------------ No results for input(s): HGBA1C in the last 72 hours. ------------------------------------------------------------------------------------------------------------------ No results for input(s): CHOL, HDL, LDLCALC, TRIG, CHOLHDL, LDLDIRECT in the last 72 hours. ------------------------------------------------------------------------------------------------------------------ No results for input(s): TSH, T4TOTAL, T3FREE, THYROIDAB in the last 72 hours.  Invalid input(s): FREET3 ------------------------------------------------------------------------------------------------------------------ No results for input(s): VITAMINB12, FOLATE, FERRITIN, TIBC, IRON, RETICCTPCT in the last 72 hours.  Coagulation profile  Recent Labs Lab 12/12/15 0637 12/13/15 0614 01-05-2016 0632 12/15/15 0543 12/16/15 0224  INR 3.98* 3.93* 3.28* 2.46* 2.09*    No results for input(s): DDIMER in the last 72 hours.  Cardiac Enzymes No results for input(s): CKMB, TROPONINI, MYOGLOBIN in the last 168 hours.  Invalid input(s): CK ------------------------------------------------------------------------------------------------------------------ Invalid input(s): POCBNP   CBG:  Recent Labs Lab 12/13/15 0814 12/13/15 1229 12/13/15 1701  GLUCAP 123* 131* 166*       Studies: Mr Kizzie Fantasia Contrast  01-05-2016  CLINICAL DATA:   Acute presentation with generalized weakness. Recent diagnosis of lung mass. EXAM: MRI HEAD WITHOUT AND WITH CONTRAST TECHNIQUE: Multiplanar, multiecho pulse sequences of the brain and surrounding structures were obtained without and with intravenous contrast. CONTRAST:  44m MULTIHANCE GADOBENATE DIMEGLUMINE 529 MG/ML IV SOLN COMPARISON:  None. FINDINGS: Diffusion imaging does not show any acute or subacute infarction. There are chronic small-vessel ischemic changes of the pons. No cerebellar insult. Cerebral hemispheres show generalized atrophy with mild chronic small-vessel disease of the deep white matter. No cortical or large vessel territory infarction. No evidence of primary or metastatic mass lesion, hemorrhage, hydrocephalus or extra-axial collection. There is considerable motion degradation on some of the postcontrast imaging. No pituitary mass. There is an opacified posterior ethmoid air cell on the right. Major vessels at the base of the brain show flow. No skull or skullbase lesion is seen. IMPRESSION: No evidence of acute infarction. No evidence of metastatic disease. Atrophy and chronic small vessel ischemic change, less than often seen in healthy individuals of this age. Electronically Signed   By: MNelson ChimesM.D.   On: 003-27-1714:44      No results found for: HGBA1C Lab Results  Component Value Date   CREATININE 1.10 12/15/2015       Scheduled Meds: . amLODipine  5 mg Oral Daily  . aspirin EC  81 mg Oral Daily  . dexamethasone  6 mg Intravenous Q12H  . dorzolamide  1  drop Both Eyes BID  . feeding supplement (ENSURE ENLIVE)  237 mL Oral BID BM  . ipratropium-albuterol  3 mL Nebulization TID  . latanoprost  1 drop Both Eyes QHS  . levofloxacin  500 mg Oral Daily  . metoprolol  25 mg Oral BID  . pantoprazole  40 mg Oral Daily  . polyethylene glycol  17 g Oral Daily  . sodium chloride flush  3 mL Intravenous Q12H   Continuous Infusions: . heparin 1,100 Units/hr (12/16/15  1008)    Active Problems:   S/P AVR   COPD exacerbation (HCC)   Acute respiratory failure with hypoxia (HCC)   Weakness   Left lower quadrant pain   Weakness generalized   Weight loss   Lung mass   PAF (paroxysmal atrial fibrillation) (HCC)   Protein-calorie malnutrition, severe    Time spent: 45 minutes   [facility] Pager [phone]. If 7PM-7AM, please contact night-coverage at [email or web address], password [facility] 12/16/2015, 11:18 AM  LOS: 5 days

## 2015-12-16 NOTE — Progress Notes (Signed)
Occupational Therapy Treatment Patient Details Name: [name] MRN: [id number] DOB: 01/11/1930 Today's Date: 12/16/2015    History of present illness Pt admitted with acute respiratory failure with hypoxia due to COPD exacerbation, new onset afib. PMH: CAD, COPD, AVR, CABG.   OT comments  Continue to recommend [place]. Education provided in session. Will continue to follow acutely.  Follow Up Recommendations  Home health OT;Supervision/Assistance - 24 hour    Equipment Recommendations  3 in 1 bedside comode    Recommendations for Other Services      Precautions / Restrictions Precautions Precautions: Fall Restrictions Weight Bearing Restrictions: No       Mobility Bed Mobility     General bed mobility comments: not assessed  Transfers Overall transfer level: Needs assistance  Transfers: Sit to/from Stand Sit to Stand/Stand to Sit: Min assist         General transfer comment: RW in front of pt for support. Assist for stand to sit to chair. Min guard for sit to stand.     Balance Assist for ambulation-pt used RW. Pt performed tasks standing at sink without LOB.              ADL Overall ADL's : Needs assistance/impaired     Grooming: Applying deodorant;Oral care;Wash/dry face;Min guard;Sitting;Standing   Upper Body Bathing: Set up;Supervision/ safety;Sitting         Upper Body Dressing Details (indicate cue type and reason): OT and spouse helped change pt's gown-pt participated some, but there were lines to manage.   Lower Body Dressing Details (indicate cue type and reason): wife doffed socks Toilet Transfer: Minimal assistance;Ambulation;RW;BSC           Functional mobility during ADLs: Minimal assistance;Rolling walker General ADL Comments: Educated on energy conservation and deep breathing technique.  Pt SOB in session.      Vision                     Perception     Praxis      Cognition  Awake/Alert Behavior During  Therapy: WFL for tasks assessed/performed Overall Cognitive Status: Difficult to assess (difficult to assess due to [facility])                    Extremity/Trunk Assessment               Exercises     Shoulder Instructions       General Comments      Pertinent Vitals/ Pain       Pain Assessment: No/denies pain  Home Living                                          Prior Functioning/Environment              Frequency Min 2X/week     Progress Toward Goals  OT Goals(current goals can now be found in the care plan section)  Progress towards OT goals: Progressing toward goals  Acute Rehab OT Goals Patient Stated Goal: not stated OT Goal Formulation: With patient/family Time For Goal Achievement: 12/26/15 Potential to Achieve Goals: Good ADL Goals Pt Will Perform Grooming: with supervision;standing (3 activities) Pt Will Perform Lower Body Bathing: with supervision;sit to/from stand Pt Will Perform Lower Body Dressing: with supervision;sit to/from stand Pt Will Transfer to Toilet: with supervision;ambulating;regular height toilet Pt Will Perform Toileting -  Clothing Manipulation and hygiene: with supervision;sit to/from stand Pt Will Perform Tub/Shower Transfer: Shower transfer;with supervision;shower seat;rolling walker Additional ADL Goal #1: Pt will utilize energy conservation and breathing strategies during ADL and mobility with min verbal cues.  Plan Discharge plan needs to be updated    Co-evaluation                 End of Session Equipment Utilized During Treatment: Oxygen;Gait belt;Rolling walker   Activity Tolerance Patient limited by fatigue   Patient Left in chair;with call bell/phone within reach;with chair alarm set;with family/visitor present   Nurse Communication Other (comment);Mobility status (catheter came off)        Time: [phone] (time spent going to get oxygen and nurse managing IV)  OT Time Calculation  (min): 23 min  Charges: OT General Charges $OT Visit: 1 Procedure OT Treatments $Self Care/Home Management : 8-22 mins  Benito Mccreedy OTR/L [phone] 12/16/2015, 3:13 PM

## 2015-12-16 NOTE — Progress Notes (Signed)
[place] for heparin Indication: atrial fibrillation and mechanical AVR  Allergies  Allergen Reactions  . Penicillins Anaphylaxis and Rash    Patient Measurements: Weight: 138 lb 9.6 oz (62.869 kg) Heparin Dosing Weight: 62.9 kg  Vital Signs: Temp: 98.6 F (37 C) (03/06 2123) Temp Source: Oral (03/06 2123) BP: 142/68 mmHg (03/06 2123) Pulse Rate: 88 (03/06 2123)  Labs:  Recent Labs  12/13/15 9471 12/14/15 2527 12/15/15 0543 12/15/15 1630 12/16/15 0224  HGB 10.9*  --  11.0*  --  10.7*  HCT 33.6*  --  33.1*  --  33.1*  PLT 204  --  159  --  180  LABPROT 37.5* 32.7* 26.4*  --  23.3*  INR 3.93* 3.28* 2.46*  --  2.09*  HEPARINUNFRC  --   --   --  0.10* 0.24*  CREATININE 1.11  --  1.10  --   --     Estimated Creatinine Clearance: 45.3 mL/min (by C-G formula based on Cr of 1.1).  Assessment: 82 y/o male with mechanical AVR and Afib, Coumadin on hold, for heparin   Goal of Therapy:  Heparin level 0.3-0.7 units/ml Monitor platelets by anticoagulation protocol: Yes   Plan:  Increase Heparin 1100 units/hr Check heparin level in 8 hours.  Phillis Knack, PharmD, BCPS  12/16/2015 3:30 AM

## 2015-12-16 NOTE — Progress Notes (Signed)
SATURATION QUALIFICATIONS: (This note is used to comply with regulatory documentation for home oxygen)  Patient Saturations on Room Air at Rest =96%  Patient Saturations on Room Air while Ambulating =86%  Patient Saturations on 3 Liters of oxygen while Ambulating = 96%  Please briefly explain why patient needs home oxygen: desaturation with activity  [name], PTA pager [phone]

## 2015-12-16 NOTE — Progress Notes (Addendum)
Family discussion at bedside.  Mr Zenk's Son, two daughters and wife were present.  We discussed current CT findings, the feeling that this is likely cancer and the potential treatments we felt were available. The family was very insightful and had a clear vision of what was important to Mr Mahany. They agreed that they did not want to seek out treatment for cancer nor put Mr Taboada thru any diagnostic interventions. At the end of our discussion the family unanimously agreed that they did not want to seek out treatment or further diagnostics of the lung mass. They desire to take him home and are open to and desire the support of hospice. I spoke to the home hospice coordinator today. They will meet w/ the family again this afternoon.   Plan Full DNR Will place palliative care consult to assist further w/ potential symptom management goals  Home with hospice  No further work up of his lung mass Family understands that they may eventually require more comfort based care.   Time: 315 to [place] ACNP-BC [place] Pager # [phone] OR # [phone] if no answer

## 2015-12-16 NOTE — Progress Notes (Signed)
Name: [name] MRN: [id number] DOB: 1930-01-22    ADMISSION DATE:  12/11/2015 CONSULTATION DATE:  3/6  REFERRING MD :  Allyson Sabal   CHIEF COMPLAINT:  Lung mass  BRIEF PATIENT DESCRIPTION:  This is a 82 year old male admitted on 3/2 w/ working dx of AECOPD. CT chest obtained to further identify extent of baseline lung disease incidentally  discovered a large infrahilar and RLL lung mass w/ associated airway obstruction. PCCM was asked to assist w/ evaluation.   SIGNIFICANT EVENTS    STUDIES:  CT chest 3/3: 4.3 cm right infrahilar/central right lower lobe mass, suspicious for primary bronchogenic neoplasm. Associated bilateral lower lobe pulmonary nodules, suspicious for metastases.   SUBJECTIVE:  No distress   VITAL SIGNS: Temp:  [97.4 F (36.3 C)-98.6 F (37 C)] 97.4 F (36.3 C) (03/07 0601) Pulse Rate:  [66-88] 76 (03/07 0601) Resp:  [18-20] 18 (03/07 0601) BP: (135-155)/(68-84) 155/84 mmHg (03/07 0601) SpO2:  [93 %-100 %] 93 % (03/06 2123) Weight:  [139 lb (63.05 kg)] 139 lb (63.05 kg) (03/07 0634)  PHYSICAL EXAMINATION: General:  Frail 82 year old male, not in distress. Comfortable seems a little less confused.  Neuro:  Awake, very hard of hearing. Has some memory deficits. No focal strength/motor def  HEENT:  Eyes are sunken in. MMM, no JVD + temporal wasting  Cardiovascular:  RRR w/ + mechanical click  Lungs:  Clear w/out accessory muscle use  Abdomen:  Soft, not tender + bowel sounds  Musculoskeletal:  Equal st and bulk  Skin:  Warm dry   Recent Labs Lab 12/12/15 0637 12/13/15 0614 12/15/15 0543  NA 137 136 137  K 4.1 4.0 4.4  CL 104 102 100*  CO2 '24 27 29  '$ BUN 15 23* 32*  CREATININE 1.11 1.11 1.10  GLUCOSE 144* 129* 141*    Recent Labs Lab 12/13/15 0614 12/15/15 0543 12/16/15 0224  HGB 10.9* 11.0* 10.7*  HCT 33.6* 33.1* 33.1*  WBC 10.9* 8.8 11.1*  PLT 204 159 180   Mr Brain W Wo Contrast  12/14/2015  CLINICAL DATA:  Acute presentation  with generalized weakness. Recent diagnosis of lung mass. EXAM: MRI HEAD WITHOUT AND WITH CONTRAST TECHNIQUE: Multiplanar, multiecho pulse sequences of the brain and surrounding structures were obtained without and with intravenous contrast. CONTRAST:  39m MULTIHANCE GADOBENATE DIMEGLUMINE 529 MG/ML IV SOLN COMPARISON:  None. FINDINGS: Diffusion imaging does not show any acute or subacute infarction. There are chronic small-vessel ischemic changes of the pons. No cerebellar insult. Cerebral hemispheres show generalized atrophy with mild chronic small-vessel disease of the deep white matter. No cortical or large vessel territory infarction. No evidence of primary or metastatic mass lesion, hemorrhage, hydrocephalus or extra-axial collection. There is considerable motion degradation on some of the postcontrast imaging. No pituitary mass. There is an opacified posterior ethmoid air cell on the right. Major vessels at the base of the brain show flow. No skull or skullbase lesion is seen. IMPRESSION: No evidence of acute infarction. No evidence of metastatic disease. Atrophy and chronic small vessel ischemic change, less than often seen in healthy individuals of this age. Electronically Signed   By: MNelson ChimesM.D.   On: 12/14/2015 14:44    ASSESSMENT / PLAN:  Acute Hypoxic Respiratory Failure in setting of post-obstructive PNA and AECOPD-->clinically improved.  Plan Cont abx Taper steroids Wean FIO2 Cont BDs  Large infrahilar RLL lung mass w/ airway narrowing of the RML and RLL bronchus. Associated Lower lobe nodules.  ->  almost certainly a cancer. Poor baseline functional status. Not a great bronch candidate (would consider him high risk) Family leaning towards palliative approach.  Plan Cont heparin for now If we were to proceed would need bronch (would consider this high risk given his poor state) Will meet w/ pt's son today on 3/7. Suspect that they would be open to hospice/palliative approach  over aggressive approach   Delirium. ? Exacerbated by sleep disturbance & systemic steroids Plan Decrease solumedrol Cont supportive care  AS and AF Plan Cont rate control Cont heparin for now Likely resume coumadin 3/7 if no diagnostics to be done    All other issues per primary   Erick Colace ACNP-BC [place] Pager # [phone] OR # [phone] if no answer    12/16/2015,  12/16/2015 8:43 AM   STAFF NOTE: Linwood Dibbles, MD FACP have personally reviewed patient's available data, including medical history, events of note, physical examination and test results as part of my evaluation. I have discussed with resident/NP and other care providers such as pharmacist, RN and RRT. In addition, I personally evaluated patient and elicited key findings of: no distress, slight reduced rt base, discussion today with children and wife, will change solumedrol to decadron in terms of inflammation  / mass at rt main, less mineralocorticoid affect for edema, if for bronch BX standard bronch would do, appears will see grossly on bronch, assess follow up pcxr for collapse / atx risk, if for bronch will need full reversal INR  / PT with of course heparin off, will determine this in afternoon with family discussion, i updated wife and pt in full, he is frail , may be best to treat radiation and palliation  Lavon Paganini. Titus Mould, MD, [place] Pgr: [facility] 12/16/2015 11:05 AM

## 2015-12-17 DIAGNOSIS — Z7189 Other specified counseling: Secondary | ICD-10-CM | POA: Insufficient documentation

## 2015-12-17 DIAGNOSIS — Z515 Encounter for palliative care: Secondary | ICD-10-CM | POA: Insufficient documentation

## 2015-12-17 LAB — CBC
HEMATOCRIT: 32 % — AB (ref 39.0–52.0)
Hemoglobin: 10.6 g/dL — ABNORMAL LOW (ref 13.0–17.0)
MCH: 29.3 pg (ref 26.0–34.0)
MCHC: 33.1 g/dL (ref 30.0–36.0)
MCV: 88.4 fL (ref 78.0–100.0)
Platelets: 149 10*3/uL — ABNORMAL LOW (ref 150–400)
RBC: 3.62 MIL/uL — AB (ref 4.22–5.81)
RDW: 13.1 % (ref 11.5–15.5)
WBC: 7.8 10*3/uL (ref 4.0–10.5)

## 2015-12-17 LAB — PROTIME-INR
INR: 1.65 — AB (ref 0.00–1.49)
PROTHROMBIN TIME: 19.6 s — AB (ref 11.6–15.2)

## 2015-12-17 LAB — HEPARIN LEVEL (UNFRACTIONATED): HEPARIN UNFRACTIONATED: 0.65 [IU]/mL (ref 0.30–0.70)

## 2015-12-17 MED ORDER — PANTOPRAZOLE SODIUM 40 MG PO TBEC: 40.0000 mg | DELAYED_RELEASE_TABLET | Freq: Every day | ORAL | Status: AC

## 2015-12-17 MED ORDER — LEVOFLOXACIN 500 MG PO TABS: 500.0000 mg | ORAL_TABLET | Freq: Every day | ORAL | Status: AC

## 2015-12-17 MED ORDER — GUAIFENESIN-DM 100-10 MG/5ML PO SYRP: 5.0000 mL | ORAL_SOLUTION | ORAL | Status: DC | PRN

## 2015-12-17 MED ORDER — GUAIFENESIN 100 MG/5ML PO SOLN: 5.0000 mL | ORAL | Status: AC | PRN

## 2015-12-17 MED ORDER — ALBUTEROL SULFATE HFA 108 (90 BASE) MCG/ACT IN AERS: 2.0000 | INHALATION_SPRAY | Freq: Four times a day (QID) | RESPIRATORY_TRACT | Status: AC | PRN

## 2015-12-17 MED ORDER — HYDROCODONE-ACETAMINOPHEN 5-325 MG PO TABS: 1.0000 | ORAL_TABLET | ORAL | Status: AC | PRN

## 2015-12-17 MED ORDER — METOPROLOL TARTRATE 25 MG PO TABS: 25.0000 mg | ORAL_TABLET | Freq: Two times a day (BID) | ORAL | Status: AC

## 2015-12-17 MED ORDER — ENOXAPARIN SODIUM 150 MG/ML ~~LOC~~ SOLN: 1.0000 mg/kg | Freq: Two times a day (BID) | SUBCUTANEOUS | Status: AC

## 2015-12-17 MED ORDER — POLYETHYLENE GLYCOL 3350 17 G PO PACK: 17.0000 g | PACK | Freq: Every day | ORAL | Status: AC

## 2015-12-17 MED ORDER — ENOXAPARIN SODIUM 60 MG/0.6ML ~~LOC~~ SOLN
1.0000 mg/kg | Freq: Two times a day (BID) | SUBCUTANEOUS | Status: DC
  Administered 2015-12-17: 60 mg via SUBCUTANEOUS
  Filled 2015-12-17: qty 0.6

## 2015-12-17 MED ORDER — AMLODIPINE BESYLATE 5 MG PO TABS: 5.0000 mg | ORAL_TABLET | Freq: Every day | ORAL | Status: AC

## 2015-12-17 NOTE — Progress Notes (Signed)
NURSING PROGRESS NOTE  [name] [id number] Discharge Data: 12/17/2015 5:21 PM Attending Provider: No att. providers found [id number] [name], MD     [name] to be D/C'd Home per MD order.  Discussed with the patient the After Visit Summary and all questions fully answered. All IV's discontinued with no bleeding noted. All belongings returned to patient for patient to take home.   Last Vital Signs:  Blood pressure 102/61, pulse 87, temperature 98.2 F (36.8 C), temperature source Oral, resp. rate 20, weight 62.007 kg (136 lb 11.2 oz), SpO2 97 %.  Discharge Medication List   Medication List    STOP taking these medications        omeprazole 20 MG capsule  Commonly known as:  PRILOSEC  Replaced by:  pantoprazole 40 MG tablet      TAKE these medications        acetaminophen 500 MG tablet  Commonly known as:  TYLENOL  Take 500 mg by mouth every 6 (six) hours as needed. For pain     albuterol 108 (90 Base) MCG/ACT inhaler  Commonly known as:  PROVENTIL HFA;VENTOLIN HFA  Inhale 2 puffs into the lungs every 6 (six) hours as needed for wheezing or shortness of breath.     amLODipine 5 MG tablet  Commonly known as:  NORVASC  Take 1 tablet (5 mg total) by mouth daily.     aspirin EC 81 MG tablet  Take 81 mg by mouth daily.     brinzolamide 1 % ophthalmic suspension  Commonly known as:  AZOPT  Place 1 drop into both eyes 2 (two) times daily. Reported on 12/11/2015     cholecalciferol 1000 units tablet  Commonly known as:  VITAMIN D  Take 1,000 Units by mouth daily.     dorzolamide 2 % ophthalmic solution  Commonly known as:  TRUSOPT  Place 1 drop into both eyes 2 (two) times daily.     enoxaparin 150 MG/ML injection  Commonly known as:  LOVENOX  Inject 0.41 mLs (60 mg total) into the skin every 12 (twelve) hours.     furosemide 20 MG tablet  Commonly known as:  LASIX  Take 20 mg by mouth daily.     guaiFENesin 100 MG/5ML Soln  Commonly known as:   ROBITUSSIN  Take 5 mLs (100 mg total) by mouth every 4 (four) hours as needed for cough or to loosen phlegm.     HYDROcodone-acetaminophen 5-325 MG tablet  Commonly known as:  NORCO/VICODIN  Take 1-2 tablets by mouth every 4 (four) hours as needed for moderate pain.     latanoprost 0.005 % ophthalmic solution  Commonly known as:  XALATAN  Place 1 drop into both eyes at bedtime.     levofloxacin 500 MG tablet  Commonly known as:  LEVAQUIN  Take 1 tablet (500 mg total) by mouth daily.     LUMIGAN 0.01 % Soln  Generic drug:  bimatoprost  Place 1 drop into both eyes At bedtime.     metoprolol tartrate 25 MG tablet  Commonly known as:  LOPRESSOR  Take 1 tablet (25 mg total) by mouth 2 (two) times daily.     pantoprazole 40 MG tablet  Commonly known as:  PROTONIX  Take 1 tablet (40 mg total) by mouth daily.     polyethylene glycol packet  Commonly known as:  MIRALAX / GLYCOLAX  Take 17 g by mouth daily.     tiotropium 18 MCG inhalation capsule  Commonly known as:  SPIRIVA  Place 18 mcg into inhaler and inhale daily.     warfarin 5 MG tablet  Commonly known as:  COUMADIN  Take as directed by coumadin clinic         Charolette Child, RN

## 2015-12-17 NOTE — Discharge Summary (Addendum)
Physician Discharge Summary  [name] MRN: [id number] DOB/AGE: Nov 16, 1929 82 y.o.  PCP: [name], MD   Admit date: 12/11/2015 Discharge date: 12/17/2015  Discharge Diagnoses:     Active Problems:   S/P AVR   COPD exacerbation (HCC)   Acute respiratory failure with hypoxia (HCC)   Weakness   Left lower quadrant pain   Weakness generalized   Weight loss   Lung mass   PAF (paroxysmal atrial fibrillation) (HCC)   Protein-calorie malnutrition, severe   Mass of lung    Follow-up recommendations  patient is being discharged home with hospice Needs home health for Lovenox injections, daily INR checks, goal INR 2.5-3.0,  DC Lovenox injections once INR is 2.5     Medication List    STOP taking these medications        omeprazole 20 MG capsule  Commonly known as:  PRILOSEC  Replaced by:  pantoprazole 40 MG tablet      TAKE these medications        acetaminophen 500 MG tablet  Commonly known as:  TYLENOL  Take 500 mg by mouth every 6 (six) hours as needed. For pain     albuterol 108 (90 Base) MCG/ACT inhaler  Commonly known as:  PROVENTIL HFA;VENTOLIN HFA  Inhale 2 puffs into the lungs every 6 (six) hours as needed for wheezing or shortness of breath.     amLODipine 5 MG tablet  Commonly known as:  NORVASC  Take 1 tablet (5 mg total) by mouth daily.     aspirin EC 81 MG tablet  Take 81 mg by mouth daily.     brinzolamide 1 % ophthalmic suspension  Commonly known as:  AZOPT  Place 1 drop into both eyes 2 (two) times daily. Reported on 12/11/2015     cholecalciferol 1000 units tablet  Commonly known as:  VITAMIN D  Take 1,000 Units by mouth daily.     dorzolamide 2 % ophthalmic solution  Commonly known as:  TRUSOPT  Place 1 drop into both eyes 2 (two) times daily.     enoxaparin 150 MG/ML injection  Commonly known as:  LOVENOX  Inject 0.41 mLs (60 mg total) into the skin every 12 (twelve) hours.     furosemide 20 MG tablet  Commonly known  as:  LASIX  Take 20 mg by mouth daily.     guaiFENesin-dextromethorphan 100-10 MG/5ML syrup  Commonly known as:  ROBITUSSIN DM  Take 5 mLs by mouth every 4 (four) hours as needed for cough.     HYDROcodone-acetaminophen 5-325 MG tablet  Commonly known as:  NORCO/VICODIN  Take 1-2 tablets by mouth every 4 (four) hours as needed for moderate pain.     latanoprost 0.005 % ophthalmic solution  Commonly known as:  XALATAN  Place 1 drop into both eyes at bedtime.     levofloxacin 500 MG tablet  Commonly known as:  LEVAQUIN  Take 1 tablet (500 mg total) by mouth daily.     LUMIGAN 0.01 % Soln  Generic drug:  bimatoprost  Place 1 drop into both eyes At bedtime.     metoprolol tartrate 25 MG tablet  Commonly known as:  LOPRESSOR  Take 1 tablet (25 mg total) by mouth 2 (two) times daily.     pantoprazole 40 MG tablet  Commonly known as:  PROTONIX  Take 1 tablet (40 mg total) by mouth daily.     polyethylene glycol packet  Commonly known as:  MIRALAX / GLYCOLAX  Take 17 g by mouth daily.     tiotropium 18 MCG inhalation capsule  Commonly known as:  SPIRIVA  Place 18 mcg into inhaler and inhale daily.     warfarin 5 MG tablet  Commonly known as:  COUMADIN  Take as directed by coumadin clinic         Discharge Condition: Stable   Discharge Instructions       Discharge Instructions    Diet - low sodium heart healthy    Complete by:  As directed      Increase activity slowly    Complete by:  As directed            Allergies  Allergen Reactions  . Penicillins Anaphylaxis and Rash      Disposition: 01-Home or Self Care   Consults:  Pulmonary Cardiology     Significant Diagnostic Studies:  Dg Chest 2 View  12/11/2015  CLINICAL DATA:  Shortness of breath, weakness and cough for a while, some blood in stool, smoker, COPD, emphysema, coronary disease post bypass, hypertension EXAM: CHEST  2 VIEW COMPARISON:  12/09/2015 FINDINGS: Normal size of cardiac  silhouette post AVR. Atherosclerotic calcification aorta. Question enlarged central pulmonary arteries which could reflect pulmonary arterial hypertension. Emphysematous and bronchitic changes consistent with COPD. Chronic interstitial lung disease changes in the mid to lower lungs bilaterally similar to previous exam likely representing fibrosis. No definite superimposed acute infiltrate, pleural effusion or pneumothorax. Bones diffusely demineralized. IMPRESSION: Post AVR. COPD changes with chronic interstitial disease/pulmonary fibrosis. No acute abnormalities. Electronically Signed   By: Lavonia Dana M.D.   On: 12/11/2015 13:55   Dg Chest 2 View  12/09/2015  CLINICAL DATA:  Weight loss, severe emphysema and pulmonary fibrosis. Status post aortic valve replacement. EXAM: CHEST  2 VIEW COMPARISON:  60 in 2015, 03/21/2014 FINDINGS: Chronic background severe emphysema pattern and mid and lower lung interstitial fibrosis. Interval resolution of the previous right upper lobe spiculated airspace opacity, compatible with infectious/inflammatory process. No current superimposed edema or pneumonia. No effusion or pneumothorax. Trachea midline. Prior coronary bypass also evident as well as aortic valve replacement. Atherosclerosis of the aorta. Chronic appearing mid thoracic mild compression fracture. IMPRESSION: Chronic background severe emphysema and mid and lower lung interstitial fibrosis. Interval resolution of the right upper lobe focal spiculated airspace process. Suspect infectious/inflammatory process. No current acute process. Electronically Signed   By: Jerilynn Mages.  Shick M.D.   On: 12/09/2015 15:13   Ct Chest Wo Contrast  12/12/2015  CLINICAL DATA:  Shortness breath, nonproductive cough. History of pulmonary fibrosis. EXAM: CT CHEST WITHOUT CONTRAST TECHNIQUE: Multidetector CT imaging of the chest was performed following the standard protocol without IV contrast. COMPARISON:  Chest radiographs dated 12/11/2015. CT  chest dated 03/21/2014. FINDINGS: Mediastinum/Nodes: The heart is normal in size. No pericardial effusion. Fluid in the superior pericardial recess. Coronary atherosclerosis. Postsurgical changes related to prior CABG. Prosthetic aortic valve. Atherosclerotic calcifications of the aortic arch. 4.6 cm ascending thoracic aortic aneurysm (series 2/image 38). 12 mm short axis right paratracheal node (series 2/ image 26), previously 10 mm. Lungs/Pleura: 4.0 x 2.9 x 4.3 cm infrahilar/ central right lower lobe mass narrowing the right middle lobe and right lower lobe bronchus, suspicious for primary bronchogenic neoplasm. 16 x 9 mm subpleural nodule in the medial right lower lobe (series 3/ image 38), new. 9 mm nodule in the superior segment left lower lobe (series 3/image 36), new. Additional mild patchy/nodular opacity in the superior segment right lower lobe (series  3/image 31), favored to reflect postobstructive opacity. Lower lobe predominant bronchiectasis with bronchial wall thickening. Associated subpleural reticulation/ fibrosis in the bilateral lower lobes. Trace right pleural effusion. Underlying moderate centrilobular and paraseptal emphysematous changes. No pneumothorax. Upper abdomen: Hyperdense left adrenal nodule (series 2/image 60), incompletely visualized, chronic. Right upper pole renal cyst (series 2/image 60), incompletely visualized, chronic. Musculoskeletal: Degenerative changes of the visualized thoracolumbar spine. Mild wedging of three mid thoracic vertebral bodies (series 5/image 59), T6-T8, mildly progressed since 2015. IMPRESSION: 4.3 cm right infrahilar/central right lower lobe mass, suspicious for primary bronchogenic neoplasm. Associated bilateral lower lobe pulmonary nodules, suspicious for metastases. Consider bronchoscopy for tissue confirmation. PET-CT may be useful for staging. Electronically Signed   By: Julian Hy M.D.   On: 12/12/2015 16:01   Mr Jeri Cos ZO  Contrast  12/14/2015  CLINICAL DATA:  Acute presentation with generalized weakness. Recent diagnosis of lung mass. EXAM: MRI HEAD WITHOUT AND WITH CONTRAST TECHNIQUE: Multiplanar, multiecho pulse sequences of the brain and surrounding structures were obtained without and with intravenous contrast. CONTRAST:  33m MULTIHANCE GADOBENATE DIMEGLUMINE 529 MG/ML IV SOLN COMPARISON:  None. FINDINGS: Diffusion imaging does not show any acute or subacute infarction. There are chronic small-vessel ischemic changes of the pons. No cerebellar insult. Cerebral hemispheres show generalized atrophy with mild chronic small-vessel disease of the deep white matter. No cortical or large vessel territory infarction. No evidence of primary or metastatic mass lesion, hemorrhage, hydrocephalus or extra-axial collection. There is considerable motion degradation on some of the postcontrast imaging. No pituitary mass. There is an opacified posterior ethmoid air cell on the right. Major vessels at the base of the brain show flow. No skull or skullbase lesion is seen. IMPRESSION: No evidence of acute infarction. No evidence of metastatic disease. Atrophy and chronic small vessel ischemic change, less than often seen in healthy individuals of this age. Electronically Signed   By: MNelson ChimesM.D.   On: 12/14/2015 14:44   Dg Chest Port 1 View  12/16/2015  CLINICAL DATA:  Lung mass EXAM: PORTABLE CHEST 1 VIEW COMPARISON:  12/11/2015 FINDINGS: Cardiac shadow is stable. Postsurgical changes are again seen. The known right hilar lung mass again blends with the hilar vascularity and is incompletely evaluated. Interstitial changes are noted within both lungs without focal infiltrate. IMPRESSION: No acute abnormality is noted. The known right hilar lung mass blends with the hilar vasculature. Electronically Signed   By: MInez CatalinaM.D.   On: 12/16/2015 12:52    2-D echo LV EF: 60% -  65%  ------------------------------------------------------------------- Indications: Atrial fibrillation - 427.31.  ------------------------------------------------------------------- History: PMH: Aortic valve disorder. Emphysema. Coronary artery disease. Chronic obstructive pulmonary disease.  ------------------------------------------------------------------- Study Conclusions  - Left ventricle: The cavity size was normal. Wall thickness was  normal. Systolic function was normal. The estimated ejection  fraction was in the range of 60% to 65%.  Impressions:  - This is a very technically difficult echo with imcomplete view.  Contrast was not used.  The LV function appears to be well preserved but I would suggest  Definity contrast if speficic wall motion evaluation is needed.  Filed Weights   12/15/15 0440 12/16/15 0634 12/17/15 0633  Weight: 62.869 kg (138 lb 9.6 oz) 63.05 kg (139 lb) 62.007 kg (136 lb 11.2 oz)     Microbiology: No results found for this or any previous visit (from the past 240 hour(s)).     Blood Culture No results found for: SDES, [place] CULT, REPTSTATUS  Labs: Results for orders placed or performed during the hospital encounter of 12/11/15 (from the past 48 hour(s))  Heparin level (unfractionated)     Status: Abnormal   Collection Time: 12/15/15  4:30 PM  Result Value Ref Range   Heparin Unfractionated 0.10 (L) 0.30 - 0.70 IU/mL    Comment:        IF HEPARIN RESULTS ARE BELOW EXPECTED VALUES, AND PATIENT DOSAGE HAS BEEN CONFIRMED, SUGGEST FOLLOW UP TESTING OF ANTITHROMBIN III LEVELS.   CBC     Status: Abnormal   Collection Time: 12/16/15  2:24 AM  Result Value Ref Range   WBC 11.1 (H) 4.0 - 10.5 K/uL   RBC 3.78 (L) 4.22 - 5.81 MIL/uL   Hemoglobin 10.7 (L) 13.0 - 17.0 g/dL   HCT 33.1 (L) 39.0 - 52.0 %   MCV 87.6 78.0 - 100.0 fL   MCH 28.3 26.0 - 34.0 pg   MCHC 32.3 30.0 - 36.0 g/dL   RDW 13.0 11.5 - 15.5  %   Platelets 180 150 - 400 K/uL  Heparin level (unfractionated)     Status: Abnormal   Collection Time: 12/16/15  2:24 AM  Result Value Ref Range   Heparin Unfractionated 0.24 (L) 0.30 - 0.70 IU/mL    Comment:        IF HEPARIN RESULTS ARE BELOW EXPECTED VALUES, AND PATIENT DOSAGE HAS BEEN CONFIRMED, SUGGEST FOLLOW UP TESTING OF ANTITHROMBIN III LEVELS.   Protime-INR     Status: Abnormal   Collection Time: 12/16/15  2:24 AM  Result Value Ref Range   Prothrombin Time 23.3 (H) 11.6 - 15.2 seconds   INR 2.09 (H) 0.00 - 1.49  Heparin level (unfractionated)     Status: None   Collection Time: 12/16/15 12:37 PM  Result Value Ref Range   Heparin Unfractionated 0.55 0.30 - 0.70 IU/mL    Comment:        IF HEPARIN RESULTS ARE BELOW EXPECTED VALUES, AND PATIENT DOSAGE HAS BEEN CONFIRMED, SUGGEST FOLLOW UP TESTING OF ANTITHROMBIN III LEVELS.   Heparin level (unfractionated)     Status: None   Collection Time: 12/16/15  9:02 PM  Result Value Ref Range   Heparin Unfractionated 0.54 0.30 - 0.70 IU/mL    Comment:        IF HEPARIN RESULTS ARE BELOW EXPECTED VALUES, AND PATIENT DOSAGE HAS BEEN CONFIRMED, SUGGEST FOLLOW UP TESTING OF ANTITHROMBIN III LEVELS.   CBC     Status: Abnormal   Collection Time: 12/17/15  5:00 AM  Result Value Ref Range   WBC 7.8 4.0 - 10.5 K/uL   RBC 3.62 (L) 4.22 - 5.81 MIL/uL   Hemoglobin 10.6 (L) 13.0 - 17.0 g/dL   HCT 32.0 (L) 39.0 - 52.0 %   MCV 88.4 78.0 - 100.0 fL   MCH 29.3 26.0 - 34.0 pg   MCHC 33.1 30.0 - 36.0 g/dL   RDW 13.1 11.5 - 15.5 %   Platelets 149 (L) 150 - 400 K/uL  Heparin level (unfractionated)     Status: None   Collection Time: 12/17/15  5:00 AM  Result Value Ref Range   Heparin Unfractionated 0.65 0.30 - 0.70 IU/mL    Comment:        IF HEPARIN RESULTS ARE BELOW EXPECTED VALUES, AND PATIENT DOSAGE HAS BEEN CONFIRMED, SUGGEST FOLLOW UP TESTING OF ANTITHROMBIN III LEVELS.   Protime-INR     Status: Abnormal   Collection  Time: 12/17/15  5:00 AM  Result Value Ref Range  Prothrombin Time 19.6 (H) 11.6 - 15.2 seconds   INR 1.65 (H) 0.00 - 1.49     Lipid Panel  No results found for: CHOL, TRIG, HDL, CHOLHDL, VLDL, LDLCALC, LDLDIRECT   No results found for: HGBA1C   Lab Results  Component Value Date   CREATININE 1.10 12/15/2015     Brief summary 82 y.o. male with a hx of CAD, aortic stenosis, s/p mechanical [employer] Jude AVR + 1v CABG (S-RCA) in 2001, ILD, coumadin rx. Myoview (12/06): Normal, EF 70%. Echo (02/2012): EF 60-65%, mechanical AVR ok with mean gradient 11 mmHg. Last seen by me in 03/2013. He continues to note chronic DOE. This is unchanged. Previously with NYHA Class 2b symptoms. No orthopnea, PND, edema. Presents to the ER with shortness of breath and weakness, patient noted getting weaker and having more shortness of breath on Tuesday, also has a nonproductive cough. Chest x-ray shows pulmonary fibrosis and chronic interstitial disease. EKG shows new onset atrial fibrillation. Patient also found to have a new lung mass this admission, pulmonary consulted     Assessment and plan Acute respiratory failure with hypoxia likely secondary to COPD exacerbation. CXR without any acute findings or suggestion of heart failure. CT chest consistent with infrahilar central right lower lobe mass -Continue 02 via [place] to maintain sats 88-92%. Currently requiring 4 liters. Patient was not on oxygen at home. ABG shows pH of 7.37, PCO2 36.7, PO2 of 76.0 -Scheduled Duonebs Q4 hours with Albuterol q2 prn continue Levaquin for COPD exacerbation ,4 more days  rx with Solumedrol '60mg'$  IV BID, changed to Decadron by pulmonary, no wheezing , steroids DC prior to discharge  -H2 blocker or continue home PPI CT of the chest to rule out interstitial lung disease shows a new lung mass,   Atrial fibrillation, rate controlled.Chadvasc 4.  This seems to be new onset A-fib or paroxysmal at best Cardiology consulted ,  12/2015 with LVEF 60-65%. Echo 03/2014 LVEF 55-60%, normal functioning AVR -on coumadin for mechanical valve, goal 2.5-3.5, transitioned to heparin bridge in anticipation of needing lung biopsy Currently patient is back in NSR , continue lopressor '25mg'$  bid. He is already anticoagulated for his mechanical AVR, continue.  Patient started on Lovenox subcutaneous injections pending improvement in INR, goal INR for him would be 2.5-3.5  Lung mass - large 4.3 cm right sided mass suspicious for malignancy. Workup per primary team, if need to hold coumadin for invasive testing would bridge with heparin gtt.  Pulmonary consult requested , family has agreed to not pursue any treatment for lung cancer MRI of the brain negative for metastasis Patient is being discharged home with hospice, home oxygen    Aortic valve replacement - mechanical [employer] Judes. On chronic coumadin. INR supratherapeutic . Pharmacy consulted for coumadin management , bridged with heparin  , goal INR 2.5-3.0  However since no procedures are planned, patient is being placed back on his Coumadin with Lovenox bridge which can be done at home, INR was 1.65 prior to discharge Patient would need daily INR checks Please discontinue Lovenox injections when INR is greater than 2.5   Weakness / Protein calorie malnutrition with weight loss related to chronic illness, malnutrition -PT evaluation recommended home health -Dietician consult -continue Ensure for now   Left lower quadrant pain. Really more of a discomfort over the last 2 weeks. Son gives history of recent constipation which sounds unresolved despite a laxative at home. continue MiraLAX as tolerated.      Discharge Exam  Blood pressure 128/75, pulse 87, temperature 97.7 F (36.5 C), temperature source Oral, resp. rate 18, weight 62.007 kg (136 lb 11.2 oz), SpO2 97 %.      Follow-up Information    Follow up with [facility].   Specialty:  [facility] information:   [place] [id number] [phone]       Signed: Reyne Dumas 12/17/2015, 11:17 AM        Time spent >45 mins  The patient is being discharged home with hospice

## 2015-12-17 NOTE — Consult Note (Signed)
Consultation Note Date: 12/17/2015   Patient Name: [name]  DOB: 12/24/1929  MRN: [id number]  Age / Sex: 82 y.o., male  PCP: Dione Housekeeper, MD Referring Physician: Reyne Dumas, MD  Reason for Consultation: Establishing goals of care    Clinical Assessment/Narrative:  Patient is a 82 year old [place] with a past medical history significant for coronary artery disease, aortic stenosis, history of mechanical St. Jude aortic valve replacement. Patient also history of CABG, interstitial lung disease, patient is on oral anticoagulation with Coumadin. Patient arrived with chronic dyspnea on exertion. Patient arrived with shortness of breath ongoing weakness. Chest x-ray showed pulmonary fibrosis and chronic interstitial disease. During this hospitalization, also diagnosed with new onset atrial fibrillation. Additional workup in this hospitalization with CT scan findings of lung mass. Patient was found to have a large 4.3 cm right-sided mass.  Family meeting held with pulmonary service and the patient's family. Discussed with patient's wife, son, 2 daughters. Final recommendations were for home with hospice and no further workup of lung mass. Subsequently, palliative care consult it.  Patient is an elderly gentleman resting in bed. He is a little hard of hearing. He does not have any acute symptoms. He denies shortness of breath. Patient's wife Mrs. Bolger is present at the bedside. She has had initial conversations with home hospice agency. She states she has adequate help at home. All of her children live locally, one son lives close by. Discussions held with the patient's wife about all of the medical conditions discussed above. Discussed about having hospice at home to assist with symptom management. Discussed about possibly shortened prognosis/life expectancy. All questions answered.    Contacts/Participants in  Discussion: Primary Decision Maker:     Relationship to Patient  wife HCPOA: yes     SUMMARY OF RECOMMENDATIONS DO NOT RESUSCITATE/DO NOT INTUBATE Home with hospice Patient will need medical equipment-hospital bed Goals of care to focus on comfort in the home setting with hospice. No additional workup for the lung mass is deemed appropriate.  Code Status/Advance Care Planning: DNR    Code Status Orders        Start     Ordered   12/16/15 1611  Do not attempt resuscitation (DNR)   Continuous     12/16/15 1610    Code Status History    Date Active Date Inactive Code Status Order ID Comments User Context   12/11/2015  4:39 PM 12/16/2015  4:10 PM Partial Code [id number]  Willia Craze, NP ED   03/20/2014 11:28 PM 03/23/2014  3:28 PM Full Code [id number]  Rise Patience, MD Inpatient   01/28/2012  6:30 PM 02/04/2012  9:06 PM Full Code [id number]  Lisabeth Pick Ward Scronce, RN Inpatient      Other Directives:Other  Symptom Management:    As above  Palliative Prophylaxis:   Bowel Regimen  Additional Recommendations (Limitations, Scope, Preferences):  Full Comfort Care     Psycho-social/Spiritual:  Support System: [place] for further Chaplaincy support:no Additional Recommendations: Education on Hospice  Prognosis: < 2-4 weeks  Discharge Planning: Home with Hospice   Chief Complaint/ Primary Diagnoses: Present on Admission:  . COPD exacerbation ([place]) . Acute respiratory failure with hypoxia ([place])  I have reviewed the medical record, interviewed the patient and family, and examined the patient. The following aspects are pertinent.  Past Medical History  Diagnosis Date  . CORONARY ATHEROSCLEROSIS NATIVE CORONARY ARTERY 04/23/2009  . DRY EYE SYNDROME 10/09/2008  . COPD 10/09/2008  . CAD, ARTERY BYPASS  GRAFT 10/09/2008  . Aortic valve disorder 12/07/2010  . Palpitations   . Emphysema   . HOH (hard of hearing)    Social History   Social History  . Marital  Status: Married    Spouse Name: N/A  . Number of Children: N/A  . Years of Education: N/A   Occupational History  . retired    Social History Main Topics  . Smoking status: Former Smoker -- 2.00 packs/day for 15 years    Types: Cigarettes    Quit date: 10/12/1999  . Smokeless tobacco: Never Used  . Alcohol Use: No  . Drug Use: No  . Sexual Activity: No   Other Topics Concern  . None   Social History Narrative   Pt lives with wife. Married since July 1957.  Son lives nearby.   Family History  Problem Relation Age of Onset  . Heart attack Father   . Stroke Mother   . Hypertension    . Cancer    . Colon cancer Brother   . Emphysema Brother    Scheduled Meds: . amLODipine  5 mg Oral Daily  . aspirin EC  81 mg Oral Daily  . dexamethasone  6 mg Intravenous Q12H  . dorzolamide  1 drop Both Eyes BID  . enoxaparin (LOVENOX) injection  1 mg/kg Subcutaneous Q12H  . feeding supplement (ENSURE ENLIVE)  237 mL Oral BID BM  . ipratropium-albuterol  3 mL Nebulization TID  . latanoprost  1 drop Both Eyes QHS  . levofloxacin  500 mg Oral Daily  . metoprolol  25 mg Oral BID  . pantoprazole  40 mg Oral Daily  . polyethylene glycol  17 g Oral Daily  . sodium chloride flush  3 mL Intravenous Q12H   Continuous Infusions:  PRN Meds:.acetaminophen **OR** acetaminophen, HYDROcodone-acetaminophen, levalbuterol, ondansetron **OR** ondansetron (ZOFRAN) IV Medications Prior to Admission:  Prior to Admission medications   Medication Sig Start Date End Date Taking? Authorizing Provider  acetaminophen (TYLENOL) 500 MG tablet Take 500 mg by mouth every 6 (six) hours as needed. For pain    Yes Historical Provider, MD  aspirin EC 81 MG tablet Take 81 mg by mouth daily.   Yes Historical Provider, MD  cholecalciferol (VITAMIN D) 1000 UNITS tablet Take 1,000 Units by mouth daily.   Yes Historical Provider, MD  dorzolamide (TRUSOPT) 2 % ophthalmic solution Place 1 drop into both eyes 2 (two) times  daily.   Yes Historical Provider, MD  furosemide (LASIX) 20 MG tablet Take 20 mg by mouth daily.   Yes Historical Provider, MD  latanoprost (XALATAN) 0.005 % ophthalmic solution Place 1 drop into both eyes at bedtime.   Yes Historical Provider, MD  LUMIGAN 0.01 % SOLN Place 1 drop into both eyes At bedtime. 12/15/10  Yes Historical Provider, MD  metoprolol (LOPRESSOR) 50 MG tablet TAKE ONE-HALF TABLET BY MOUTH ONCE DAILY Patient taking differently: TAKE ONE-HALF (12.'5mg'$ ) TABLET BY MOUTH ONCE DAILY 12/03/15  Yes [name], MD  omeprazole (PRILOSEC) 20 MG capsule Take 20 mg by mouth daily.   Yes Historical Provider, MD  tiotropium (SPIRIVA) 18 MCG inhalation capsule Place 18 mcg into inhaler and inhale daily.     Yes Historical Provider, MD  warfarin (COUMADIN) 5 MG tablet Take as directed by coumadin clinic Patient taking differently: Take 5 mg by mouth daily at 6 PM. Take as directed by coumadin clinic 07/25/15  Yes [name], MD  albuterol (PROVENTIL HFA;VENTOLIN HFA) 108 (90 Base) MCG/ACT  inhaler Inhale 2 puffs into the lungs every 6 (six) hours as needed for wheezing or shortness of breath. 12/17/15   Reyne Dumas, MD  amLODipine (NORVASC) 5 MG tablet Take 1 tablet (5 mg total) by mouth daily. 12/17/15   Reyne Dumas, MD  brinzolamide (AZOPT) 1 % ophthalmic suspension Place 1 drop into both eyes 2 (two) times daily. Reported on 12/11/2015    Historical Provider, MD  enoxaparin (LOVENOX) 150 MG/ML injection Inject 0.41 mLs (60 mg total) into the skin every 12 (twelve) hours. 12/17/15   Reyne Dumas, MD  guaiFENesin (ROBITUSSIN) 100 MG/5ML SOLN Take 5 mLs (100 mg total) by mouth every 4 (four) hours as needed for cough or to loosen phlegm. 12/17/15   Reyne Dumas, MD  HYDROcodone-acetaminophen (NORCO/VICODIN) 5-325 MG tablet Take 1-2 tablets by mouth every 4 (four) hours as needed for moderate pain. 12/17/15   Reyne Dumas, MD  levofloxacin (LEVAQUIN) 500 MG tablet Take 1 tablet (500 mg total) by mouth  daily. 12/17/15   Reyne Dumas, MD  metoprolol tartrate (LOPRESSOR) 25 MG tablet Take 1 tablet (25 mg total) by mouth 2 (two) times daily. 12/17/15   Reyne Dumas, MD  pantoprazole (PROTONIX) 40 MG tablet Take 1 tablet (40 mg total) by mouth daily. 12/17/15   Reyne Dumas, MD  polyethylene glycol (MIRALAX / GLYCOLAX) packet Take 17 g by mouth daily. 12/17/15   Reyne Dumas, MD   Allergies  Allergen Reactions  . Penicillins Anaphylaxis and Rash    Review of Systems Positive for weakness, occasional shortness of breath Physical Exam Weak frail elderly gentleman resting in bed Hard of hearing Diminished S1-S2 Abdomen soft No edema Awake alert somewhat confused. Answers a few questions appropriately. Vital Signs: BP 128/75 mmHg  Pulse 87  Temp(Src) 97.7 F (36.5 C) (Oral)  Resp 18  Wt 62.007 kg (136 lb 11.2 oz)  SpO2 97%  SpO2: SpO2: 97 % O2 Device:SpO2: 97 % O2 Flow Rate: .O2 Flow Rate (L/min): 3 L/min  IO: Intake/output summary:  Intake/Output Summary (Last 24 hours) at 12/17/15 1351 Last data filed at 12/17/15 3818  Gross per 24 hour  Intake 267.67 ml  Output    420 ml  Net -152.33 ml    LBM: Last BM Date: 12/16/15 Baseline Weight: Weight: 61.961 kg (136 lb 9.6 oz) Most recent weight: Weight: 62.007 kg (136 lb 11.2 oz)      Palliative Assessment/Data:  Flowsheet Rows        Most Recent Value   Intake Tab    Referral Department  Hospitalist   Unit at Time of Referral  Med/Surg Unit   Palliative Care Primary Diagnosis  Cancer   Date Notified  12/16/15   Palliative Care Type  New Palliative care   Reason Not Seen  Discharged before seen   Reason for referral  Non-pain Symptom, Counsel Regarding Hospice   Date of Admission  12/11/15   Date first seen by Palliative Care  12/17/15   # of days IP prior to Palliative referral  5   Clinical Assessment    Palliative Performance Scale Score  30%   Pain Max last 24 hours  4   Pain Min Last 24 hours  3   Dyspnea Max Last 24  Hours  3   Dyspnea Min Last 24 hours  2   Psychosocial & Spiritual Assessment    Palliative Care Outcomes    Patient/Family meeting held?  Yes   Who was at the meeting?  patient and  wife    Palliative Care Outcomes  Clarified goals of care, Counseled regarding hospice   Palliative Care follow-up planned  No   Actual Discharge Date  12/17/15 [home with hospice]      Additional Data Reviewed:  CBC:    Component Value Date/Time   WBC 7.8 12/17/2015 0500   HGB 10.6* 12/17/2015 0500   HCT 32.0* 12/17/2015 0500   PLT 149* 12/17/2015 0500   MCV 88.4 12/17/2015 0500   NEUTROABS 9.0* 03/21/2014 0521   LYMPHSABS 0.5* 03/21/2014 0521   MONOABS 0.5 03/21/2014 0521   EOSABS 0.0 03/21/2014 0521   BASOSABS 0.0 03/21/2014 0521   Comprehensive Metabolic Panel:    Component Value Date/Time   NA 137 12/15/2015 0543   K 4.4 12/15/2015 0543   CL 100* 12/15/2015 0543   CO2 29 12/15/2015 0543   BUN 32* 12/15/2015 0543   CREATININE 1.10 12/15/2015 0543   GLUCOSE 141* 12/15/2015 0543   CALCIUM 9.1 12/15/2015 0543   AST 28 12/13/2015 0614   ALT 17 12/13/2015 0614   ALKPHOS 115 12/13/2015 0614   BILITOT 0.7 12/13/2015 0614   PROT 6.8 12/13/2015 0614   ALBUMIN 2.8* 12/13/2015 0614     Time In:  12 Time Out:  1 Time Total:  60 Greater than 50%  of this time was spent counseling and coordinating care related to the above assessment and plan.  Signed by: Loistine Chance, MD [phone] Loistine Chance, MD  12/17/2015, 1:51 PM  Please contact Palliative Medicine Team phone at [phone] for questions and concerns.

## 2015-12-17 NOTE — Progress Notes (Signed)
Nutrition Follow-up  DOCUMENTATION CODES:   Underweight, Severe malnutrition in context of chronic illness  INTERVENTION:   -Continue Ensure Enlive po BID, each supplement provides 350 kcal and 20 grams of protein  NUTRITION DIAGNOSIS:   Malnutrition related to chronic illness as evidenced by severe depletion of body fat, severe depletion of muscle mass.  Ongoing  GOAL:   Patient will meet greater than or equal to 90% of their needs  Progressing  MONITOR:   PO intake, Supplement acceptance, Labs, Weight trends  REASON FOR ASSESSMENT:   Consult Assessment of nutrition requirement/status  ASSESSMENT:   Pt with a history of CAD, aortic stenosis status post mechanical St. Jude AVR on coumadin. He also has a history of COPD, not on home oxygen. Son at bedside, helps with history. Over the last 2 weeks patient has had a poor appetite. The has become progressively weaker. Patient has chronic dyspnea and a chronic cough. Lately his cough is become worse, he coughed up a scant amount of blood this morning but otherwise cough is mainly nonproductive. His chronic shortness of breath became much worse yesterday. No fevers. No chest pain. Per EDP report patient initially hypoxic with sats in the nineties. Sats now in 90s after solumedrol by EMS, breathing treatments and 4 liters of 02 per [place].  [name] with MD at time of visit.   Per MD notes, CT of chest revealed RLL lung mass. Per MD notes, pt with newly diagnosed metastatic lung cancer. Pt and family agree to no further work-up and treatments. Palliative care has seen pt; plan to transition pt to home with hospice services, with main goal of comfort based care.   Pt remains on a heart healthy diet, consuming 50-100% of meals per doc flowsheets. Pt is also accepting his Ensure supplements. RD will continue for comfort.   Labs reviewed.   Diet Order:  Diet Heart Room service appropriate?: Yes; Fluid consistency:: Thin Diet - low sodium  heart healthy  Skin:  Reviewed, no issues  Last BM:  12/16/15  Height:   Ht Readings from Last 1 Encounters:  09/01/15 '6\' 2"'$  (1.88 m)    Weight:   Wt Readings from Last 1 Encounters:  12/17/15 136 lb 11.2 oz (62.007 kg)    Ideal Body Weight:  86.3 kg  BMI:  Body mass index is 17.54 kg/(m^2).  Estimated Nutritional Needs:   Kcal:  1600-1800  Protein:  65-80 grams  Fluid:  1.6-1.8 L  EDUCATION NEEDS:   No education needs identified at this time  [name] [name], RD, LDN, CDE Pager: [phone] After hours Pager: [phone]

## 2015-12-17 NOTE — Progress Notes (Signed)
ANTICOAGULATION CONSULT NOTE - Initial Consult  Pharmacy Consult for Lovenox from heparin Indication: atrial fibrillation and mechanical AVR  Allergies  Allergen Reactions  . Penicillins Anaphylaxis and Rash    Patient Measurements: Weight: 136 lb 11.2 oz (62.007 kg) Heparin Dosing Weight: 63 kg  Vital Signs: Temp: 97.7 F (36.5 C) (03/08 0633) Temp Source: Oral (03/08 4401) BP: 128/75 mmHg (03/08 1000) Pulse Rate: 87 (03/08 1000)  Labs:  Recent Labs  12/15/15 0543  12/16/15 0224 12/16/15 1237 12/16/15 2102 12/17/15 0500  HGB 11.0*  --  10.7*  --   --  10.6*  HCT 33.1*  --  33.1*  --   --  32.0*  PLT 159  --  180  --   --  149*  LABPROT 26.4*  --  23.3*  --   --  19.6*  INR 2.46*  --  2.09*  --   --  1.65*  HEPARINUNFRC  --   < > 0.24* 0.55 0.54 0.65  CREATININE 1.10  --   --   --   --   --   < > = values in this interval not displayed.  Estimated Creatinine Clearance: 44.6 mL/min (by C-G formula based on Cr of 1.1).   Assessment: 34 YOM with lung cancer on warfarin PTA for mechanical AVR and now with transient Afib. Warfarin is on hold for possible bronchoscopy. Now changing patient to Lovenox to go home with hospice.   Goal of Therapy:  Monitor platelets by anticoagulation protocol: Yes   Plan:  1. Discontinue heparin gtt 2. Start Lovenox 1 mg/kg (60 mg) q 12 hours, starting one hour after heparin infusion stopped 3. Will continue to monitor for any signs/symptoms of bleeding  Thank you for allowing [place] to participate in this patients care. Jens Som, PharmD Pager: [phone]  12/17/2015 11:03 AM

## 2015-12-17 NOTE — Care Management (Signed)
CM spoke with pt/wife regarding d/c plan. [facility] CM reached out to [facility] / Bambi @ [phone] and make her aware pt is to d/c today.CM shared with Bambi wife is requesting hospital bed. Bambi stated once pt/wife has arrived home bed will be delivered. Information shared with pt/wife by CM. CM faxed order, face to face, H&P, and discharge summary to [facility] @ [phone].

## 2015-12-25 ENCOUNTER — Telehealth: Payer: Self-pay | Admitting: Cardiovascular Disease

## 2015-12-25 NOTE — Telephone Encounter (Signed)
New Message:    Pt passed away this morning at 6:53-she said she would love to talk to Dr Johnsie Cancel.

## 2015-12-25 NOTE — Telephone Encounter (Signed)
SPOKE WITH PT'S DAUGHTER  WILL FORWARD  MESSAGE TO  DR Johnsie Cancel  SO  HE  KNOWS OF PT'S PASSING ./CY

## 2016-01-10 DEATH — deceased

## 2016-03-29 ENCOUNTER — Ambulatory Visit: Payer: Self-pay | Admitting: Cardiovascular Disease

## 2016-03-29 NOTE — Progress Notes (Signed)
Pt is deceased.
